# Patient Record
Sex: Female | Born: 1950 | Race: Black or African American | Hispanic: No | Marital: Single | Smoking: Never smoker
Health system: Southern US, Community
[De-identification: ages and names within clinical notes are randomized; demographics above are authoritative.]

## PROBLEM LIST (undated history)

## (undated) DIAGNOSIS — J302 Other seasonal allergic rhinitis: Secondary | ICD-10-CM

## (undated) DIAGNOSIS — I10 Essential (primary) hypertension: Secondary | ICD-10-CM

## (undated) DIAGNOSIS — E669 Obesity, unspecified: Secondary | ICD-10-CM

## (undated) DIAGNOSIS — I4891 Unspecified atrial fibrillation: Secondary | ICD-10-CM

## (undated) HISTORY — PX: ABLATION: SHX5711

---

## 2011-07-05 DIAGNOSIS — R002 Palpitations: Secondary | ICD-10-CM | POA: Insufficient documentation

## 2011-07-05 DIAGNOSIS — I471 Supraventricular tachycardia: Secondary | ICD-10-CM | POA: Insufficient documentation

## 2011-07-05 DIAGNOSIS — I4510 Unspecified right bundle-branch block: Secondary | ICD-10-CM | POA: Insufficient documentation

## 2011-09-21 ENCOUNTER — Emergency Department (HOSPITAL_COMMUNITY)
Admission: EM | Admit: 2011-09-21 | Discharge: 2011-09-21 | Disposition: A | Discharge status: Home / Self Care | Payer: Self-pay | Attending: Emergency Medicine | Admitting: Emergency Medicine

## 2011-09-21 ENCOUNTER — Encounter (HOSPITAL_COMMUNITY): Payer: Self-pay | Admitting: *Deleted

## 2011-09-21 ENCOUNTER — Emergency Department (HOSPITAL_COMMUNITY): Payer: Self-pay

## 2011-09-21 DIAGNOSIS — I4891 Unspecified atrial fibrillation: Secondary | ICD-10-CM | POA: Insufficient documentation

## 2011-09-21 DIAGNOSIS — Z7982 Long term (current) use of aspirin: Secondary | ICD-10-CM | POA: Insufficient documentation

## 2011-09-21 DIAGNOSIS — R6889 Other general symptoms and signs: Secondary | ICD-10-CM | POA: Insufficient documentation

## 2011-09-21 DIAGNOSIS — R0989 Other specified symptoms and signs involving the circulatory and respiratory systems: Secondary | ICD-10-CM

## 2011-09-21 HISTORY — DX: Unspecified atrial fibrillation: I48.91

## 2011-09-21 NOTE — ED Provider Notes (Signed)
History    Scribed for Cyndra Numbers, MD, the patient was seen in room STRE3/STRE3. This chart was scribed by Kayla Preston.   CSN: [id number]  Arrival date & time 09/21/11  1651   First MD Initiated Contact with Patient 09/21/11 1713      Chief Complaint  Patient presents with  . Choking    (Consider location/radiation/quality/duration/timing/severity/associated sxs/prior treatment) HPI Cyndra Numbers, MD entered patient's room at 5:49 PM   Kayla Preston is a 61 y.o. female who presents to the Emergency Department complaining of persistent choking sensation.  Patient ate fish and grits today and believe she has a fishbone in her throat.  Patient reports feeling "pins" while swallowing.   Patient ate bread and has been drinking fluids.  Symptoms are not associated with abdominal pain.   Episode has not changed.  Patient has no other complaints.  Patient reports pain is a 3/10.    Past Medical History  Diagnosis Date  . Atrial fibrillation     History reviewed. No pertinent past surgical history.  History reviewed. No pertinent family history.  History  Substance Use Topics  . Smoking status: Never Smoker   . Smokeless tobacco: Not on file  . Alcohol Use: No    OB History    Grav Para Term Preterm Abortions TAB SAB Ect Mult Living                  Review of Systems  Constitutional: Negative.   HENT:       Foreign body sensation and pain in throat.  Eyes: Negative.   Respiratory: Negative.   Cardiovascular: Negative.   Gastrointestinal: Negative.   Genitourinary: Negative.   Musculoskeletal: Negative.   Skin: Negative.   Neurological: Negative.   Hematological: Negative.   Psychiatric/Behavioral: Negative.   All other systems reviewed and are negative.     Allergies  Review of patient's allergies indicates no known allergies.  Home Medications   Current Outpatient Rx  Name Route Sig Dispense Refill  . ASPIRIN EC 81 MG PO TBEC Oral Take 81 mg by  mouth daily.    . ADULT MULTIVITAMIN W/MINERALS CH Oral Take 1 tablet by mouth daily.      BP 153/90  Pulse 71  Temp(Src) 97 F (36.1 C) (Oral)  Resp 18  SpO2 97%  Physical Exam  Nursing note and vitals reviewed. Constitutional: She is oriented to person, place, and time. She appears well-developed and well-nourished. No distress.  HENT:  Head: Normocephalic and atraumatic.  Eyes: EOM are normal.  Neck: Neck supple. No tracheal deviation present.  Cardiovascular: Normal rate and regular rhythm.   Pulmonary/Chest: Effort normal. No respiratory distress.  Abdominal: Bowel sounds are normal. There is no tenderness. There is no rebound and no guarding.  Musculoskeletal: Normal range of motion.  Neurological: She is alert and oriented to person, place, and time.  Skin: Skin is warm and dry.  Psychiatric: She has a normal mood and affect. Her behavior is normal.    ED Course  Procedures (including critical care time)    DIAGNOSTIC STUDIES: Oxygen Saturation is 98% on room air, normal by my interpretation.     COORDINATION OF CARE: 5:35 PM  Will xray neck soft tissue.     LABS / RADIOLOGY:   Labs Reviewed - No data to display Dg Neck Soft Tissue  09/21/2011  *RADIOLOGY REPORT*  Clinical Data: Feels like a fish bone is stuck in throat  NECK SOFT TISSUES - 1+ VIEW  Comparison: None.  Findings: A lateral soft tissue view of the neck shows the cervical vertebrae to be straightened with degenerative disc disease at C5-6 and C6-7 levels.  No prevertebral soft tissue swelling is seen. There is a linear calcific density just anterior to the C4-5 level. This is most consistent with normal variation of calcification of the arytenoid cartilage.  No definite opaque foreign body is seen.  IMPRESSION: No opaque foreign body.  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  Original Report Authenticated By: Juline Patch, M.D.         MDM  Patient with negative x-ray and  feeling better.  Maintaining airway and tolerating po.  Notified sensation is likely due to small scratch from bone rather than bone.  PAtient discharged in good condition and comfortable with plan.      MEDICATIONS GIVEN IN THE E.D. Scheduled Meds:   Continuous Infusions:       IMPRESSION: 1. Foreign body sensation in throat      NEW MEDICATIONS: New Prescriptions   No medications on file      I personally performed the services described in this documentation, which was scribed in my presence. The recorded information has been reviewed and considered.        Cyndra Numbers, MD 09/21/11 2200

## 2011-09-21 NOTE — ED Notes (Signed)
Did eat a half of loaf of bread and banana but not helpful.

## 2011-09-21 NOTE — ED Notes (Signed)
Had fish with grits. When she ate her dinner, all of chicken came out except a piece fish bone.

## 2012-05-17 ENCOUNTER — Encounter (HOSPITAL_COMMUNITY): Payer: Self-pay | Admitting: *Deleted

## 2012-05-17 ENCOUNTER — Emergency Department (HOSPITAL_COMMUNITY)
Admission: EM | Admit: 2012-05-17 | Discharge: 2012-05-17 | Disposition: A | Discharge status: Home / Self Care | Payer: Self-pay | Attending: Emergency Medicine | Admitting: Emergency Medicine

## 2012-05-17 DIAGNOSIS — R05 Cough: Secondary | ICD-10-CM | POA: Insufficient documentation

## 2012-05-17 DIAGNOSIS — R059 Cough, unspecified: Secondary | ICD-10-CM | POA: Insufficient documentation

## 2012-05-17 DIAGNOSIS — I4891 Unspecified atrial fibrillation: Secondary | ICD-10-CM | POA: Insufficient documentation

## 2012-05-17 DIAGNOSIS — Z7982 Long term (current) use of aspirin: Secondary | ICD-10-CM | POA: Insufficient documentation

## 2012-05-17 DIAGNOSIS — J00 Acute nasopharyngitis [common cold]: Secondary | ICD-10-CM | POA: Insufficient documentation

## 2012-05-17 DIAGNOSIS — R0789 Other chest pain: Secondary | ICD-10-CM | POA: Insufficient documentation

## 2012-05-17 MED ORDER — DEXTROMETHORPHAN POLISTIREX 30 MG/5ML PO LQCR: 60.0000 mg | ORAL | Status: DC | PRN

## 2012-05-17 MED ORDER — ALBUTEROL SULFATE HFA 108 (90 BASE) MCG/ACT IN AERS
2.0000 | INHALATION_SPRAY | RESPIRATORY_TRACT | Status: DC | PRN
  Administered 2012-05-17: 2 via RESPIRATORY_TRACT
  Filled 2012-05-17: qty 6.7

## 2012-05-17 NOTE — ED Notes (Signed)
Pt reports non productive cough x 3 weeks, no relief with otc meds. Airway intact at triage.

## 2012-05-17 NOTE — ED Provider Notes (Signed)
History   This chart was scribed for non-physician practitioner working with Juliet Rude. Rubin Payor, MD by Gerlean Ren, ED Scribe. This patient was seen in room TR10C/TR10C and the patient's care was started at 3:17 PM.      CSN: [id number]  Arrival date & time 05/17/12  1407   First MD Initiated Contact with Patient 05/17/12 1501      Chief Complaint  Patient presents with  . Cough    The history is provided by the patient. No language interpreter was used.   Kayla Preston is a 62 y.o. female with h/o atrial fibrillation who presents to the Emergency Department complaining of 3 days of constant dry cough with coughing spells at night with associated fatigue and chest tightness.  Pt reports fever, sinus drainage, and nasal drainage that were all present approximately one week ago but have resolved without medical attention and are not currently present.  Pt denies tobacco and alcohol use.   Past Medical History  Diagnosis Date  . Atrial fibrillation     History reviewed. No pertinent past surgical history.  History reviewed. No pertinent family history.  History  Substance Use Topics  . Smoking status: Never Smoker   . Smokeless tobacco: Not on file  . Alcohol Use: No    No OB history provided.   Review of Systems  Constitutional: Negative for fever and diaphoresis.  HENT: Negative for postnasal drip and sinus pressure.   Respiratory: Positive for cough and chest tightness. Negative for shortness of breath.   Cardiovascular: Negative for chest pain.    Allergies  Review of patient's allergies indicates no known allergies.  Home Medications   Current Outpatient Rx  Name  Route  Sig  Dispense  Refill  . ASPIRIN EC 81 MG PO TBEC   Oral   Take 81 mg by mouth daily.         . ADULT MULTIVITAMIN W/MINERALS CH   Oral   Take 1 tablet by mouth daily.         Ronney Asters COLD/FLU SEVERE PO   Oral   Take 1 tablet by mouth once.           BP 144/101  Pulse 101   Temp 98.2 F (36.8 C) (Oral)  Resp 20  SpO2 98%  Physical Exam  Nursing note and vitals reviewed. Constitutional: She is oriented to person, place, and time. She appears well-developed and well-nourished. No distress.  HENT:  Head: Normocephalic and atraumatic.       Bilateral TM clear, no signs of infection, sinuses non-tender to palpation  Eyes: Conjunctivae normal and EOM are normal.  Neck: Neck supple. No tracheal deviation present.  Cardiovascular: Normal rate, regular rhythm and normal heart sounds.   No murmur heard. Pulmonary/Chest: Effort normal and breath sounds normal. No respiratory distress. She has no wheezes.  Abdominal: She exhibits no distension.  Musculoskeletal: Normal range of motion.  Neurological: She is alert and oriented to person, place, and time.  Skin: Skin is warm and dry.  Psychiatric: She has a normal mood and affect. Her behavior is normal.    ED Course  Procedures (including critical care time) DIAGNOSTIC STUDIES: Oxygen Saturation is 98% on room air, normal by my interpretation.    COORDINATION OF CARE: 3:23 PM-  Informed pt of probable bronchitis.  Prescribed Delsym for cough and discussed providing albuterol inhaler to be used during coughing spells.  Discussed returning to ED if fever exceeds 101 or if cough begins  producing increasing amounts of mucous.  Pt understands and agrees with plan.   1. Cough   2. Common cold       MDM  Flu vs common cold, with resolving symptoms.  I personally performed the services described in this documentation, which was scribed in my presence. The recorded information has been reviewed and is accurate.         Roxy Horseman, PA-C 05/17/12 2113

## 2012-05-17 NOTE — ED Provider Notes (Signed)
Medical screening examination/treatment/procedure(s) were performed by non-physician practitioner and as supervising physician I was immediately available for consultation/collaboration.  Juliet Rude. Rubin Payor, MD 05/17/12 2332

## 2013-08-18 ENCOUNTER — Encounter (HOSPITAL_COMMUNITY): Payer: Self-pay | Admitting: Emergency Medicine

## 2013-08-18 DIAGNOSIS — J029 Acute pharyngitis, unspecified: Secondary | ICD-10-CM | POA: Insufficient documentation

## 2013-08-18 DIAGNOSIS — R05 Cough: Secondary | ICD-10-CM | POA: Insufficient documentation

## 2013-08-18 DIAGNOSIS — J3489 Other specified disorders of nose and nasal sinuses: Secondary | ICD-10-CM | POA: Insufficient documentation

## 2013-08-18 DIAGNOSIS — R51 Headache: Secondary | ICD-10-CM | POA: Insufficient documentation

## 2013-08-18 DIAGNOSIS — R0789 Other chest pain: Secondary | ICD-10-CM | POA: Insufficient documentation

## 2013-08-18 DIAGNOSIS — R059 Cough, unspecified: Secondary | ICD-10-CM | POA: Insufficient documentation

## 2013-08-18 DIAGNOSIS — H9209 Otalgia, unspecified ear: Secondary | ICD-10-CM | POA: Insufficient documentation

## 2013-08-18 NOTE — ED Notes (Signed)
Pt c/o sinus pain, drainage, ear aches, sore throat, coughing

## 2013-08-19 ENCOUNTER — Emergency Department (HOSPITAL_COMMUNITY)
Admission: EM | Admit: 2013-08-19 | Discharge: 2013-08-19 | Disposition: A | Discharge status: Home / Self Care | Payer: BC Managed Care – PPO | Attending: Emergency Medicine | Admitting: Emergency Medicine

## 2013-08-19 ENCOUNTER — Emergency Department (HOSPITAL_COMMUNITY)
Admission: EM | Admit: 2013-08-19 | Discharge: 2013-08-19 | Discharge status: Against Medical Advice | Payer: BC Managed Care – PPO | Attending: Emergency Medicine | Admitting: Emergency Medicine

## 2013-08-19 ENCOUNTER — Encounter (HOSPITAL_COMMUNITY): Payer: Self-pay | Admitting: Emergency Medicine

## 2013-08-19 DIAGNOSIS — Z8679 Personal history of other diseases of the circulatory system: Secondary | ICD-10-CM | POA: Insufficient documentation

## 2013-08-19 DIAGNOSIS — J029 Acute pharyngitis, unspecified: Secondary | ICD-10-CM | POA: Insufficient documentation

## 2013-08-19 DIAGNOSIS — J309 Allergic rhinitis, unspecified: Secondary | ICD-10-CM | POA: Insufficient documentation

## 2013-08-19 DIAGNOSIS — Z7982 Long term (current) use of aspirin: Secondary | ICD-10-CM | POA: Insufficient documentation

## 2013-08-19 DIAGNOSIS — E669 Obesity, unspecified: Secondary | ICD-10-CM | POA: Insufficient documentation

## 2013-08-19 HISTORY — DX: Obesity, unspecified: E66.9

## 2013-08-19 HISTORY — DX: Other seasonal allergic rhinitis: J30.2

## 2013-08-19 MED ORDER — HYDROCOD POLST-CHLORPHEN POLST 10-8 MG/5ML PO LQCR: 5.0000 mL | Freq: Two times a day (BID) | ORAL | Status: DC

## 2013-08-19 NOTE — ED Notes (Signed)
Pt states that she has seasonal allergies and she has sinus congestion with post nasal drip and cough and cold.  No fever or chills, no sob

## 2013-08-19 NOTE — ED Notes (Signed)
Onset 08-15-13 nasal congestion, forehead pressure, post nasal drip, hoarseness and cough.  Pt has been using Tylenol sinus severe, Zyrtec and another generic sinus medication.  Took Zyrtec 10 mg @ 5am.  Zyrtec usually lasts 4-5 hours then wears off.  No other s/s noted.

## 2013-08-19 NOTE — Discharge Instructions (Signed)
Allergic Rhinitis Allergic rhinitis is when the mucous membranes in the nose respond to allergens. Allergens are particles in the air that cause your body to have an allergic reaction. This causes you to release allergic antibodies. Through a chain of events, these eventually cause you to release histamine into the blood stream. Although meant to protect the body, it is this release of histamine that causes your discomfort, such as frequent sneezing, congestion, and an itchy, runny nose.  CAUSES  Seasonal allergic rhinitis (hay fever) is caused by pollen allergens that may come from grasses, trees, and weeds. Year-round allergic rhinitis (perennial allergic rhinitis) is caused by allergens such as house dust mites, pet dander, and mold spores.  SYMPTOMS   Nasal stuffiness (congestion).  Itchy, runny nose with sneezing and tearing of the eyes. DIAGNOSIS  Your health care provider can help you determine the allergen or allergens that trigger your symptoms. If you and your health care provider are unable to determine the allergen, skin or blood testing may be used. TREATMENT  Allergic Rhinitis does not have a cure, but it can be controlled by:  Medicines and allergy shots (immunotherapy).  Avoiding the allergen. Hay fever may often be treated with antihistamines in pill or nasal spray forms. Antihistamines block the effects of histamine. There are over-the-counter medicines that may help with nasal congestion and swelling around the eyes. Check with your health care provider before taking or giving this medicine.  If avoiding the allergen or the medicine prescribed do not work, there are many new medicines your health care provider can prescribe. Stronger medicine may be used if initial measures are ineffective. Desensitizing injections can be used if medicine and avoidance does not work. Desensitization is when a patient is given ongoing shots until the body becomes less sensitive to the allergen.  Make sure you follow up with your health care provider if problems continue. HOME CARE INSTRUCTIONS It is not possible to completely avoid allergens, but you can reduce your symptoms by taking steps to limit your exposure to them. It helps to know exactly what you are allergic to so that you can avoid your specific triggers. SEEK MEDICAL CARE IF:   You have a fever.  You develop a cough that does not stop easily (persistent).  You have shortness of breath.  You start wheezing.  Symptoms interfere with normal daily activities. Document Released: 01/19/2001 Document Revised: 02/14/2013 Document Reviewed: 01/01/2013 [employer] Patient Information 2014 [employer]  

## 2013-08-19 NOTE — ED Provider Notes (Signed)
CSN: [id number]     Arrival date & time 08/19/13  1708 History  This chart was scribed for Felicie Mornavid Wesly Whisenant, NP working with Glynn OctaveStephen Rancour, MD by Quintella ReichertMatthew Underwood, ED Scribe. This patient was seen in room TR04C/TR04C and the patient's care was started at 7:20 PM.   Chief Complaint  Patient presents with  . Nasal Congestion    Patient is a 63 y.o. female presenting with cough. The history is provided by the patient. No language interpreter was used.  Cough Severity:  Moderate Duration: several days. Timing: persistent. Progression:  Worsening Chronicity:  Recurrent Context: exposure to allergens   Relieved by: neti pot, zyrtec. Associated symptoms: headaches, rhinorrhea, sinus congestion and sore throat (scratchy)   Associated symptoms: no fever   Risk factors: no recent travel     HPI Comments: Kayla CuffDoris Acree is a 63 y.o. female who presents to the Emergency Department complaining of several days of persistent worsening sinus pressure with associated nasal congestion, headache, rhinorrhea with clear mucus, eye itching, cough, and scratchy throat.  Pt reports temporary relief from neti pot and Zyrtec but states she only took 3 zyrtec tablets.  Cough worse at night.  She notes some blood-tinged sputum after coughing a lot.  She denies measured fever.  She notes she has been walking outside a lot recently.  She has h/o seasonal allergies and attributes her symptoms to pollen.   Past Medical History  Diagnosis Date  . Atrial fibrillation   . Seasonal allergies   . Atrial fibrillation     she had ablation which resolved intermittent afib  . Obesity     Past Surgical History  Procedure Laterality Date  . Ablation      No family history on file.   History  Substance Use Topics  . Smoking status: Never Smoker   . Smokeless tobacco: Not on file  . Alcohol Use: No    OB History   Grav Para Term Preterm Abortions TAB SAB Ect Mult Living                   Review of Systems   Constitutional: Negative for fever.  HENT: Positive for congestion, rhinorrhea, sinus pressure and sore throat (scratchy).   Eyes: Positive for itching.  Respiratory: Positive for cough.   Neurological: Positive for headaches.  All other systems reviewed and are negative.     Allergies  Review of patient's allergies indicates no known allergies.  Home Medications   Current Outpatient Rx  Name  Route  Sig  Dispense  Refill  . aspirin EC 81 MG tablet   Oral   Take 81 mg by mouth daily.         . Multiple Vitamin (MULITIVITAMIN WITH MINERALS) TABS   Oral   Take 1 tablet by mouth daily.         . Multiple Vitamins-Minerals (ECHINACEA ACZ PO)   Oral   Take 1 capsule by mouth daily.          BP 169/104  Pulse 104  Temp(Src) 98.7 F (37.1 C) (Oral)  Resp 20  Ht 5\' 6"  (1.676 m)  Wt 209 lb (94.802 kg)  BMI 33.75 kg/m2  SpO2 100%  Physical Exam  Nursing note and vitals reviewed. Constitutional: She is oriented to person, place, and time. She appears well-developed and well-nourished. No distress.  HENT:  Head: Normocephalic and atraumatic.  Right Ear: Tympanic membrane normal.  Left Ear: Tympanic membrane normal.  Nose: Mucosal edema (bilaterally) present.  Mouth/Throat: Oropharynx is clear and moist. No oropharyngeal exudate, posterior oropharyngeal edema or posterior oropharyngeal erythema.  Eyes: EOM are normal.  Neck: Neck supple. No tracheal deviation present.  Cardiovascular: Normal rate, regular rhythm and normal heart sounds.   No murmur heard. Pulmonary/Chest: Effort normal and breath sounds normal. No respiratory distress. She has no wheezes. She has no rales.  Musculoskeletal: Normal range of motion.  Lymphadenopathy:    She has no cervical adenopathy.  Neurological: She is alert and oriented to person, place, and time.  Skin: Skin is warm and dry.  Psychiatric: She has a normal mood and affect. Her behavior is normal.    ED Course  Procedures  (including critical care time)  DIAGNOSTIC STUDIES: Oxygen Saturation is 100% on room air, normal by my interpretation.    COORDINATION OF CARE: 7:22 PM-Discussed treatment plan which includes Zyrtec and Tussionex  with pt at bedside and pt agreed to plan.     Labs Review Labs Reviewed - No data to display  Imaging Review No results found.   EKG Interpretation None      MDM   Final diagnoses:  Allergic rhinitis    Cough.  Allergic rhinitis.    I personally performed the services described in this documentation, which was scribed in my presence. The recorded information has been reviewed and is accurate.    Jimmye Norman, NP 08/20/13 [phone]

## 2013-08-19 NOTE — ED Notes (Signed)
Pt states she has chest discomfort with coughing, sever sinus headache. States she has been outside a lot walking. Been taking tylenol sinus without relief.

## 2013-08-20 NOTE — ED Provider Notes (Signed)
Medical screening examination/treatment/procedure(s) were performed by non-physician practitioner and as supervising physician I was immediately available for consultation/collaboration.   EKG Interpretation None       Quentyn Kolbeck, MD 08/20/13 1053 

## 2014-12-05 ENCOUNTER — Encounter (HOSPITAL_COMMUNITY): Payer: Self-pay | Admitting: *Deleted

## 2014-12-05 ENCOUNTER — Emergency Department (INDEPENDENT_AMBULATORY_CARE_PROVIDER_SITE_OTHER)
Admission: EM | Admit: 2014-12-05 | Discharge: 2014-12-05 | Disposition: A | Discharge status: Home / Self Care | Payer: BLUE CROSS/BLUE SHIELD | Source: Home / Self Care | Attending: Family Medicine | Admitting: Family Medicine

## 2014-12-05 DIAGNOSIS — L2489 Irritant contact dermatitis due to other agents: Secondary | ICD-10-CM

## 2014-12-05 NOTE — Discharge Instructions (Signed)
Wash regularly, return or see your doctor if further problems.

## 2014-12-05 NOTE — ED Notes (Signed)
PT   STATES  6 DAYS  AGO WHILE  AT  WORK  SHE  DEVELOPED  A  RASH  AND HAD  SOME  ITCHING  AFTER WHAT SHE  DESCRIBES  AS  AN  UNKNOWN  EXPOSURE    TO         REFUSE     BEING  CARRIED  OUT BY  AN  EMPLOYEE  SHE  IS   BETTER     NOW  AND  HAS  NO  SYMPTOMS  SHE  JUST  WANTS  TO BE  CHECKED   SHE  HAD     A  TET  AND  SHINGLES  SHOT  AS   WELL

## 2014-12-05 NOTE — ED Provider Notes (Signed)
CSN: [id number]     Arrival date & time 12/05/14  1412 History   First MD Initiated Contact with Patient 12/05/14 1621     Chief Complaint  Patient presents with  . Follow-up   (Consider location/radiation/quality/duration/timing/severity/associated sxs/prior Treatment) Patient is a 64 y.o. female presenting with rash. The history is provided by the patient.  Rash Location:  Torso (expsoure to trash at work that may have caused rash but all is resolved now.) Torso rash location:  R chest and L chest Quality: itchiness   Severity:  Mild Onset quality:  Gradual Duration:  6 days Progression:  Resolved Chronicity:  New Relieved by:  None tried Worsened by:  Nothing tried Ineffective treatments:  None tried Associated symptoms: no abdominal pain, no fever, no throat swelling and no tongue swelling     Past Medical History  Diagnosis Date  . Atrial fibrillation   . Seasonal allergies   . Atrial fibrillation     she had ablation which resolved intermittent afib  . Obesity    Past Surgical History  Procedure Laterality Date  . Ablation     History reviewed. No pertinent family history. History  Substance Use Topics  . Smoking status: Never Smoker   . Smokeless tobacco: Not on file  . Alcohol Use: No   OB History    No data available     Review of Systems  Constitutional: Negative.  Negative for fever.  Respiratory: Negative.   Cardiovascular: Negative.   Gastrointestinal: Negative for abdominal pain.  Skin: Positive for rash.    Allergies  Review of patient's allergies indicates no known allergies.  Home Medications   Prior to Admission medications   Medication Sig Start Date End Date Taking? Authorizing Provider  aspirin EC 81 MG tablet Take 81 mg by mouth daily.    Historical Provider, MD  chlorpheniramine-HYDROcodone (TUSSIONEX PENNKINETIC ER) 10-8 MG/5ML LQCR Take 5 mLs by mouth 2 (two) times daily. 08/19/13   Felicie Morn, NP  Multiple Vitamin  (MULITIVITAMIN WITH MINERALS) TABS Take 1 tablet by mouth daily.    Historical Provider, MD  Multiple Vitamins-Minerals (ECHINACEA ACZ PO) Take 1 capsule by mouth daily.    Historical Provider, MD   BP 138/98 mmHg  Pulse 92  Temp(Src) 97.6 F (36.4 C) (Oral)  Resp 16  SpO2 97% Physical Exam  Constitutional: She is oriented to person, place, and time. She appears well-developed and well-nourished. No distress.  Cardiovascular: Normal rate, normal heart sounds and intact distal pulses.   Pulmonary/Chest: Effort normal and breath sounds normal.  Neurological: She is alert and oriented to person, place, and time.  Skin: Skin is warm and dry. No rash noted.  Nursing note and vitals reviewed.   ED Course  Procedures (including critical care time) Labs Review Labs Reviewed - No data to display  Imaging Review No results found.   MDM   1. Irritant contact dermatitis due to multiple agents    Pt reassured of no serious effect from exposure.    Linna Hoff, MD 12/05/14 [phone]

## 2015-02-07 ENCOUNTER — Emergency Department (HOSPITAL_COMMUNITY)
Admission: EM | Admit: 2015-02-07 | Discharge: 2015-02-07 | Disposition: A | Discharge status: Home / Self Care | Payer: BLUE CROSS/BLUE SHIELD | Attending: Emergency Medicine | Admitting: Emergency Medicine

## 2015-02-07 ENCOUNTER — Encounter (HOSPITAL_COMMUNITY): Payer: Self-pay | Admitting: *Deleted

## 2015-02-07 ENCOUNTER — Emergency Department (HOSPITAL_COMMUNITY): Payer: BLUE CROSS/BLUE SHIELD

## 2015-02-07 DIAGNOSIS — Z79899 Other long term (current) drug therapy: Secondary | ICD-10-CM | POA: Diagnosis not present

## 2015-02-07 DIAGNOSIS — Z7982 Long term (current) use of aspirin: Secondary | ICD-10-CM | POA: Diagnosis not present

## 2015-02-07 DIAGNOSIS — R42 Dizziness and giddiness: Secondary | ICD-10-CM | POA: Diagnosis present

## 2015-02-07 DIAGNOSIS — E669 Obesity, unspecified: Secondary | ICD-10-CM | POA: Insufficient documentation

## 2015-02-07 LAB — CBC WITH DIFFERENTIAL/PLATELET
BASOS ABS: 0 10*3/uL (ref 0.0–0.1)
BASOS PCT: 0 %
EOS PCT: 1 %
Eosinophils Absolute: 0.1 10*3/uL (ref 0.0–0.7)
HCT: 41.4 % (ref 36.0–46.0)
Hemoglobin: 13.1 g/dL (ref 12.0–15.0)
Lymphocytes Relative: 28 %
Lymphs Abs: 1.4 10*3/uL (ref 0.7–4.0)
MCH: 23.1 pg — ABNORMAL LOW (ref 26.0–34.0)
MCHC: 31.6 g/dL (ref 30.0–36.0)
MCV: 73.1 fL — AB (ref 78.0–100.0)
MONO ABS: 0.3 10*3/uL (ref 0.1–1.0)
Monocytes Relative: 6 %
Neutro Abs: 3.2 10*3/uL (ref 1.7–7.7)
Neutrophils Relative %: 65 %
PLATELETS: 234 10*3/uL (ref 150–400)
RBC: 5.66 MIL/uL — ABNORMAL HIGH (ref 3.87–5.11)
RDW: 16.7 % — AB (ref 11.5–15.5)
WBC: 4.9 10*3/uL (ref 4.0–10.5)

## 2015-02-07 LAB — COMPREHENSIVE METABOLIC PANEL
ALBUMIN: 3.9 g/dL (ref 3.5–5.0)
ALT: 27 U/L (ref 14–54)
AST: 25 U/L (ref 15–41)
Alkaline Phosphatase: 79 U/L (ref 38–126)
Anion gap: 8 (ref 5–15)
BUN: 9 mg/dL (ref 6–20)
CHLORIDE: 111 mmol/L (ref 101–111)
CO2: 23 mmol/L (ref 22–32)
Calcium: 9.7 mg/dL (ref 8.9–10.3)
Creatinine, Ser: 0.61 mg/dL (ref 0.44–1.00)
GFR calc Af Amer: >60 mL/min (ref >60)
Glucose, Bld: 107 mg/dL — ABNORMAL HIGH (ref 65–99)
POTASSIUM: 4 mmol/L (ref 3.5–5.1)
Sodium: 142 mmol/L (ref 135–145)
Total Bilirubin: 0.8 mg/dL (ref 0.3–1.2)
Total Protein: 6.9 g/dL (ref 6.5–8.1)

## 2015-02-07 MED ORDER — MECLIZINE HCL 50 MG PO TABS: 50.0000 mg | ORAL_TABLET | Freq: Three times a day (TID) | ORAL | Status: DC | PRN

## 2015-02-07 NOTE — ED Provider Notes (Signed)
CSN: [id number]     Arrival date & time 02/07/15  1023 History   First MD Initiated Contact with Patient 02/07/15 1105     Chief Complaint  Patient presents with  . Dizziness     (Consider location/radiation/quality/duration/timing/severity/associated sxs/prior Treatment) HPI   Kayla Preston is a 64 y.o. female with PMH significant for atrial fibrillation s/p ablation, seasonal allergies, and obesity who presents with sudden episodes of dizziness that began this morning.  Since waking up this morning she has had 2 episodes of dizziness that she describes as the room spinning when she got up from the bed. Associated symptoms include N/V, HA, difficulty walking (states she feels wobbly and off balance), lightheadedness, tinnitus in the right ear and diaphoresis during the episodes.  Denies abdominal pain, unilateral weakness, slurred speech, facial droop, palpitations, CP, syncope, or LOC.  She is not dizzy at this time.   She states she has had a cough x 3 weeks, but it is much improved and relates it to her allergies.    Past Medical History  Diagnosis Date  . Atrial fibrillation   . Seasonal allergies   . Atrial fibrillation     she had ablation which resolved intermittent afib  . Obesity    Past Surgical History  Procedure Laterality Date  . Ablation     History reviewed. No pertinent family history. Social History  Substance Use Topics  . Smoking status: Never Smoker   . Smokeless tobacco: None  . Alcohol Use: No   OB History    No data available     Review of Systems  All other systems negative unless otherwise stated in HPI   Allergies  Review of patient's allergies indicates no known allergies.  Home Medications   Prior to Admission medications   Medication Sig Start Date End Date Taking? Authorizing Provider  aspirin EC 81 MG tablet Take 81 mg by mouth daily.    Historical Provider, MD  chlorpheniramine-HYDROcodone (TUSSIONEX PENNKINETIC ER) 10-8 MG/5ML  LQCR Take 5 mLs by mouth 2 (two) times daily. 08/19/13   Felicie Morn, NP  Multiple Vitamin (MULITIVITAMIN WITH MINERALS) TABS Take 1 tablet by mouth daily.    Historical Provider, MD  Multiple Vitamins-Minerals (ECHINACEA ACZ PO) Take 1 capsule by mouth daily.    Historical Provider, MD   BP 143/81 mmHg  Pulse 68  Temp(Src) 98.9 F (37.2 C) (Oral)  Resp 19  Ht  (1.702 m)  Wt 210 lb (95.255 kg)  BMI 32.88 kg/m2  SpO2 94% Physical Exam  Constitutional: She is oriented to person, place, and time. She appears well-developed and well-nourished.  HENT:  Head: Normocephalic and atraumatic.  Right Ear: Tympanic membrane and external ear normal.  Left Ear: Tympanic membrane and external ear normal.  Mouth/Throat: Oropharynx is clear and moist.  Eyes: Pupils are equal, round, and reactive to light.  Neck: Normal range of motion. Neck supple.  Cardiovascular: Normal rate, regular rhythm and intact distal pulses.   No murmur heard. Pulmonary/Chest: Effort normal and breath sounds normal. No respiratory distress. She has no wheezes. She has no rales.  Abdominal: Soft. Bowel sounds are normal. She exhibits no distension. There is no tenderness.  Musculoskeletal: Normal range of motion.  Lymphadenopathy:    She has no cervical adenopathy.  Neurological: She is alert and oriented to person, place, and time.  Mental Status:   AOx3 Cranial Nerves:  I-not tested  II-PERRLA  III, IV, VI-EOMs intact  V-temporal and masseter strength  intact  VII-symmetrical facial movements intact, no facial droop  VIII-hearing grossly intact bilaterally  IX, X-gag intact  XI-strength of sternomastoid and trapezius muscles 5/5  XII-tongue midline Motor:   Good muscle bulk and tone  Strength 5/5 bilaterally in upper and lower extremities   Cerebellar--RAMs, finger to nose intact  No pronator drift Sensory:  Intact in upper and lower extremities      Skin: Skin is warm and dry.  Psychiatric: She has  a normal mood and affect. Her behavior is normal.    ED Course  Procedures (including critical care time) Labs Review Labs Reviewed  CBC WITH DIFFERENTIAL/PLATELET  COMPREHENSIVE METABOLIC PANEL    Imaging Review No results found. I have personally reviewed and evaluated these images and lab results as part of my medical decision-making.   EKG Interpretation None      MDM   Final diagnoses:  None    Patient presents with 2 episodes of dizziness this morning described as the room spinning and feeling lightheaded, nauseous, and diaphoretic.  VSS, NAD, non-toxic.  No focal neurological deficits. Heart RRR.  Lungs CTAB.  Right TM normal. She is not orthostatic.  Low suspicion for stroke given non focal neurological findings and intermittent short episodes.  Suspect peripheral etiology for dizziness.  Will treat symptomatically.  Follow up with [facility].     Cheri Fowler, PA-C 02/07/15 1359  Mancel Bale, MD 02/10/15 [phone]

## 2015-02-07 NOTE — ED Provider Notes (Signed)
  Face-to-face evaluation   History: She awoke with dizziness, spinning today.Right ear ringing for 3 weeks. Nausea and vomiting with dizziness. No fever  Physical exam:Alert, nontoxic. No nystagmus. No facial assymetry.  Medical screening examination/treatment/procedure(s) were conducted as a shared visit with non-physician practitioner(s) and myself.  I personally evaluated the patient during the encounter  Mancel Bale, MD 02/10/15 [phone]

## 2015-02-07 NOTE — ED Notes (Signed)
Pt is in stable condition upon d/c and is escorted from ED via wheelchair. 

## 2015-02-07 NOTE — Discharge Instructions (Signed)
Dizziness   Dizziness means you feel unsteady or lightheaded. You might feel like you are going to pass out (faint).  HOME CARE   · Drink enough fluids to keep your pee (urine) clear or pale yellow.  · Take your medicines exactly as told by your doctor. If you take blood pressure medicine, always stand up slowly from the lying or sitting position. Hold on to something to steady yourself.  · If you need to stand in one place for a long time, move your legs often. Tighten and relax your leg muscles.  · Have someone stay with you until you feel okay.  · Do not drive or use heavy machinery if you feel dizzy.  · Do not drink alcohol.  GET HELP RIGHT AWAY IF:   · You feel dizzy or lightheaded and it gets worse.  · You feel sick to your stomach (nauseous), or you throw up (vomit).  · You have trouble talking or walking.  · You feel weak or have trouble using your arms, hands, or legs.  · You cannot think clearly or have trouble forming sentences.  · You have chest pain, belly (abdominal) pain, sweating, or you are short of breath.  · Your vision changes.  · You are bleeding.  · You have problems from your medicine that seem to be getting worse.  MAKE SURE YOU:   · Understand these instructions.  · Will watch your condition.  · Will get help right away if you are not doing well or get worse.  Document Released: 04/15/2011 Document Revised: 07/19/2011 Document Reviewed: 04/15/2011  [employer]® Patient Information ©2015 [employer]. This information is not intended to replace advice given to you by your health care provider. Make sure you discuss any questions you have with your health care provider.

## 2015-02-07 NOTE — ED Notes (Signed)
Pt arrives from home. Pt states that she has been having congestion and a productive cough for about 1 week. Pt also endorses dizziness and sob for the past 2 days. Pt states sob worsens upon exertion. Pt states she began coughing so hard today she had 1 episode of vomiting.

## 2015-02-07 NOTE — ED Notes (Signed)
Patient transported to X-ray 

## 2015-02-12 ENCOUNTER — Ambulatory Visit: Payer: BLUE CROSS/BLUE SHIELD | Attending: Family Medicine

## 2015-02-21 ENCOUNTER — Inpatient Hospital Stay: Payer: BLUE CROSS/BLUE SHIELD | Admitting: Family Medicine

## 2015-05-11 ENCOUNTER — Emergency Department (HOSPITAL_COMMUNITY): Payer: BLUE CROSS/BLUE SHIELD

## 2015-05-11 ENCOUNTER — Emergency Department (HOSPITAL_COMMUNITY)
Admission: EM | Admit: 2015-05-11 | Discharge: 2015-05-11 | Disposition: A | Discharge status: Home / Self Care | Payer: BLUE CROSS/BLUE SHIELD | Attending: Emergency Medicine | Admitting: Emergency Medicine

## 2015-05-11 ENCOUNTER — Encounter (HOSPITAL_COMMUNITY): Payer: Self-pay | Admitting: *Deleted

## 2015-05-11 DIAGNOSIS — R52 Pain, unspecified: Secondary | ICD-10-CM | POA: Diagnosis present

## 2015-05-11 DIAGNOSIS — Z79899 Other long term (current) drug therapy: Secondary | ICD-10-CM | POA: Insufficient documentation

## 2015-05-11 DIAGNOSIS — Z7982 Long term (current) use of aspirin: Secondary | ICD-10-CM | POA: Diagnosis not present

## 2015-05-11 DIAGNOSIS — J069 Acute upper respiratory infection, unspecified: Secondary | ICD-10-CM | POA: Insufficient documentation

## 2015-05-11 DIAGNOSIS — E669 Obesity, unspecified: Secondary | ICD-10-CM | POA: Insufficient documentation

## 2015-05-11 MED ORDER — IBUPROFEN 800 MG PO TABS: 800.0000 mg | ORAL_TABLET | Freq: Three times a day (TID) | ORAL | Status: DC

## 2015-05-11 MED ORDER — IBUPROFEN 800 MG PO TABS
800.0000 mg | ORAL_TABLET | Freq: Once | ORAL | Status: AC
  Administered 2015-05-11: 800 mg via ORAL
  Filled 2015-05-11: qty 1

## 2015-05-11 MED ORDER — GUAIFENESIN ER 600 MG PO TB12: 600.0000 mg | ORAL_TABLET | Freq: Two times a day (BID) | ORAL | Status: DC

## 2015-05-11 MED ORDER — BENZONATATE 100 MG PO CAPS: 100.0000 mg | ORAL_CAPSULE | Freq: Three times a day (TID) | ORAL | Status: DC

## 2015-05-11 NOTE — ED Notes (Signed)
The pt has had total body aches with stiffness in her jolints.  Cough non-productive  Chills unknown temp she has been out-of town

## 2015-05-11 NOTE — ED Provider Notes (Signed)
CSN: [id number]     Arrival date & time 05/11/15  4098 History   First MD Initiated Contact with Patient 05/11/15 [phone]     Chief Complaint  Patient presents with  . Generalized Body Aches    HPI   Kayla Preston is an 65 y.o. female who presents to the ED for evaluation of cough, nasal congestion, chills, and body aches. She states her symptoms began five days ago. She endorses a cough that is productive of clear-white sputum but states that it sometimes feels like she can't cough anything up. Pt states she has also had intermittent chills but she is unsure if she has had a fever. She also endorses nasal congestion but this has been relieved with Tylenol Sinus. Pt states she has had diffuse body aches and states sometimes it feels like the pain goes from her muscles into her bones/joints. She states she has tried emergen-C and vitamin B supplements in addition to the Tylenol but none of these have alleviated her body aches. Denies chest pain, SOB. Denies abdominal pain, N/V/D, weakness, lightheadedness.   Past Medical History  Diagnosis Date  . Atrial fibrillation (HCC)   . Seasonal allergies   . Atrial fibrillation (HCC)     she had ablation which resolved intermittent afib  . Obesity    Past Surgical History  Procedure Laterality Date  . Ablation     No family history on file. Social History  Substance Use Topics  . Smoking status: Never Smoker   . Smokeless tobacco: None  . Alcohol Use: No   OB History    No data available     Review of Systems  All other systems reviewed and are negative.     Allergies  Review of patient's allergies indicates no known allergies.  Home Medications   Prior to Admission medications   Medication Sig Start Date End Date Taking? Authorizing Provider  aspirin EC 81 MG tablet Take 81 mg by mouth daily.   Yes Historical Provider, MD  Cyanocobalamin (VITAMIN B 12 PO) Take 1 tablet by mouth daily.   Yes Historical Provider, MD   DM-Phenylephrine-Acetaminophen (THERAFLU WARMING RELIEF DAY PO) Take 1 packet by mouth every 4 (four) hours as needed (for flu symptoms).   Yes Historical Provider, MD  ECHINACEA PO Take 1 tablet by mouth daily.   Yes Historical Provider, MD  Multiple Vitamin (MULITIVITAMIN WITH MINERALS) TABS Take 1 tablet by mouth every other day.    Yes Historical Provider, MD  Multiple Vitamins-Minerals (EMERGEN-C VITAMIN C PO) Take 1 tablet by mouth daily as needed (cold symptoms).   Yes Historical Provider, MD  Phenylephrine-APAP-Guaifenesin (TYLENOL SINUS SEVERE PO) Take 1 tablet by mouth every 4 (four) hours as needed (for pain/congestion).   Yes Historical Provider, MD  benzonatate (TESSALON) 100 MG capsule Take 1 capsule (100 mg total) by mouth every 8 (eight) hours. 05/11/15   Kayla Gins Tressie Ragin, PA-C  chlorpheniramine-HYDROcodone (TUSSIONEX PENNKINETIC ER) 10-8 MG/5ML LQCR Take 5 mLs by mouth 2 (two) times daily. Patient not taking: Reported on 02/07/2015 08/19/13   Felicie Morn, NP  guaiFENesin (MUCINEX) 600 MG 12 hr tablet Take 1 tablet (600 mg total) by mouth 2 (two) times daily. 05/11/15   Kayla Gins Jannett Schmall, PA-C  ibuprofen (ADVIL,MOTRIN) 800 MG tablet Take 1 tablet (800 mg total) by mouth 3 (three) times daily. 05/11/15   Carlene Coria, PA-C  meclizine (ANTIVERT) 50 MG tablet Take 1 tablet (50 mg total) by mouth 3 (three) times daily  as needed for dizziness or nausea. 02/07/15   Kayla Rose, PA-C   BP 103/77 mmHg  Pulse 88  Temp(Src) 98.5 F (36.9 C)  Resp 15  Ht 5\' 6"  (1.676 m)  Wt 93.895 kg  BMI 33.43 kg/m2  SpO2 100% Physical Exam  Constitutional: She is oriented to person, place, and time. No distress.  HENT:  Right Ear: Tympanic membrane and external ear normal.  Left Ear: Tympanic membrane and external ear normal.  Nose: Mucosal edema and rhinorrhea present. Right sinus exhibits no maxillary sinus tenderness and no frontal sinus tenderness. Left sinus exhibits no maxillary sinus tenderness and no frontal  sinus tenderness.  Mouth/Throat: Oropharynx is clear and moist and mucous membranes are normal. No oropharyngeal exudate or posterior oropharyngeal erythema.  Eyes: Conjunctivae and EOM are normal. Pupils are equal, round, and reactive to light.  Neck: Normal range of motion. Neck supple.  Cardiovascular: Normal rate, regular rhythm, normal heart sounds and intact distal pulses.   Pulmonary/Chest: Effort normal and breath sounds normal. No respiratory distress. She has no wheezes. She exhibits no tenderness.  Abdominal: Soft. Bowel sounds are normal. She exhibits no distension. There is no tenderness.  Musculoskeletal: She exhibits no edema.  Lymphadenopathy:    She has no cervical adenopathy.  Neurological: She is alert and oriented to person, place, and time. No cranial nerve deficit.  Skin: Skin is warm and dry. No rash noted. No pallor.  Psychiatric: She has a normal mood and affect.  Nursing note and vitals reviewed.   Filed Vitals:   05/11/15 0545 05/11/15 0722 05/11/15 0723 05/11/15 0805  BP: 116/74 126/75  103/77  Pulse: 81  88 88  Temp: 98.5 F (36.9 C)     Resp: 24   15  Height: 5\' 6"  (1.676 m)     Weight: 93.895 kg     SpO2: 98%  97% 100%    ED Course  Procedures (including critical care time) Labs Review Labs Reviewed - No data to display  Imaging Review Dg Chest 2 View  05/11/2015  CLINICAL DATA:  COUGH, SWEATING, CHILLS, AND BODY ACHES ALL X 6 DAYS. HTN. NONSMOKER EXAM: CHEST  2 VIEW COMPARISON:  02/07/2015 FINDINGS: The heart size and mediastinal contours are within normal limits. Both lungs are clear. The visualized skeletal structures are unremarkable. IMPRESSION: No active cardiopulmonary disease. Electronically Signed   By: Norva PavlovElizabeth  Brown M.D.   On: 05/11/2015 07:42   I have personally reviewed and evaluated these images and lab results as part of my medical decision-making.   EKG Interpretation None      MDM   Final diagnoses:  Upper respiratory  infection    CXR negative. Pt is afebrile and not tachypneic or tachycardic. Her exam and clinical findings are consistent with a likely viral URI. Will give rx for mucinex, tessalon, and ibuprofen. Discussed with pt that she may try the ibuprofen instead of tylenol for her myalgias. Instructed to f/u with PCP within one week. ER return precautions given.     Carlene CoriaSerena Y Lum Stillinger, PA-C 05/11/15 [phone]  Blane OharaJoshua Zavitz, MD 05/13/15 [phone]

## 2015-05-11 NOTE — Discharge Instructions (Signed)
You were seen in the ER for cough, congestion, body aches, and chills. Your chest x-ray was normal. Your symptoms are most likely a bad cold caused by a virus. I will give you several prescriptions to help with your symptoms including ibuprofen (for pain and chills), tessalon (for cough), and mucinex (an expectorant). You may continue the Tylenol you have at home if you find it helpful. Please follow up with your primary care provider within one week. If you do not have one please call one of the clinics below.  Take medications as prescribed. Return to the emergency room for worsening condition or new concerning symptoms. Follow up with your regular doctor. If you don't have a regular doctor use one of the numbers below to establish a primary care doctor.   Emergency Department Resource Guide 1) Find a Doctor and Pay Out of Pocket Although you won't have to find out who is covered by your insurance plan, it is a good idea to ask around and get recommendations. You will then need to call the office and see if the doctor you have chosen will accept you as a new patient and what types of options they offer for patients who are self-pay. Some doctors offer discounts or will set up payment plans for their patients who do not have insurance, but you will need to ask so you aren't surprised when you get to your appointment.  2) Contact Your Local Health Department Not all health departments have doctors that can see patients for sick visits, but many do, so it is worth a call to see if yours does. If you don't know where your local health department is, you can check in your phone book. The CDC also has a tool to help you locate your state's health department, and many state websites also have listings of all of their local health departments.  3) Find a Walk-in Clinic If your illness is not likely to be very severe or complicated, you may want to try a walk in clinic. These are popping up all over the  country in pharmacies, drugstores, and shopping centers. They're usually staffed by nurse practitioners or physician assistants that have been trained to treat common illnesses and complaints. They're usually fairly quick and inexpensive. However, if you have serious medical issues or chronic medical problems, these are probably not your best option.  No Primary Care Doctor: - Call Health Connect at  [phone] - they can help you locate a primary care doctor that  accepts your insurance, provides certain services, etc. - Physician Referral [phone]  Emergency Department Resource Guide 1) Find a Doctor and Pay Out of Pocket Although you won't have to find out who is covered by your insurance plan, it is a good idea to ask around and get recommendations. You will then need to call the office and see if the doctor you have chosen will accept you as a new patient and what types of options they offer for patients who are self-pay. Some doctors offer discounts or will set up payment plans for their patients who do not have insurance, but you will need to ask so you aren't surprised when you get to your appointment.  2) Contact Your Local Health Department Not all health departments have doctors that can see patients for sick visits, but many do, so it is worth a call to see if yours does. If you don't know where your local health department is, you can check in your  phone book. The CDC also has a tool to help you locate your state's health department, and many state websites also have listings of all of their local health departments.  3) Find a Walk-in Clinic If your illness is not likely to be very severe or complicated, you may want to try a walk in clinic. These are popping up all over the country in pharmacies, drugstores, and shopping centers. They're usually staffed by nurse practitioners or physician assistants that have been trained to treat common illnesses and complaints. They're  usually fairly quick and inexpensive. However, if you have serious medical issues or chronic medical problems, these are probably not your best option.  No Primary Care Doctor: - Call Health Connect at  [phone] - they can help you locate a primary care doctor that  accepts your insurance, provides certain services, etc. - Physician Referral Service- [phone]  Chronic Pain Problems: Organization         Address  Phone   Notes  [facility]  [phone] Patients need to be referred by their primary care doctor.   Medication Assistance: Organization         Address  Phone   Notes  [facility] [address] [place], [place] [address] [phone] --Must be a resident of [facility] [place] -- Must have NO insurance coverage whatsoever (no Medicaid/ Medicare, etc.) -- The pt. MUST have a primary care doctor that directs their care regularly and follows them in the community   [facility]ist  [phone]) [phone]   [employer]  [phone]) [phone]    Agencies that provide inexpensive medical care: Organization         Address  Phone   Notes  [facility]  [phone]   [facility]    [phone]   [facility] [address] [place], [place] [address] [phone]   [facility] [address], [place] [phone]   [facility]    [phone]   [facility]    [phone]   [facility]  [address], [place] Phone:  [phone], Fax:  [phone] Hours of Operation:  9 am - 6 pm, M-F.  Also accepts Medicaid/Medicare and self-pay.  [facility]  [address], [place] Phone: [phone]) [phone], Fax: [phone]) [phone]. Hours of Operation:  8:30 am - 5:30 pm, M-F.  Also accepts Medicaid and self-pay.  [facility] [place] [address], [place] Phone:  [phone]) [phone]   [facility] [address], [place], [place] [phone], Ext. [phone] Mondays & Thursdays: 7-9 AM.  First 15 patients are seen on a first come, first serve basis.    Medicaid-accepting [facility] Providers:  Organization         Address  Phone   Notes  [facility] [address], [place] [phone] Also accepts self-pay patients.  [facility] [address], [place]  [phone]) [phone]   [facility] [address], [place] [phone]   [facility] [address] [place] [address], [place] [phone]   [facility] RakersVe[facility] Bland [address], [place]   [phone] Only accepts [place] patients after they have their name applied to their card.  Self-Pay (no insurance) in [facility]:  Organization         Address  Phone   Notes  Sickle Cell Patients, [facility] [address], [place] [phone]   [facility] [address], [place] [phone]   [facility] [place]  [address], [place] [phone]) [phone]   [facility]/Dr. Osei-Bonsu  [address] [place] [address], [place] or [id number], [place] [phone] Phone number for both [place] and [place] locations is the same.  [facility] [address], [place] [phone]   [facility] [place] [address] [place] [address], [place] or [address] [phone]   [facility] [address], [place] [phone], phone; [phone]) [phone], fax Sees patients 1st and 3rd Saturday of every month.  Must not qualify for public or private insurance (i.e. Medicaid, Medicare, [place] Health Choice, Veterans' Benefits)  Household income should be no more than 200% of the poverty level The clinic cannot treat  you if you are pregnant or think you are pregnant  Sexually transmitted diseases are not treated at the clinic.     Upper Respiratory Infection, Adult Most upper respiratory infections (URIs) are a viral infection of the air passages leading to the lungs. A URI affects the nose, throat, and upper air passages. The most common type of URI is nasopharyngitis and is typically referred to as "the common cold." URIs run their course and usually go away on their own. Most of the time, a URI does not require medical attention, but sometimes a bacterial infection in the upper airways can follow a viral infection. This is called a secondary infection. Sinus and middle ear infections are common types of secondary upper respiratory infections. Bacterial pneumonia can also complicate a URI. A URI can worsen asthma and chronic obstructive pulmonary disease (COPD). Sometimes, these complications can require emergency medical care and may be life threatening.  CAUSES Almost all URIs are caused by viruses. A virus is a type of germ and can spread from one person to another.  RISKS FACTORS You may be at risk for a URI if:   You smoke.   You have chronic heart or lung disease.  You have a weakened defense (immune) system.   You are very young or very old.   You have nasal allergies or asthma.  You work in crowded or poorly ventilated areas.  You work in health care facilities or schools. SIGNS AND SYMPTOMS  Symptoms typically develop 2-3 days after you come in contact with a cold virus. Most viral URIs last 7-10 days. However, viral URIs from the influenza virus (flu virus) can last 14-18 days and are typically more severe. Symptoms may include:   Runny or stuffy (congested) nose.   Sneezing.   Cough.   Sore throat.   Headache.   Fatigue.   Fever.   Loss of appetite.   Pain in your forehead, behind your eyes, and over your cheekbones (sinus pain).  Muscle aches.  DIAGNOSIS    Your health care provider may diagnose a URI by:  Physical exam.  Tests to check that your symptoms are not due to another condition such as:  Strep throat.  Sinusitis.  Pneumonia.  Asthma. TREATMENT  A URI goes away on its own with time. It cannot be cured with medicines, but medicines may be prescribed  or recommended to relieve symptoms. Medicines may help:  Reduce your fever.  Reduce your cough.  Relieve nasal congestion. HOME CARE INSTRUCTIONS   Take medicines only as directed by your health care provider.   Gargle warm saltwater or take cough drops to comfort your throat as directed by your health care provider.  Use a warm mist humidifier or inhale steam from a shower to increase air moisture. This may make it easier to breathe.  Drink enough fluid to keep your urine clear or pale yellow.   Eat soups and other clear broths and maintain good nutrition.   Rest as needed.   Return to work when your temperature has returned to normal or as your health care provider advises. You may need to stay home longer to avoid infecting others. You can also use a face mask and careful hand washing to prevent spread of the virus.  Increase the usage of your inhaler if you have asthma.   Do not use any tobacco products, including cigarettes, chewing tobacco, or electronic cigarettes. If you need help quitting, ask your health care provider. PREVENTION  The best way to protect yourself from getting a cold is to practice good hygiene.   Avoid oral or hand contact with people with cold symptoms.   Wash your hands often if contact occurs.  There is no clear evidence that vitamin C, vitamin E, echinacea, or exercise reduces the chance of developing a cold. However, it is always recommended to get plenty of rest, exercise, and practice good nutrition.  SEEK MEDICAL CARE IF:   You are getting worse rather than better.   Your symptoms are not controlled by medicine.   You  have chills.  You have worsening shortness of breath.  You have brown or red mucus.  You have yellow or brown nasal discharge.  You have pain in your face, especially when you bend forward.  You have a fever.  You have swollen neck glands.  You have pain while swallowing.  You have white areas in the back of your throat. SEEK IMMEDIATE MEDICAL CARE IF:   You have severe or persistent:  Headache.  Ear pain.  Sinus pain.  Chest pain.  You have chronic lung disease and any of the following:  Wheezing.  Prolonged cough.  Coughing up blood.  A change in your usual mucus.  You have a stiff neck.  You have changes in your:  Vision.  Hearing.  Thinking.  Mood. MAKE SURE YOU:   Understand these instructions.  Will watch your condition.  Will get help right away if you are not doing well or get worse.   This information is not intended to replace advice given to you by your health care provider. Make sure you discuss any questions you have with your health care provider.   Document Released: 10/20/2000 Document Revised: 09/10/2014 Document Reviewed: 08/01/2013 Elsevier Interactive Patient Education [employer]

## 2015-07-22 ENCOUNTER — Encounter: Payer: Self-pay | Admitting: Internal Medicine

## 2015-07-22 ENCOUNTER — Ambulatory Visit: Payer: BLUE CROSS/BLUE SHIELD | Attending: Internal Medicine | Admitting: Internal Medicine

## 2015-07-22 VITALS — BP 129/86 | HR 96 | Temp 97.5°F | Resp 16 | Ht 66.0 in | Wt 210.2 lb

## 2015-07-22 DIAGNOSIS — Z79899 Other long term (current) drug therapy: Secondary | ICD-10-CM | POA: Diagnosis not present

## 2015-07-22 DIAGNOSIS — R635 Abnormal weight gain: Secondary | ICD-10-CM | POA: Diagnosis not present

## 2015-07-22 DIAGNOSIS — Z0000 Encounter for general adult medical examination without abnormal findings: Secondary | ICD-10-CM

## 2015-07-22 DIAGNOSIS — D509 Iron deficiency anemia, unspecified: Secondary | ICD-10-CM | POA: Diagnosis not present

## 2015-07-22 DIAGNOSIS — Z131 Encounter for screening for diabetes mellitus: Secondary | ICD-10-CM

## 2015-07-22 DIAGNOSIS — H538 Other visual disturbances: Secondary | ICD-10-CM

## 2015-07-22 DIAGNOSIS — K029 Dental caries, unspecified: Secondary | ICD-10-CM | POA: Insufficient documentation

## 2015-07-22 DIAGNOSIS — Z7982 Long term (current) use of aspirin: Secondary | ICD-10-CM | POA: Diagnosis not present

## 2015-07-22 DIAGNOSIS — I10 Essential (primary) hypertension: Secondary | ICD-10-CM | POA: Diagnosis not present

## 2015-07-22 DIAGNOSIS — R42 Dizziness and giddiness: Secondary | ICD-10-CM

## 2015-07-22 DIAGNOSIS — J069 Acute upper respiratory infection, unspecified: Secondary | ICD-10-CM | POA: Diagnosis not present

## 2015-07-22 LAB — CBC WITH DIFFERENTIAL/PLATELET
BASOS PCT: 0 % (ref 0–1)
Basophils Absolute: 0 10*3/uL (ref 0.0–0.1)
EOS ABS: 0.2 10*3/uL (ref 0.0–0.7)
Eosinophils Relative: 4 % (ref 0–5)
HCT: 41.9 % (ref 36.0–46.0)
HEMOGLOBIN: 13.3 g/dL (ref 12.0–15.0)
Lymphocytes Relative: 44 % (ref 12–46)
Lymphs Abs: 1.7 10*3/uL (ref 0.7–4.0)
MCH: 23.4 pg — ABNORMAL LOW (ref 26.0–34.0)
MCHC: 31.7 g/dL (ref 30.0–36.0)
MCV: 73.8 fL — ABNORMAL LOW (ref 78.0–100.0)
MONO ABS: 0.2 10*3/uL (ref 0.1–1.0)
Monocytes Relative: 6 % (ref 3–12)
NEUTROS ABS: 1.8 10*3/uL (ref 1.7–7.7)
Neutrophils Relative %: 46 % (ref 43–77)
PLATELETS: 249 10*3/uL (ref 150–400)
RBC: 5.68 MIL/uL — AB (ref 3.87–5.11)
RDW: 17.3 % — AB (ref 11.5–15.5)
WBC: 3.9 10*3/uL — AB (ref 4.0–10.5)

## 2015-07-22 LAB — POCT GLYCOSYLATED HEMOGLOBIN (HGB A1C)

## 2015-07-22 LAB — TSH: TSH: 0.89 mIU/L

## 2015-07-22 MED ORDER — AMLODIPINE BESYLATE 2.5 MG PO TABS: 2.5000 mg | ORAL_TABLET | Freq: Every day | ORAL | Status: DC

## 2015-07-22 NOTE — Progress Notes (Signed)
Patient is present for ED f/up URI. Patient reports feeling good, denies any pain.  Patient is concern about blurred vision. States its been going on for almost a year now.  Patient requesting dental referral due to her crack tooth in the front.  Patient agreed to A1c screening, decline flu shot.

## 2015-07-22 NOTE — Progress Notes (Signed)
Kayla Preston Lieber, is a 65 y.o. female  ZOX:[id number]:[id number]  WJX:[id number]  DOB - 1950-11-19  CC:  Chief Complaint  Patient presents with  . Hospitalization Follow-up  . URI       HPI: Kayla Preston Spring is a 65 y.o. female here today to establish medical care.  Has not been to our clinic in long time due to family concerns.  Her son passed away in October, and she is finally starting to recover and wants to take care of herself.  Has co of intermittant dizzy spells w/ some blurry vision x 1 year.   She checks her bp periodically at pharmacy and noted to have sbp >140s at times.  Has gained about 50 lbs since her son's passing.  Denies exercising now, but trying to eat better and be more motivated.    Patient has No headache, No chest pain, No abdominal pain - No Nausea, No new weakness tingling or numbness, No Cough - SOB.  No Known Allergies Past Medical History  Diagnosis Date  . Atrial fibrillation (HCC)   . Seasonal allergies   . Atrial fibrillation (HCC)     she had ablation which resolved intermittent afib  . Obesity   afib ablation in 2010 and no issues since.  Current Outpatient Prescriptions on File Prior to Visit  Medication Sig Dispense Refill  . aspirin EC 81 MG tablet Take 81 mg by mouth daily.    Marland Kitchen. ibuprofen (ADVIL,MOTRIN) 800 MG tablet Take 1 tablet (800 mg total) by mouth 3 (three) times daily. 21 tablet 0  . meclizine (ANTIVERT) 50 MG tablet Take 1 tablet (50 mg total) by mouth 3 (three) times daily as needed for dizziness or nausea. 15 tablet 0  . Multiple Vitamin (MULITIVITAMIN WITH MINERALS) TABS Take 1 tablet by mouth every other day.     . benzonatate (TESSALON) 100 MG capsule Take 1 capsule (100 mg total) by mouth every 8 (eight) hours. (Patient not taking: Reported on 07/22/2015) 21 capsule 0  . chlorpheniramine-HYDROcodone (TUSSIONEX PENNKINETIC ER) 10-8 MG/5ML LQCR Take 5 mLs by mouth 2 (two) times daily. (Patient not taking: Reported on 02/07/2015) 115 mL 0  .  Cyanocobalamin (VITAMIN B 12 PO) Take 1 tablet by mouth daily. Reported on 07/22/2015    . DM-Phenylephrine-Acetaminophen (THERAFLU WARMING RELIEF DAY PO) Take 1 packet by mouth every 4 (four) hours as needed (for flu symptoms). Reported on 07/22/2015    . ECHINACEA PO Take 1 tablet by mouth daily. Reported on 07/22/2015    . guaiFENesin (MUCINEX) 600 MG 12 hr tablet Take 1 tablet (600 mg total) by mouth 2 (two) times daily. (Patient not taking: Reported on 07/22/2015) 14 tablet 0  . Multiple Vitamins-Minerals (EMERGEN-C VITAMIN C PO) Take 1 tablet by mouth daily as needed (cold symptoms). Reported on 07/22/2015    . Phenylephrine-APAP-Guaifenesin (TYLENOL SINUS SEVERE PO) Take 1 tablet by mouth every 4 (four) hours as needed (for pain/congestion). Reported on 07/22/2015     No current facility-administered medications on file prior to visit.   History reviewed. No pertinent family history. Social History   Social History  . Marital Status: Single    Spouse Name: N/A  . Number of Children: N/A  . Years of Education: N/A   Occupational History  . Not on file.   Social History Main Topics  . Smoking status: Never Smoker   . Smokeless tobacco: Not on file  . Alcohol Use: No  . Drug Use: No  . Sexual Activity:  No   Other Topics Concern  . Not on file   Social History Narrative  son passed in Oct 2016. Last mammogram few years ago, last cscope years ago as well in another city.  Both negative at time.  Review of Systems: Constitutional: Negative for fever, chills, diaphoresis, activity change, appetite change and fatigue.  +50 lbs since Oct 2016.  HENT: Negative for ear pain, nosebleeds, congestion, facial swelling, rhinorrhea, neck pain, neck stiffness and ear discharge.  Eyes: Negative for pain, discharge, redness, itching and visual disturbance. Respiratory: Negative for cough, choking, chest tightness, shortness of breath, wheezing and stridor.  Cardiovascular: Negative for chest  pain, palpitations and leg swelling. Gastrointestinal: Negative for abdominal distention. Genitourinary: Negative for dysuria, urgency, frequency, hematuria, flank pain, decreased urine volume, difficulty urinating and dyspareunia.  Musculoskeletal: Negative for back pain, joint swelling, arthralgia and gait problem. Neurological: Negative for remors, seizures, syncope, facial asymmetry, speech difficulty, weakness, light-headedness, numbness and headaches. +  Dizziness/blurry vision occasionally x 1 year, denies room spinning currently.  Hematological: Negative for adenopathy. Does not bruise/bleed easily. Psychiatric/Behavioral: Negative for hallucinations, behavioral problems, confusion, dysphoric mood, decreased concentration and agitation.      Objective:   Filed Vitals:   07/22/15 0946 07/22/15 0947  BP: 121/84 129/86  Pulse: 97 96  Temp:    Resp:     Negative orthostatics.  Physical Exam: Constitutional: Patient appears well-developed and well-nourished. No distress.  Pleasant, aaox3 HENT: Normocephalic, atraumatic, External right and left ear normal. Oropharynx is clear and moist.   bilat tm clear.  Poor dentition in left upper molar, some teeth decay in front tooth Eyes: Conjunctivae and EOM are normal. PERRL, no scleral icterus. Neck: Normal ROM. Neck supple. No JVD. No tracheal deviation. No thyromegaly. CVS: RRR, S1/S2 +, no murmurs, no gallops, no carotid bruit.  Pulmonary: Effort and breath sounds normal, no stridor, rhonchi, wheezes, rales.  Abdominal: obese. Soft. BS +, no distension, tenderness, rebound or guarding.  Musculoskeletal: Normal range of motion. No edema and no tenderness.  Lymphadenopathy: No lymphadenopathy noted, cervical, inguinal or axillary Neuro: Alert. Normal reflexes, muscle tone coordination. No cranial nerve deficit. Skin: Skin is warm and dry. No rash noted. Not diaphoretic. No erythema. No pallor. Psychiatric: Normal mood and affect. Behavior,  judgment, thought content normal.  Lab Results  Component Value Date   WBC 4.9 02/07/2015   HGB 13.1 02/07/2015   HCT 41.4 02/07/2015   MCV 73.1* 02/07/2015   PLT 234 02/07/2015   Lab Results  Component Value Date   CREATININE 0.61 02/07/2015   BUN 9 02/07/2015   NA 142 02/07/2015   K 4.0 02/07/2015   CL 111 02/07/2015   CO2 23 02/07/2015    No results found for: HGBA1C Lipid Panel  No results found for: CHOL, TRIG, HDL, CHOLHDL, VLDL, LDLCALC    Vision screening/ B 20/25 R 20/20 L20/25   Assessment and plan:   1. HTN (hypertension), benign, mild - norvasc 2.5 mg po qday. - Lipid Panel - Basic Metabolic Panel - fu [facility] clinical pharm 2 wks for bp chk  2. Anemia, iron deficiency, hx of, requiring iv iron infusions.  In setting of dizziness/blurry vision, will chk check - CBC with Differential - Iron, TIBC and Ferritin Panel - VITAMIN D 25 Hydroxy (Vit-D Deficiency, Fractures)  3. Weight gain, suspect due to grief/loss of son and inactivity chk - TSH - encouraged healthy diet and increase exercise/activity  4. Blurry vision - ?deyhdration, vision screening essentially normal -  recd have glasses rechecked/vision screening at optho.  5. Dizzy, appears transient - neg orthostatics, ?ro anemia - has meclizine prn, but hasnt been taken since not bad  6. Routine health maintenance Set up - Ambulatory referral to Gastroenterology for screening colonoscopy - MM Digital Diagnostic Bilat; Future , at least 3 years ago from last 1 per pt., but she is not definitive on dates. - aic 5.8 (dm screening) - needs papsmear at next visit.  7. Teeth decayed - Ambulatory referral to Dentistry   Return in about 3 months (around 10/22/2015).  The patient was given clear instructions to go to ER or return to medical center if symptoms don't improve, worsen or new problems develop. The patient verbalized understanding. The patient was told to call to get lab results if they  haven't heard anything in the next week.    Pete Glatter, MD, MBA/MHA [facility] [place], [place] [phone]   07/22/2015, 10:07 AM

## 2015-07-22 NOTE — Patient Instructions (Signed)
- pick up prescriptions - will make referral for mammogram and colonoscopy.   Referral speciallist will call you. - follow w/ [facility] pharm 2 wks for bp check  Follow up w/ me in 3months.  It was a pleasure meeting you.   Heart-Healthy Eating Plan Many factors influence your heart health, including eating and exercise habits. Heart (coronary) risk increases with abnormal blood fat (lipid) levels. Heart-healthy meal planning includes limiting unhealthy fats, increasing healthy fats, and making other small dietary changes. This includes maintaining a healthy body weight to help keep lipid levels within a normal range. WHAT IS MY PLAN?  Your health care provider recommends that you:  Get no more than 20% of the total calories in your daily diet from fat.  Limit your intake of saturated fat to less than 5% of your total calories each day.  Limit the amount of salt in your diet to less than 2,000 grams per day. WHAT TYPES OF FAT SHOULD I CHOOSE?  Choose healthy fats more often. Choose monounsaturated and polyunsaturated fats, such as olive oil and canola oil, flaxseeds, walnuts, almonds, and seeds.  Eat more omega-3 fats. Good choices include salmon, mackerel, sardines, tuna, flaxseed oil, and ground flaxseeds. Aim to eat fish at least two times each week.  Limit saturated fats. Saturated fats are primarily found in animal products, such as meats, butter, and cream. Plant sources of saturated fats include palm oil, palm kernel oil, and coconut oil.  Avoid foods with partially hydrogenated oils in them. These contain trans fats. Examples of foods that contain trans fats are stick margarine, some tub margarines, cookies, crackers, and other baked goods. WHAT GENERAL GUIDELINES DO I NEED TO FOLLOW?  Check food labels carefully to identify foods with trans fats or high amounts of saturated fat.  Fill one half of your plate with vegetables and green salads. Eat 4-5 servings of vegetables  per day. A serving of vegetables equals 1 cup of raw leafy vegetables,  cup of raw or cooked cut-up vegetables, or  cup of vegetable juice.  Fill one fourth of your plate with whole grains. Look for the word "whole" as the first word in the ingredient list.  Fill one fourth of your plate with lean protein foods.  Eat 4-5 servings of fruit per day. A serving of fruit equals one medium whole fruit,  cup of dried fruit,  cup of fresh, frozen, or canned fruit, or  cup of 100% fruit juice.  Eat more foods that contain soluble fiber. Examples of foods that contain this type of fiber are apples, broccoli, carrots, beans, peas, and barley. Aim to get 20-30 g of fiber per day.  Eat more home-cooked food and less restaurant, buffet, and fast food.  Limit or avoid alcohol.  Limit foods that are high in starch and sugar.  Avoid fried foods.  Cook foods by using methods other than frying. Baking, boiling, grilling, and broiling are all great options. Other fat-reducing suggestions include:  Removing the skin from poultry.  Removing all visible fats from meats.  Skimming the fat off of stews, soups, and gravies before serving them.  Steaming vegetables in water or broth.  Lose weight if you are overweight. Losing just 5-10% of your initial body weight can help your overall health and prevent diseases such as diabetes and heart disease.  Increase your consumption of nuts, legumes, and seeds to 4-5 servings per week. One serving of dried beans or legumes equals  cup after being cooked,  one serving of nuts equals 1 ounces, and one serving of seeds equals  ounce or 1 tablespoon.  You may need to monitor your salt (sodium) intake, especially if you have high blood pressure. Talk with your health care provider or dietitian to get more information about reducing sodium. WHAT FOODS CAN I EAT? Grains Breads, including [place], white, pita, wheat, raisin, rye, oatmeal, and [place]. Tortillas that  are neither fried nor made with lard or trans fat. Low-fat rolls, including hotdog and hamburger buns and English muffins. Biscuits. Muffins. Waffles. Pancakes. Light popcorn. Whole-grain cereals. Flatbread. Melba toast. Pretzels. Breadsticks. Rusks. Low-fat snacks and crackers, including oyster, saltine, matzo, graham, animal, and rye. Rice and pasta, including brown rice and those that are made with whole wheat. Vegetables All vegetables. Fruits All fruits, but limit coconut. Meats and Other Protein Sources Lean, well-trimmed beef, veal, pork, and lamb. Chicken and [place] without skin. All fish and shellfish. Wild duck, rabbit, pheasant, and venison. Egg whites or low-cholesterol egg substitutes. Dried beans, peas, lentils, and tofu.Seeds and most nuts. Dairy Low-fat or nonfat cheeses, including ricotta, string, and mozzarella. Skim or 1% milk that is liquid, powdered, or evaporated. Buttermilk that is made with low-fat milk. Nonfat or low-fat yogurt. Beverages Mineral water. Diet carbonated beverages. Sweets and Desserts Sherbets and fruit ices. Honey, jam, marmalade, jelly, and syrups. Meringues and gelatins. Pure sugar candy, such as hard candy, jelly beans, gumdrops, mints, marshmallows, and small amounts of dark chocolate. [employer]. Eat all sweets and desserts in moderation. Fats and Oils Nonhydrogenated (trans-free) margarines. Vegetable oils, including soybean, sesame, sunflower, olive, peanut, safflower, corn, canola, and cottonseed. Salad dressings or mayonnaise that are made with a vegetable oil. Limit added fats and oils that you use for cooking, baking, salads, and as spreads. Other Cocoa powder. Coffee and tea. All seasonings and condiments. The items listed above may not be a complete list of recommended foods or beverages. Contact your dietitian for more options. WHAT FOODS ARE NOT RECOMMENDED? Grains Breads that are made with saturated or trans fats, oils, or whole  milk. Croissants. Butter rolls. Cheese breads. Sweet rolls. Donuts. Buttered popcorn. Chow mein noodles. High-fat crackers, such as cheese or butter crackers. Meats and Other Protein Sources Fatty meats, such as hotdogs, short ribs, sausage, spareribs, bacon, ribeye roast or steak, and mutton. High-fat deli meats, such as salami and bologna. Caviar. Domestic duck and goose. Organ meats, such as kidney, liver, sweetbreads, brains, gizzard, chitterlings, and heart. Dairy Cream, sour cream, cream cheese, and creamed cottage cheese. Whole milk cheeses, including blue (bleu), [address], [place], [place], [address], [place], [address], [place], [place], and [place]. Whole or 2% milk that is liquid, evaporated, or condensed. Whole buttermilk. Cream sauce or high-fat cheese sauce. Yogurt that is made from whole milk. Beverages Regular sodas and drinks with added sugar. Sweets and Desserts Frosting. Pudding. Cookies. Cakes other than angel food cake. Candy that has milk chocolate or white chocolate, hydrogenated fat, butter, coconut, or unknown ingredients. Buttered syrups. Full-fat ice cream or ice cream drinks. Fats and Oils Gravy that has suet, meat fat, or shortening. Cocoa butter, hydrogenated oils, palm oil, coconut oil, palm kernel oil. These can often be found in baked products, candy, fried foods, nondairy creamers, and whipped toppings. Solid fats and shortenings, including bacon fat, salt pork, lard, and butter. Nondairy cream substitutes, such as coffee creamers and sour cream substitutes. Salad dressings that are made of unknown oils, cheese, or sour cream. The items listed above may not  be a complete list of foods and beverages to avoid. Contact your dietitian for more information.   This information is not intended to replace advice given to you by your health care provider. Make sure you discuss any questions you have with your health care provider.   Document Released: 02/03/2008 Document  Revised: 05/17/2014 Document Reviewed: 10/18/2013 Elsevier Interactive Patient Education 2016 [employer]   Hypertension Hypertension, commonly called high blood pressure, is when the force of blood pumping through your arteries is too strong. Your arteries are the blood vessels that carry blood from your heart throughout your body. A blood pressure reading consists of a higher number over a lower number, such as 110/72. The higher number (systolic) is the pressure inside your arteries when your heart pumps. The lower number (diastolic) is the pressure inside your arteries when your heart relaxes. Ideally you want your blood pressure below 120/80. Hypertension forces your heart to work harder to pump blood. Your arteries may become narrow or stiff. Having untreated or uncontrolled hypertension can cause heart attack, stroke, kidney disease, and other problems. RISK FACTORS Some risk factors for high blood pressure are controllable. Others are not.  Risk factors you cannot control include:   Race. You may be at higher risk if you are African American.  Age. Risk increases with age.  Gender. Men are at higher risk than women before age 63 years. After age 63, women are at higher risk than men. Risk factors you can control include:  Not getting enough exercise or physical activity.  Being overweight.  Getting too much fat, sugar, calories, or salt in your diet.  Drinking too much alcohol. SIGNS AND SYMPTOMS Hypertension does not usually cause signs or symptoms. Extremely high blood pressure (hypertensive crisis) may cause headache, anxiety, shortness of breath, and nosebleed. DIAGNOSIS To check if you have hypertension, your health care provider will measure your blood pressure while you are seated, with your arm held at the level of your heart. It should be measured at least twice using the same arm. Certain conditions can cause a difference in blood pressure between your right and left  arms. A blood pressure reading that is higher than normal on one occasion does not mean that you need treatment. If it is not clear whether you have high blood pressure, you may be asked to return on a different day to have your blood pressure checked again. Or, you may be asked to monitor your blood pressure at home for 1 or more weeks. TREATMENT Treating high blood pressure includes making lifestyle changes and possibly taking medicine. Living a healthy lifestyle can help lower high blood pressure. You may need to change some of your habits. Lifestyle changes may include:  Following the DASH diet. This diet is high in fruits, vegetables, and whole grains. It is low in salt, red meat, and added sugars.  Keep your sodium intake below 2,300 mg per day.  Getting at least 30-45 minutes of aerobic exercise at least 4 times per week.  Losing weight if necessary.  Not smoking.  Limiting alcoholic beverages.  Learning ways to reduce stress. Your health care provider may prescribe medicine if lifestyle changes are not enough to get your blood pressure under control, and if one of the following is true:  You are 6918-159 years of age and your systolic blood pressure is above 140.  You are 10560 years of age or older, and your systolic blood pressure is above 150.  Your diastolic blood  pressure is above 90.  You have diabetes, and your systolic blood pressure is over 140 or your diastolic blood pressure is over 90.  You have kidney disease and your blood pressure is above 140/90.  You have heart disease and your blood pressure is above 140/90. Your personal target blood pressure may vary depending on your medical conditions, your age, and other factors. HOME CARE INSTRUCTIONS  Have your blood pressure rechecked as directed by your health care provider.   Take medicines only as directed by your health care provider. Follow the directions carefully. Blood pressure medicines must be taken as  prescribed. The medicine does not work as well when you skip doses. Skipping doses also puts you at risk for problems.  Do not smoke.   Monitor your blood pressure at home as directed by your health care provider. SEEK MEDICAL CARE IF:   You think you are having a reaction to medicines taken.  You have recurrent headaches or feel dizzy.  You have swelling in your ankles.  You have trouble with your vision. SEEK IMMEDIATE MEDICAL CARE IF:  You develop a severe headache or confusion.  You have unusual weakness, numbness, or feel faint.  You have severe chest or abdominal pain.  You vomit repeatedly.  You have trouble breathing. MAKE SURE YOU:   Understand these instructions.  Will watch your condition.  Will get help right away if you are not doing well or get worse.   This information is not intended to replace advice given to you by your health care provider. Make sure you discuss any questions you have with your health care provider.   Document Released: 04/26/2005 Document Revised: 09/10/2014 Document Reviewed: 02/16/2013 Elsevier Interactive Patient Education [employer]

## 2015-07-23 ENCOUNTER — Other Ambulatory Visit: Payer: Self-pay | Admitting: Internal Medicine

## 2015-07-23 DIAGNOSIS — Z1231 Encounter for screening mammogram for malignant neoplasm of breast: Secondary | ICD-10-CM

## 2015-07-23 LAB — VITAMIN D 25 HYDROXY (VIT D DEFICIENCY, FRACTURES): Vit D, 25-Hydroxy: 26 ng/mL — ABNORMAL LOW (ref 30–100)

## 2015-07-24 ENCOUNTER — Other Ambulatory Visit: Payer: Self-pay | Admitting: Internal Medicine

## 2015-07-24 DIAGNOSIS — E559 Vitamin D deficiency, unspecified: Secondary | ICD-10-CM

## 2015-07-24 LAB — BASIC METABOLIC PANEL
BUN: 12 mg/dL (ref 7–25)
CALCIUM: 9.5 mg/dL (ref 8.6–10.4)
CO2: 23 mmol/L (ref 20–31)
CREATININE: 0.69 mg/dL (ref 0.50–0.99)
Chloride: 108 mmol/L (ref 98–110)
GLUCOSE: 102 mg/dL — AB (ref 65–99)
Potassium: 3.9 mmol/L (ref 3.5–5.3)
SODIUM: 139 mmol/L (ref 135–146)

## 2015-07-24 LAB — IRON,TIBC AND FERRITIN PANEL
%SAT: 35 % (ref 11–50)
FERRITIN: 404 ng/mL — AB (ref 20–288)
IRON: 99 ug/dL (ref 45–160)
TIBC: 280 ug/dL (ref 250–450)

## 2015-07-24 LAB — LIPID PANEL
CHOL/HDL RATIO: 3.4 ratio (ref <=5.0)
Cholesterol: 200 mg/dL (ref 125–200)
HDL: 58 mg/dL (ref >=46)
LDL CALC: 129 mg/dL (ref <130)
Triglycerides: 66 mg/dL (ref <150)
VLDL: 13 mg/dL (ref <30)

## 2015-07-24 MED ORDER — VITAMIN D (ERGOCALCIFEROL) 1.25 MG (50000 UNIT) PO CAPS: 50000.0000 [IU] | ORAL_CAPSULE | ORAL | Status: DC

## 2015-07-25 ENCOUNTER — Telehealth: Payer: Self-pay

## 2015-07-25 NOTE — Telephone Encounter (Signed)
Patient is returning nurse call./...Marland Kitchen.Marland Kitchen.please follow up with patient

## 2015-07-25 NOTE — Telephone Encounter (Signed)
-----   Message from Pete Glatterawn T Langeland, MD sent at 07/24/2015 11:23 AM EDT ----- Please call patient and tell her that most of her labs were normal.  Her cholesterol was borderline, I recommend increasing her exeriise (walk!) and watching her diet, low cholesterol recommended. She is vitamin D deficient so I put in prescription for Vit D.  She is not anemic, and her kidneys look good.  Thyroid normal so does not explain her weight gain, likely from little activity and her diet, so encourage her to keep working on that!  Thanks

## 2015-07-25 NOTE — Telephone Encounter (Signed)
Patient returning nurse phone call.

## 2015-07-25 NOTE — Telephone Encounter (Signed)
CMA called patient, patient did not answer. A message for the patient to return my call was left. 

## 2015-07-25 NOTE — Telephone Encounter (Signed)
CMA called patient, patient verified name and DOB. Patient was given lab results verbalized she understood with no further questions. 

## 2015-08-05 ENCOUNTER — Ambulatory Visit: Payer: BLUE CROSS/BLUE SHIELD | Attending: Internal Medicine | Admitting: Pharmacist

## 2015-08-05 ENCOUNTER — Encounter: Payer: Self-pay | Admitting: Pharmacist

## 2015-08-05 VITALS — BP 131/86 | HR 86

## 2015-08-05 DIAGNOSIS — I10 Essential (primary) hypertension: Secondary | ICD-10-CM | POA: Insufficient documentation

## 2015-08-05 DIAGNOSIS — Z79899 Other long term (current) drug therapy: Secondary | ICD-10-CM | POA: Insufficient documentation

## 2015-08-05 NOTE — Patient Instructions (Addendum)
Thanks for coming to see me!  Your blood pressure is great - keep up the great work!  Take all of your medications as prescribed.  Follow up with Dr. Julien NordmannLangeland for the muscle pain

## 2015-08-05 NOTE — Progress Notes (Signed)
S:    Patient arrives in good spirits.    Presents to the clinic for hypertension evaluation. Patient was referred on 07/22/15 by Dr. Julien NordmannLangeland.  Patient was last seen by Primary Care Provider on 07/22/15.   Patient reports adherence with medications.  Current BP Medications include:  Amlodipine 2.5 mg daily  Antihypertensives tried in the past include: atenolol (see Care Everywhere)  Dietary habits include: trying very hard to eat right and focus on eating veggies, fruits, meats, and "good carbs"  She is also walking a lot at her job. She has noticed muscle pain in her right calf. It has mad it difficult for her to walk at work and outside (she walks for exercise). She would like this to get better because she is trying to lose weight. She is going to look at getting new shoes because she thinks she pulled a muscle in her calf due to poor fitting shoes.   O:   Last 3 Office BP readings: BP Readings from Last 3 Encounters:  07/22/15 129/86  05/11/15 103/77  02/07/15 122/83    BMET    Component Value Date/Time   NA 139 07/22/2015 1019   K 3.9 07/22/2015 1019   CL 108 07/22/2015 1019   CO2 23 07/22/2015 1019   GLUCOSE 102* 07/22/2015 1019   BUN 12 07/22/2015 1019   CREATININE 0.69 07/22/2015 1019   CREATININE 0.61 02/07/2015 1105   CALCIUM 9.5 07/22/2015 1019   GFRNONAA >60 02/07/2015 1105   GFRAA >60 02/07/2015 1105    A/P: Hypertension longstanding currently controlled on current medications.  Continued amlodipine 2.5 mg daily. Last potassium was normal. Patient vitamin D deficient and is currently on vitamin D 50,000 units weekly - deficiency can increase risk of muscle pain and cramping. Patient has ibuprofen at home as needed. She is going to look into getting better fitting shoes for work. Unlikely due to amlodipine as she has no bilateral edema. Needs to follow up with Dr. Julien NordmannLangeland for further evaluation.  Results reviewed and written information provided.   Total time  in face-to-face counseling 20 minutes.   F/U Clinic Visit with Dr. Julien NordmannLangeland as directed.

## 2015-08-06 ENCOUNTER — Ambulatory Visit
Admission: RE | Admit: 2015-08-06 | Discharge: 2015-08-06 | Disposition: A | Discharge status: Home / Self Care | Payer: BLUE CROSS/BLUE SHIELD | Source: Ambulatory Visit | Attending: Internal Medicine | Admitting: Internal Medicine

## 2015-08-06 DIAGNOSIS — Z1231 Encounter for screening mammogram for malignant neoplasm of breast: Secondary | ICD-10-CM

## 2015-08-08 ENCOUNTER — Telehealth: Payer: Self-pay

## 2015-08-08 NOTE — Telephone Encounter (Signed)
-----   Message from Pete Glatterawn T Langeland, MD sent at 08/07/2015  9:10 AM EDT ----- Please call patient that her screening mammogram was negative.  No evidence of malignancy. We will need to repeat next year.  Please also update her Health maintenance records to reflect she is uptodate w/ her mammogram if it isnt' done automatically. Thanks!

## 2015-08-11 NOTE — Telephone Encounter (Signed)
CMA called patient, patient didn't answer. A message was left for the patient to return my call.

## 2015-08-12 NOTE — Telephone Encounter (Signed)
CMA called patient, patient verified name and DOB. Patient was given her results for the mammogram, patient verbalized she understood with no further questions.

## 2015-09-12 ENCOUNTER — Ambulatory Visit: Payer: BLUE CROSS/BLUE SHIELD

## 2015-09-19 ENCOUNTER — Ambulatory Visit: Payer: BLUE CROSS/BLUE SHIELD

## 2015-11-04 ENCOUNTER — Ambulatory Visit: Payer: BLUE CROSS/BLUE SHIELD | Admitting: Family Medicine

## 2015-11-12 ENCOUNTER — Emergency Department (HOSPITAL_COMMUNITY)
Admission: EM | Admit: 2015-11-12 | Discharge: 2015-11-12 | Disposition: A | Discharge status: Home / Self Care | Payer: BLUE CROSS/BLUE SHIELD | Attending: Emergency Medicine | Admitting: Emergency Medicine

## 2015-11-12 ENCOUNTER — Emergency Department (HOSPITAL_COMMUNITY): Payer: BLUE CROSS/BLUE SHIELD

## 2015-11-12 ENCOUNTER — Encounter (HOSPITAL_COMMUNITY): Payer: Self-pay | Admitting: Emergency Medicine

## 2015-11-12 DIAGNOSIS — E860 Dehydration: Secondary | ICD-10-CM | POA: Insufficient documentation

## 2015-11-12 DIAGNOSIS — R42 Dizziness and giddiness: Secondary | ICD-10-CM | POA: Diagnosis present

## 2015-11-12 DIAGNOSIS — Z7982 Long term (current) use of aspirin: Secondary | ICD-10-CM | POA: Insufficient documentation

## 2015-11-12 LAB — BASIC METABOLIC PANEL
Anion gap: 5 (ref 5–15)
BUN: 10 mg/dL (ref 6–20)
CALCIUM: 9.4 mg/dL (ref 8.9–10.3)
CO2: 24 mmol/L (ref 22–32)
CREATININE: 0.75 mg/dL (ref 0.44–1.00)
Chloride: 110 mmol/L (ref 101–111)
GFR calc non Af Amer: >60 mL/min (ref >60)
Glucose, Bld: 95 mg/dL (ref 65–99)
Potassium: 4 mmol/L (ref 3.5–5.1)
SODIUM: 139 mmol/L (ref 135–145)

## 2015-11-12 LAB — CBC
HCT: 40.5 % (ref 36.0–46.0)
Hemoglobin: 13 g/dL (ref 12.0–15.0)
MCH: 23.9 pg — ABNORMAL LOW (ref 26.0–34.0)
MCHC: 32.1 g/dL (ref 30.0–36.0)
MCV: 74.4 fL — ABNORMAL LOW (ref 78.0–100.0)
Platelets: 227 10*3/uL (ref 150–400)
RBC: 5.44 MIL/uL — ABNORMAL HIGH (ref 3.87–5.11)
RDW: 16.1 % — AB (ref 11.5–15.5)
WBC: 4.1 10*3/uL (ref 4.0–10.5)

## 2015-11-12 NOTE — ED Notes (Signed)
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 PT reports she has been "off balance" for 2 weeks. PT thought this was due to sun exposure so she has reduced her exposure and is drinking fluids. Lightheadedness has not improved. PT states, "I feel like I can just faint." PT reports she has no energy and is very weak. PT reports she was exposed to a poisonous plant on a tree 3 weeks ago and got a rash. Rash has improved, but still itches. PT is concerned that lightheadedness is related to the rash.

## 2015-11-12 NOTE — ED Notes (Signed)
Pt verbalized understanding of d/c instructions and follow-up care. No further questions/concerns, VSS, ambulatory w/ steady gait (refused wheelchair) 

## 2015-11-12 NOTE — Discharge Instructions (Signed)
Please read and follow all provided instructions.  Your diagnoses today include:  1. Dehydration    Tests performed today include:  Vital signs. See below for your results today.   Medications prescribed:   Take as prescribed   Home care instructions:  Follow any educational materials contained in this packet. Please take more frequent breaks at home when outside gardening. Drink adequate fluids. Try Gatorade or Powerade for added electrolytes.   Follow-up instructions: Please follow-up with your primary care provider for further evaluation of symptoms and treatment   Return instructions:   Please return to the Emergency Department if you do not get better, if you get worse, or new symptoms OR  - Fever (temperature greater than 101.28F)  - Bleeding that does not stop with holding pressure to the area    -Severe pain (please note that you may be more sore the day after your accident)  - Chest Pain  - Difficulty breathing  - Severe nausea or vomiting  - Inability to tolerate food and liquids  - Passing out  - Skin becoming red around your wounds  - Change in mental status (confusion or lethargy)  - New numbness or weakness     Please return if you have any other emergent concerns.  Additional Information:  Your vital signs today were: BP 144/85 mmHg   Pulse 74   Temp(Src) 97.7 F (36.5 C) (Oral)   Resp 20   SpO2 100% If your blood pressure (BP) was elevated above 135/85 this visit, please have this repeated by your doctor within one month. ---------------

## 2015-11-12 NOTE — ED Provider Notes (Signed)
CSN: [id number]     Arrival date & time 11/12/15  1149 History   First MD Initiated Contact with Patient 11/12/15 1216     Chief Complaint  Patient presents with  . Dizziness   (Consider location/radiation/quality/duration/timing/severity/associated sxs/prior Treatment) HPI  65 y.o. female, presents to the Emergency Department today complaining of feeling off balance x 2 weeks. Pt states that 2 weeks ago she was gardening in the morning when she worked on some bushes in the [place] towards the woods. Noted pulling weeds. Later that day she developed a rash on her bilateral hands suspicious for poison ivy. Did OTC home remedies with relief of symptoms. Although rash resolved, pt began feeling dizzy and off balance after she worked in the garden States symptoms usually only occur after gardening from 0630 to 1400. But resolve after rest. States that she thinks she drinks enough water during that time. No N/V/D. No CP/SOB/ABD pain. No headaches. No vision changes. No numbness/tingling. Pt does not have history of neuro deficits or CVA. Only cardiac hx is when she had Afib that was controlled several years ago after ablation. Pt with no pain currently. Pt worried that rash played a role in making her feel off balance. No other symptoms noted.     Past Medical History  Diagnosis Date  . Atrial fibrillation (HCC)   . Seasonal allergies   . Atrial fibrillation (HCC)     she had ablation which resolved intermittent afib  . Obesity    Past Surgical History  Procedure Laterality Date  . Ablation     No family history on file. Social History  Substance Use Topics  . Smoking status: Never Smoker   . Smokeless tobacco: None  . Alcohol Use: No   OB History    No data available     Review of Systems ROS reviewed and all are negative for acute change except as noted in the HPI.  Allergies  Review of patient's allergies indicates no known allergies.  Home Medications   Prior to Admission  medications   Medication Sig Start Date End Date Taking? Authorizing Provider  amLODipine (NORVASC) 2.5 MG tablet Take 1 tablet (2.5 mg total) by mouth daily. 07/22/15  Yes Pete Glatterawn T Langeland, MD  aspirin EC 81 MG tablet Take 81 mg by mouth daily.   Yes Historical Provider, MD  Cyanocobalamin (VITAMIN B 12 PO) Take 1 tablet by mouth daily. Reported on 07/22/2015   Yes Historical Provider, MD  ECHINACEA PO Take 1 tablet by mouth daily. Reported on 07/22/2015   Yes Historical Provider, MD  Multiple Vitamin (MULITIVITAMIN WITH MINERALS) TABS Take 1 tablet by mouth every other day.    Yes Historical Provider, MD  Phenylephrine-APAP-Guaifenesin (TYLENOL SINUS SEVERE PO) Take 1 tablet by mouth every 4 (four) hours as needed (for pain/congestion). Reported on 07/22/2015   Yes Historical Provider, MD  DM-Phenylephrine-Acetaminophen (THERAFLU WARMING RELIEF DAY PO) Take 1 packet by mouth every 4 (four) hours as needed (for flu symptoms). Reported on 07/22/2015    Historical Provider, MD  ibuprofen (ADVIL,MOTRIN) 800 MG tablet Take 1 tablet (800 mg total) by mouth 3 (three) times daily. Patient not taking: Reported on 11/12/2015 05/11/15   Ace GinsSerena Y Sam, PA-C  meclizine (ANTIVERT) 50 MG tablet Take 1 tablet (50 mg total) by mouth 3 (three) times daily as needed for dizziness or nausea. 02/07/15   Cheri FowlerKayla Rose, PA-C  Multiple Vitamins-Minerals (EMERGEN-C VITAMIN C PO) Take 1 tablet by mouth daily as needed (cold symptoms).  Reported on 07/22/2015    Historical Provider, MD  Vitamin D, Ergocalciferol, (DRISDOL) 50000 units CAPS capsule Take 1 capsule (50,000 Units total) by mouth every 7 (seven) days. 07/24/15   Pete Glatterawn T Langeland, MD   BP 144/85 mmHg  Pulse 74  Temp(Src) 97.7 F (36.5 C) (Oral)  Resp 20  SpO2 100%   Physical Exam  Constitutional: She is oriented to person, place, and time. She appears well-developed and well-nourished.  HENT:  Head: Normocephalic and atraumatic.  Eyes: EOM are normal. Pupils are equal,  round, and reactive to light.  Neck: Normal range of motion. Neck supple. No tracheal deviation present.  Cardiovascular: Normal rate, regular rhythm, normal heart sounds and intact distal pulses.   No murmur heard. Pulmonary/Chest: Effort normal and breath sounds normal. No respiratory distress. She has no wheezes. She has no rales. She exhibits no tenderness.  Abdominal: Soft. Normal appearance and bowel sounds are normal. There is no tenderness. There is no rigidity, no rebound, no guarding, no tenderness at McBurney's point and negative Murphy's sign.  Musculoskeletal: Normal range of motion.  Neurological: She is alert and oriented to person, place, and time. She has normal strength and normal reflexes. No cranial nerve deficit or sensory deficit. She displays a negative Romberg sign.  Able to ambulate without difficulty. Neg pronator drift. Cranial Nerves:  II: Pupils equal, round, reactive to light III,IV, VI: ptosis not present, extra-ocular motions intact bilaterally  V,VII: smile symmetric, facial light touch sensation equal VIII: hearing grossly normal bilaterally  IX,X: midline uvula rise  XI: bilateral shoulder shrug equal and strong XII: midline tongue extension  Skin: Skin is warm and dry.  Psychiatric: She has a normal mood and affect. Her behavior is normal. Thought content normal.  Nursing note and vitals reviewed.  ED Course  Procedures (including critical care time) Labs Review Labs Reviewed  CBC - Abnormal; Notable for the following:    RBC 5.44 (*)    MCV 74.4 (*)    MCH 23.9 (*)    RDW 16.1 (*)    All other components within normal limits  BASIC METABOLIC PANEL   Imaging Review Ct Head Wo Contrast  11/12/2015  CLINICAL DATA:  Headache and dizziness EXAM: CT HEAD WITHOUT CONTRAST TECHNIQUE: Contiguous axial images were obtained from the base of the skull through the vertex without intravenous contrast. COMPARISON:  None. FINDINGS: Brain: The ventricles are  normal in size and configuration. There is no intracranial mass, hemorrhage, extra-axial fluid collection, or midline shift. Gray-white compartments are normal. No acute infarct evident. Basal ganglia calcification bilaterally is physiologic in etiology. Vascular: There is a small amount of calcification in the cavernous carotid artery on the left. Skull: The bony calvarium appears intact. Sinuses/Orbits: Visualized paranasal sinuses are clear. Visualized orbits appear symmetric bilaterally. Other: Mastoid air cells are clear. IMPRESSION: Small amount of calcification in the left cavernous carotid artery. Basal ganglia calcification bilaterally is physiologic. There is no intracranial mass, hemorrhage, or focal gray - white compartment lesion. Electronically Signed   By: Bretta BangWilliam  Woodruff III M.D.   On: 11/12/2015 13:48   I have personally reviewed and evaluated these images and lab results as part of my medical decision-making.   EKG Interpretation None      MDM  I have reviewed and evaluated the relevant laboratory valuesI have reviewed and evaluated the relevant imaging studies.I have reviewed the relevant previous healthcare records.I obtained HPI from historian. Patient discussed with supervising physician  ED Course:  Assessment:  Pt is a 64yF who presents with with dizziness x 2 weeks. Based on HPI above, it almost sounds due to dehydration from working in garden for long periods of time. Likely not enough PO intake. Symptoms resolve with rest. No hx CVA/ACS. Pt does not have CP/SOB during dizzy spells. No rash apparent on exam. Low indication for intracranial pathology as symptoms have been going on for two weeks with no focal neuro deficits on exam. On exam, pt in NAD. Nontoxic/nonseptic appearing. VSS. Afebrile. Lungs CTA. Heart RRR. Abdomen nontender soft. CN evaluated and unremarkable. Labs unremarkable. CT Head unremarkable. Plan is to DC Home with follow up to PCP. Counseled on adequate  hydration and more frequent breaks in garden. At time of discharge, Patient is in no acute distress. Vital Signs are stable. Patient is able to ambulate. Patient able to tolerate PO.    Disposition/Plan:  DC Home Additional Verbal discharge instructions given and discussed with patient.  Pt Instructed to f/u with PCP in the next week for evaluation and treatment of symptoms. Return precautions given Pt acknowledges and agrees with plan  Supervising Physician Melene Plan, DO   Final diagnoses:  Dehydration    Audry Pili, PA-C 11/12/15 1415  Melene Plan, DO 11/12/15 1417

## 2015-11-13 ENCOUNTER — Ambulatory Visit: Payer: BLUE CROSS/BLUE SHIELD

## 2016-04-16 ENCOUNTER — Emergency Department (HOSPITAL_COMMUNITY)
Admission: EM | Admit: 2016-04-16 | Discharge: 2016-04-16 | Disposition: A | Discharge status: Home / Self Care | Payer: BLUE CROSS/BLUE SHIELD | Attending: Emergency Medicine | Admitting: Emergency Medicine

## 2016-04-16 ENCOUNTER — Encounter (HOSPITAL_COMMUNITY): Payer: Self-pay | Admitting: Emergency Medicine

## 2016-04-16 DIAGNOSIS — Z79899 Other long term (current) drug therapy: Secondary | ICD-10-CM | POA: Diagnosis not present

## 2016-04-16 DIAGNOSIS — H5712 Ocular pain, left eye: Secondary | ICD-10-CM | POA: Diagnosis present

## 2016-04-16 DIAGNOSIS — Z7982 Long term (current) use of aspirin: Secondary | ICD-10-CM | POA: Insufficient documentation

## 2016-04-16 DIAGNOSIS — H109 Unspecified conjunctivitis: Secondary | ICD-10-CM

## 2016-04-16 MED ORDER — FLUORESCEIN SODIUM 0.6 MG OP STRP
1.0000 | ORAL_STRIP | Freq: Once | OPHTHALMIC | Status: AC
  Administered 2016-04-16: 1 via OPHTHALMIC
  Filled 2016-04-16: qty 1

## 2016-04-16 MED ORDER — TETRACAINE HCL 0.5 % OP SOLN
2.0000 [drp] | Freq: Once | OPHTHALMIC | Status: AC
  Administered 2016-04-16: 2 [drp] via OPHTHALMIC
  Filled 2016-04-16: qty 2

## 2016-04-16 MED ORDER — POLYMYXIN B-TRIMETHOPRIM 10000-0.1 UNIT/ML-% OP SOLN: 1.0000 [drp] | OPHTHALMIC | 0 refills | Status: DC

## 2016-04-16 NOTE — ED Provider Notes (Signed)
[facility]-EMERGENCY DEPT Provider Note   CSN: [id number] Arrival date & time: 04/16/16  0640     History   Chief Complaint CC: Eye drainage  HPI   Blood pressure 139/83, pulse 94, temperature 98 F (36.7 C), temperature source Oral, resp. rate 16, height 5\' 6"  (1.676 m), weight 86.2 kg, SpO2 99 %.  Kayla Preston is a 65 y.o. female complaining of left eye redness and irritation onset 3 days ago with associated crusting when waking and clear discharge. States that the symptoms improved after applying saline rinse to the eye but then moved to the contralateral eye. Denies fever, chills, contact lens use, blurred vision, eye pain, lid swelling.  Past Medical History:  Diagnosis Date  . Atrial fibrillation (HCC)   . Atrial fibrillation (HCC)    she had ablation which resolved intermittent afib  . Obesity   . Seasonal allergies     Patient Active Problem List   Diagnosis Date Noted  . Anemia, iron deficiency 07/22/2015  . Awareness of heartbeats 07/05/2011  . Bundle branch block, right 07/05/2011  . Supraventricular tachycardia (HCC) 07/05/2011    Past Surgical History:  Procedure Laterality Date  . ABLATION      OB History    No data available       Home Medications    Prior to Admission medications   Medication Sig Start Date End Date Taking? Authorizing Provider  amLODipine (NORVASC) 2.5 MG tablet Take 1 tablet (2.5 mg total) by mouth daily. 07/22/15   Pete Glatterawn T Langeland, MD  aspirin EC 81 MG tablet Take 81 mg by mouth daily.    Historical Provider, MD  Cyanocobalamin (VITAMIN B 12 PO) Take 1 tablet by mouth daily. Reported on 07/22/2015    Historical Provider, MD  DM-Phenylephrine-Acetaminophen (THERAFLU WARMING RELIEF DAY PO) Take 1 packet by mouth every 4 (four) hours as needed (for flu symptoms). Reported on 07/22/2015    Historical Provider, MD  ECHINACEA PO Take 1 tablet by mouth daily. Reported on 07/22/2015    Historical Provider, MD  ibuprofen (ADVIL,MOTRIN) 800  MG tablet Take 1 tablet (800 mg total) by mouth 3 (three) times daily. Patient not taking: Reported on 11/12/2015 05/11/15   Ace GinsSerena Y Sam, PA-C  meclizine (ANTIVERT) 50 MG tablet Take 1 tablet (50 mg total) by mouth 3 (three) times daily as needed for dizziness or nausea. 02/07/15   Cheri FowlerKayla Rose, PA-C  Multiple Vitamin (MULITIVITAMIN WITH MINERALS) TABS Take 1 tablet by mouth every other day.     Historical Provider, MD  Multiple Vitamins-Minerals (EMERGEN-C VITAMIN C PO) Take 1 tablet by mouth daily as needed (cold symptoms). Reported on 07/22/2015    Historical Provider, MD  Phenylephrine-APAP-Guaifenesin (TYLENOL SINUS SEVERE PO) Take 1 tablet by mouth every 4 (four) hours as needed (for pain/congestion). Reported on 07/22/2015    Historical Provider, MD  trimethoprim-polymyxin b (POLYTRIM) ophthalmic solution Place 1 drop into the right eye every 4 (four) hours. 04/16/16   Lamanda Rudder, PA-C  Vitamin D, Ergocalciferol, (DRISDOL) 50000 units CAPS capsule Take 1 capsule (50,000 Units total) by mouth every 7 (seven) days. 07/24/15   Pete Glatterawn T Langeland, MD    Family History History reviewed. No pertinent family history.  Social History Social History  Substance Use Topics  . Smoking status: Never Smoker  . Smokeless tobacco: Never Used  . Alcohol use No     Allergies   Patient has no known allergies.   Review of Systems Review of Systems  10 systems  reviewed and found to be negative, except as noted in the HPI.   Physical Exam Updated Vital Signs BP 139/83 (BP Location: Right Arm)   Pulse 94   Temp 98 F (36.7 C) (Oral)   Resp 16   Ht 5\' 6"  (1.676 m)   Wt 86.2 kg   SpO2 99%   BMI 30.67 kg/m   Physical Exam  Constitutional: She is oriented to person, place, and time. She appears well-developed and well-nourished. No distress.  HENT:  Head: Normocephalic and atraumatic.  Mouth/Throat: Oropharynx is clear and moist.  Eyes: Conjunctivae and EOM are normal. Pupils are equal,  round, and reactive to light.  No conjunctival injection, extraocular movement is  intact without pain or diplopia. No abnormal uptake on fluorescein stain.  Neck: Normal range of motion.  Cardiovascular: Normal rate, regular rhythm and intact distal pulses.   Pulmonary/Chest: Breath sounds normal. No respiratory distress.  Abdominal: Soft. There is no tenderness.  Musculoskeletal: Normal range of motion.  Neurological: She is alert and oriented to person, place, and time.  Skin: She is not diaphoretic.  Psychiatric: She has a normal mood and affect.  Nursing note and vitals reviewed.    ED Treatments / Results  Labs (all labs ordered are listed, but only abnormal results are displayed) Labs Reviewed - No data to display  EKG  EKG Interpretation None       Radiology No results found.  Procedures Procedures (including critical care time)  Medications Ordered in ED Medications  fluorescein ophthalmic strip 1 strip (1 strip Both Eyes Given 04/16/16 0803)  tetracaine (PONTOCAINE) 0.5 % ophthalmic solution 2 drop (2 drops Right Eye Given 04/16/16 1610)     Initial Impression / Assessment and Plan / ED Course  I have reviewed the triage vital signs and the nursing notes.  Pertinent labs & imaging results that were available during my care of the patient were reviewed by me and considered in my medical decision making (see chart for details).  Clinical Course    Vitals:   04/16/16 0645 04/16/16 0652 04/16/16 0828  BP: 137/93  139/83  Pulse: 90  94  Resp: 18  16  Temp: 97.4 F (36.3 C)  98 F (36.7 C)  TempSrc: Oral  Oral  SpO2: 97%  99%  Weight:  86.2 kg   Height:  5\' 6"  (1.676 m)     Medications  fluorescein ophthalmic strip 1 strip (1 strip Both Eyes Given 04/16/16 0803)  tetracaine (PONTOCAINE) 0.5 % ophthalmic solution 2 drop (2 drops Right Eye Given 04/16/16 0803)    Kayla Preston is 65 y.o. female presenting with Eye redness and swelling, no conjunctival  abrasion. Likely conjunctivitis, patient started on Polytrim and given ophthalmologic referral.  Evaluation does not show pathology that would require ongoing emergent intervention or inpatient treatment. Pt is hemodynamically stable and mentating appropriately. Discussed findings and plan with patient/guardian, who agrees with care plan. All questions answered. Return precautions discussed and outpatient follow up given.      Final Clinical Impressions(s) / ED Diagnoses   Final diagnoses:  Conjunctivitis of both eyes, unspecified conjunctivitis type    New Prescriptions Discharge Medication List as of 04/16/2016  8:30 AM    START taking these medications   Details  trimethoprim-polymyxin b (POLYTRIM) ophthalmic solution Place 1 drop into the right eye every 4 (four) hours., Starting Fri 04/16/2016, Print         [employer], PA-C 04/16/16 9604    Doug Sou,  MD 04/16/16 1651

## 2016-04-16 NOTE — ED Triage Notes (Signed)
Pt reports eye drainage since Sunday night, pt states went to pharmacy and used eye wash with no relief

## 2016-04-16 NOTE — Discharge Instructions (Signed)
°  Follow with the eye doctor in the next 24 to 28 hours  Do not reuse your contact lenses and do not use any contact lenses until you are cleared by the eye doctor.   Wash your hands frequently and try to keep your hands away from the affected eye(s).   You should be feeling some improvement by 48 hours. If symptoms worsen, you develop pain, change in your vision or no improvement in 48 hours please follow with the ophthalmologist or, if that is not possible, return to the emergency room for a recheck.

## 2016-05-17 ENCOUNTER — Encounter: Payer: Self-pay | Admitting: Internal Medicine

## 2016-05-17 ENCOUNTER — Ambulatory Visit: Payer: BLUE CROSS/BLUE SHIELD | Attending: Internal Medicine | Admitting: Internal Medicine

## 2016-05-17 VITALS — BP 130/80 | HR 76 | Temp 98.0°F | Resp 16 | Wt 206.6 lb

## 2016-05-17 DIAGNOSIS — I10 Essential (primary) hypertension: Secondary | ICD-10-CM | POA: Insufficient documentation

## 2016-05-17 DIAGNOSIS — Z114 Encounter for screening for human immunodeficiency virus [HIV]: Secondary | ICD-10-CM | POA: Diagnosis not present

## 2016-05-17 DIAGNOSIS — Z0000 Encounter for general adult medical examination without abnormal findings: Secondary | ICD-10-CM

## 2016-05-17 DIAGNOSIS — H538 Other visual disturbances: Secondary | ICD-10-CM | POA: Diagnosis not present

## 2016-05-17 DIAGNOSIS — Z1159 Encounter for screening for other viral diseases: Secondary | ICD-10-CM

## 2016-05-17 DIAGNOSIS — Z7982 Long term (current) use of aspirin: Secondary | ICD-10-CM | POA: Insufficient documentation

## 2016-05-17 DIAGNOSIS — E669 Obesity, unspecified: Secondary | ICD-10-CM | POA: Diagnosis not present

## 2016-05-17 DIAGNOSIS — E559 Vitamin D deficiency, unspecified: Secondary | ICD-10-CM | POA: Insufficient documentation

## 2016-05-17 DIAGNOSIS — I4891 Unspecified atrial fibrillation: Secondary | ICD-10-CM | POA: Diagnosis not present

## 2016-05-17 DIAGNOSIS — Z1211 Encounter for screening for malignant neoplasm of colon: Secondary | ICD-10-CM

## 2016-05-17 LAB — CMP AND LIVER
ALT: 21 U/L (ref 6–29)
AST: 18 U/L (ref 10–35)
Albumin: 4.2 g/dL (ref 3.6–5.1)
Alkaline Phosphatase: 73 U/L (ref 33–130)
BILIRUBIN DIRECT: 0.1 mg/dL (ref <=0.2)
BILIRUBIN TOTAL: 0.4 mg/dL (ref 0.2–1.2)
BUN: 18 mg/dL (ref 7–25)
CO2: 22 mmol/L (ref 20–31)
CREATININE: 0.89 mg/dL (ref 0.50–0.99)
Calcium: 9.6 mg/dL (ref 8.6–10.4)
Chloride: 107 mmol/L (ref 98–110)
GLUCOSE: 90 mg/dL (ref 65–99)
Indirect Bilirubin: 0.3 mg/dL (ref 0.2–1.2)
Potassium: 4.3 mmol/L (ref 3.5–5.3)
SODIUM: 140 mmol/L (ref 135–146)
TOTAL PROTEIN: 7 g/dL (ref 6.1–8.1)

## 2016-05-17 LAB — CBC WITH DIFFERENTIAL/PLATELET
BASOS ABS: 0 {cells}/uL (ref 0–200)
Basophils Relative: 0 %
EOS ABS: 120 {cells}/uL (ref 15–500)
Eosinophils Relative: 3 %
HEMATOCRIT: 43.9 % (ref 35.0–45.0)
Hemoglobin: 13.5 g/dL (ref 11.7–15.5)
LYMPHS PCT: 43 %
Lymphs Abs: 1720 cells/uL (ref 850–3900)
MCH: 23.6 pg — AB (ref 27.0–33.0)
MCHC: 30.8 g/dL — ABNORMAL LOW (ref 32.0–36.0)
MCV: 76.6 fL — AB (ref 80.0–100.0)
MONO ABS: 320 {cells}/uL (ref 200–950)
MPV: 10.2 fL (ref 7.5–12.5)
Monocytes Relative: 8 %
NEUTROS PCT: 46 %
Neutro Abs: 1840 cells/uL (ref 1500–7800)
PLATELETS: 259 10*3/uL (ref 140–400)
RBC: 5.73 MIL/uL — ABNORMAL HIGH (ref 3.80–5.10)
RDW: 16.4 % — AB (ref 11.0–15.0)
WBC: 4 10*3/uL (ref 3.8–10.8)

## 2016-05-17 LAB — LIPID PANEL
Cholesterol: 197 mg/dL (ref <200)
HDL: 58 mg/dL (ref >50)
LDL Cholesterol: 121 mg/dL — ABNORMAL HIGH (ref <100)
Total CHOL/HDL Ratio: 3.4 Ratio (ref <5.0)
Triglycerides: 91 mg/dL (ref <150)
VLDL: 18 mg/dL (ref <30)

## 2016-05-17 NOTE — Patient Instructions (Addendum)
Calcium '1200mg'$  daily + Vit D 800 IU /day    -  Health Maintenance, Female Introduction Adopting a healthy lifestyle and getting preventive care can go a long way to promote health and wellness. Talk with your health care provider about what schedule of regular examinations is right for you. This is a good chance for you to check in with your provider about disease prevention and staying healthy. In between checkups, there are plenty of things you can do on your own. Experts have done a lot of research about which lifestyle changes and preventive measures are most likely to keep you healthy. Ask your health care provider for more information. Weight and diet Eat a healthy diet  Be sure to include plenty of vegetables, fruits, low-fat dairy products, and lean protein.  Do not eat a lot of foods high in solid fats, added sugars, or salt.  Get regular exercise. This is one of the most important things you can do for your health.  Most adults should exercise for at least 150 minutes each week. The exercise should increase your heart rate and make you sweat (moderate-intensity exercise).  Most adults should also do strengthening exercises at least twice a week. This is in addition to the moderate-intensity exercise. Maintain a healthy weight  Body mass index (BMI) is a measurement that can be used to identify possible weight problems. It estimates body fat based on height and weight. Your health care provider can help determine your BMI and help you achieve or maintain a healthy weight.  For females 64 years of age and older:  A BMI below 18.5 is considered underweight.  A BMI of 18.5 to 24.9 is normal.  A BMI of 25 to 29.9 is considered overweight.  A BMI of 30 and above is considered obese. Watch levels of cholesterol and blood lipids  You should start having your blood tested for lipids and cholesterol at 64 years of age, then have this test every 5 years.  You may need to have  your cholesterol levels checked more often if:  Your lipid or cholesterol levels are high.  You are older than 64 years of age.  You are at high risk for heart disease. Cancer screening Lung Cancer  Lung cancer screening is recommended for adults 49-43 years old who are at high risk for lung cancer because of a history of smoking.  A yearly low-dose CT scan of the lungs is recommended for people who:  Currently smoke.  Have quit within the past 15 years.  Have at least a 30-pack-year history of smoking. A pack year is smoking an average of one pack of cigarettes a day for 1 year.  Yearly screening should continue until it has been 15 years since you quit.  Yearly screening should stop if you develop a health problem that would prevent you from having lung cancer treatment. Breast Cancer  Practice breast self-awareness. This means understanding how your breasts normally appear and feel.  It also means doing regular breast self-exams. Let your health care provider know about any changes, no matter how small.  If you are in your 20s or 30s, you should have a clinical breast exam (CBE) by a health care provider every 1-3 years as part of a regular health exam.  If you are 55 or older, have a CBE every year. Also consider having a breast X-ray (mammogram) every year.  If you have a family history of breast cancer, talk to your health care  provider about genetic screening.  If you are at high risk for breast cancer, talk to your health care provider about having an MRI and a mammogram every year.  Breast cancer gene (BRCA) assessment is recommended for women who have family members with BRCA-related cancers. BRCA-related cancers include:  Breast.  Ovarian.  Tubal.  Peritoneal cancers.  Results of the assessment will determine the need for genetic counseling and BRCA1 and BRCA2 testing. Cervical Cancer  Your health care provider may recommend that you be screened regularly  for cancer of the pelvic organs (ovaries, uterus, and vagina). This screening involves a pelvic examination, including checking for microscopic changes to the surface of your cervix (Pap test). You may be encouraged to have this screening done every 3 years, beginning at age 21.  For women ages 30-65, health care providers may recommend pelvic exams and Pap testing every 3 years, or they may recommend the Pap and pelvic exam, combined with testing for human papilloma virus (HPV), every 5 years. Some types of HPV increase your risk of cervical cancer. Testing for HPV may also be done on women of any age with unclear Pap test results.  Other health care providers may not recommend any screening for nonpregnant women who are considered low risk for pelvic cancer and who do not have symptoms. Ask your health care provider if a screening pelvic exam is right for you.  If you have had past treatment for cervical cancer or a condition that could lead to cancer, you need Pap tests and screening for cancer for at least 20 years after your treatment. If Pap tests have been discontinued, your risk factors (such as having a new sexual partner) need to be reassessed to determine if screening should resume. Some women have medical problems that increase the chance of getting cervical cancer. In these cases, your health care provider may recommend more frequent screening and Pap tests. Colorectal Cancer  This type of cancer can be detected and often prevented.  Routine colorectal cancer screening usually begins at 64 years of age and continues through 64 years of age.  Your health care provider may recommend screening at an earlier age if you have risk factors for colon cancer.  Your health care provider may also recommend using home test kits to check for hidden blood in the stool.  A small camera at the end of a tube can be used to examine your colon directly (sigmoidoscopy or colonoscopy). This is done to check  for the earliest forms of colorectal cancer.  Routine screening usually begins at age 50.  Direct examination of the colon should be repeated every 5-10 years through 64 years of age. However, you may need to be screened more often if early forms of precancerous polyps or small growths are found. Skin Cancer  Check your skin from head to toe regularly.  Tell your health care provider about any new moles or changes in moles, especially if there is a change in a mole's shape or color.  Also tell your health care provider if you have a mole that is larger than the size of a pencil eraser.  Always use sunscreen. Apply sunscreen liberally and repeatedly throughout the day.  Protect yourself by wearing long sleeves, pants, a wide-brimmed hat, and sunglasses whenever you are outside. Heart disease, diabetes, and high blood pressure  High blood pressure causes heart disease and increases the risk of stroke. High blood pressure is more likely to develop in:  People who   have blood pressure in the high end of the normal range (130-139/85-89 mm Hg).  People who are overweight or obese.  People who are African American.  If you are 66-44 years of age, have your blood pressure checked every 3-5 years. If you are 81 years of age or older, have your blood pressure checked every year. You should have your blood pressure measured twice-once when you are at a hospital or clinic, and once when you are not at a hospital or clinic. Record the average of the two measurements. To check your blood pressure when you are not at a hospital or clinic, you can use:  An automated blood pressure machine at a pharmacy.  A home blood pressure monitor.  If you are between 53 years and 57 years old, ask your health care provider if you should take aspirin to prevent strokes.  Have regular diabetes screenings. This involves taking a blood sample to check your fasting blood sugar level.  If you are at a normal weight  and have a low risk for diabetes, have this test once every three years after 64 years of age.  If you are overweight and have a high risk for diabetes, consider being tested at a younger age or more often. Preventing infection Hepatitis B  If you have a higher risk for hepatitis B, you should be screened for this virus. You are considered at high risk for hepatitis B if:  You were born in a country where hepatitis B is common. Ask your health care provider which countries are considered high risk.  Your parents were born in a high-risk country, and you have not been immunized against hepatitis B (hepatitis B vaccine).  You have HIV or AIDS.  You use needles to inject street drugs.  You live with someone who has hepatitis B.  You have had sex with someone who has hepatitis B.  You get hemodialysis treatment.  You take certain medicines for conditions, including cancer, organ transplantation, and autoimmune conditions. Hepatitis C  Blood testing is recommended for:  Everyone born from [birth year] through 1963.  Anyone with known risk factors for hepatitis C. Sexually transmitted infections (STIs)  You should be screened for sexually transmitted infections (STIs) including gonorrhea and chlamydia if:  You are sexually active and are younger than 64 years of age.  You are older than 64 years of age and your health care provider tells you that you are at risk for this type of infection.  Your sexual activity has changed since you were last screened and you are at an increased risk for chlamydia or gonorrhea. Ask your health care provider if you are at risk.  If you do not have HIV, but are at risk, it may be recommended that you take a prescription medicine daily to prevent HIV infection. This is called pre-exposure prophylaxis (PrEP). You are considered at risk if:  You are sexually active and do not regularly use condoms or know the HIV status of your partner(s).  You take drugs by  injection.  You are sexually active with a partner who has HIV. Talk with your health care provider about whether you are at high risk of being infected with HIV. If you choose to begin PrEP, you should first be tested for HIV. You should then be tested every 3 months for as long as you are taking PrEP. Pregnancy  If you are premenopausal and you may become pregnant, ask your health care provider about preconception counseling.  If you may become pregnant, take 400 to 800 micrograms (mcg) of folic acid every day.  If you want to prevent pregnancy, talk to your health care provider about birth control (contraception). Osteoporosis and menopause  Osteoporosis is a disease in which the bones lose minerals and strength with aging. This can result in serious bone fractures. Your risk for osteoporosis can be identified using a bone density scan.  If you are 84 years of age or older, or if you are at risk for osteoporosis and fractures, ask your health care provider if you should be screened.  Ask your health care provider whether you should take a calcium or vitamin D supplement to lower your risk for osteoporosis.  Menopause may have certain physical symptoms and risks.  Hormone replacement therapy may reduce some of these symptoms and risks. Talk to your health care provider about whether hormone replacement therapy is right for you. Follow these instructions at home:  Schedule regular health, dental, and eye exams.  Stay current with your immunizations.  Do not use any tobacco products including cigarettes, chewing tobacco, or electronic cigarettes.  If you are pregnant, do not drink alcohol.  If you are breastfeeding, limit how much and how often you drink alcohol.  Limit alcohol intake to no more than 1 drink per day for nonpregnant women. One drink equals 12 ounces of beer, 5 ounces of wine, or 1 ounces of hard liquor.  Do not use street drugs.  Do not share needles.  Ask  your health care provider for help if you need support or information about quitting drugs.  Tell your health care provider if you often feel depressed.  Tell your health care provider if you have ever been abused or do not feel safe at home. This information is not intended to replace advice given to you by your health care provider. Make sure you discuss any questions you have with your health care provider. Document Released: 11/09/2010 Document Revised: 10/02/2015 Document Reviewed: 01/28/2015  2017 Elsevier

## 2016-05-17 NOTE — Progress Notes (Signed)
Kayla Preston, is a 66 y.o. female  [id number]  [id number]  DOB - 09-10-1950  Chief Complaint  Patient presents with  . Blurred Vision        Subjective:   Kayla Preston is a 66 y.o. female here today for a follow up visit.  Last seen in clinic 3/17 for health-maintenance and borderline bp.  Since than, she is doing much better overall, both mentally and physically.  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 She showed me numerous pictures of her garden today, very proud of it and I don't blame her. She has also gotten more active, and loss some weight as well.  Co of some mild blurry vision on her left eye, last eyeglass rx was Preston years ago, and thinks may be due to that.  Of note, she was seen in ED 04/16/16 for conjunctivitis, prx eye gtt, but did not note any improvement.  Decline flu vac, but has had her tdap and zostavax in 2016 w/ [facility].  Patient has No headache, No chest pain, No abdominal pain - No Nausea, No new weakness tingling or numbness, No Cough - SOB.  No problems updated.  ALLERGIES: No Known Allergies  PAST MEDICAL HISTORY: Past Medical History:  Diagnosis Date  . Atrial fibrillation (HCC)   . Atrial fibrillation (HCC)    she had ablation which resolved intermittent afib  . Obesity   . Seasonal allergies     MEDICATIONS AT HOME: Prior to Admission medications   Medication Sig Start Date End Date Taking? Authorizing Provider  aspirin EC 81 MG tablet Take 81 mg by mouth daily.   Yes Historical Provider, MD  Cyanocobalamin (VITAMIN B 12 PO) Take 1 tablet by mouth daily. Reported on 07/22/2015   Yes Historical Provider, MD  Multiple Vitamin (MULITIVITAMIN WITH MINERALS) TABS Take 1 tablet by mouth every other day.    Yes Historical Provider, MD  Multiple Vitamins-Minerals (EMERGEN-C VITAMIN C PO) Take 1 tablet by mouth daily as needed (cold symptoms). Reported on 07/22/2015   Yes Historical Provider, MD    DM-Phenylephrine-Acetaminophen (THERAFLU WARMING RELIEF DAY PO) Take 1 packet by mouth every 4 (four) hours as needed (for flu symptoms). Reported on 07/22/2015    Historical Provider, MD  ECHINACEA PO Take 1 tablet by mouth daily. Reported on 07/22/2015    Historical Provider, MD  ibuprofen (ADVIL,MOTRIN) 800 MG tablet Take 1 tablet (800 mg total) by mouth 3 (three) times daily. Patient not taking: Reported on 05/17/2016 05/11/15   Ace Gins Sam, PA-C  meclizine (ANTIVERT) 50 MG tablet Take 1 tablet (50 mg total) by mouth 3 (three) times daily as needed for dizziness or nausea. Patient not taking: Reported on 05/17/2016 02/07/15   Cheri Fowler, PA-C  Phenylephrine-APAP-Guaifenesin (TYLENOL SINUS SEVERE PO) Take 1 tablet by mouth every 4 (four) hours as needed (for pain/congestion). Reported on 07/22/2015    Historical Provider, MD  trimethoprim-polymyxin b (POLYTRIM) ophthalmic solution Place 1 drop into the right eye every 4 (four) hours. Patient not taking: Reported on 05/17/2016 04/16/16   Joni Reining Pisciotta, PA-C     Objective:   Vitals:   05/17/16 0930  BP: 130/80  Pulse: 76  Resp: 16  Temp: 98 F (36.7 C)  TempSrc: Oral  SpO2: 97%  Weight: 206 lb 9.6 oz (93.7 kg)    Exam General appearance : Awake, alert, not in any distress. Speech Clear. Not toxic looking, great spirits, pleasant. HEENT: Atraumatic and Normocephalic, pupils  equally reactive to light.  bilat TMs clear. Neck: supple, no JVD.  Chest:Good air entry bilaterally, no added sounds. CVS: S1 S2 regular, no murmurs/gallups or rubs. Abdomen: Bowel sounds active, Non tender and not distended with no gaurding, rigidity or rebound. Extremities: B/L Lower Ext shows no edema, both legs are warm to touch Neurology: Awake alert, and oriented X 3, CN II-XII grossly intact, Non focal Skin:No Rash  Data Review Lab Results  Component Value Date   HGBA1C 5 8 07/22/2015    Depression screen Kayla Preston/9 05/17/2016 07/22/2015  Decreased Interest  1 0  Down, Depressed, Hopeless (No Data) 0  PHQ - Preston Score 1 0      Assessment & Plan   1. Blurry vision, left eye - suspect needs stronger prx, but will refer to optho if covered w/ her current isurance. - Ambulatory referral to Ophthalmology  Preston. Vitamin D deficiency Finished rx, recd calcium 1200mg  /day and vit d 800iu daily if vit d levels nml given postmenopausal state/low estrogen. - VITAMIN D 25 Hydroxy (Vit-D Deficiency, Fractures)  3. Need for hepatitis C screening test - Hepatitis C antibody  4. Encounter for screening for HIV - HIV antibody (with reflex)  5. Colon cancer screening Per pt, last screening was about 10 years ago. - Ambulatory referral to Gastroenterology  6. Health maintenance examination - Lipid Panel - CMP and Liver - CBC with Differential  7. Hx of htn, but resolved now since more active - encouraged her w/ her gardening, she is doing great!    Patient have been counseled extensively about nutrition and exercise  Return in about 4 weeks (around Preston/09/2016) for papsmear.  The patient was given clear instructions to go to ER or return to medical center if symptoms don't improve, worsen or new problems develop. The patient verbalized understanding. The patient was told to call to get lab results if they haven't heard anything in the next week.   This note has been created with Education officer, environmentalDragon speech recognition software and smart phrase technology. Any transcriptional errors are unintentional.   Kayla Glatterawn T Marcelia Petersen, MD, MBA/MHA [facility] [place], [place] [phone]   05/17/2016, 11:02 AM

## 2016-05-18 LAB — HIV ANTIBODY (ROUTINE TESTING W REFLEX): HIV 1&2 Ab, 4th Generation: NONREACTIVE

## 2016-05-18 LAB — VITAMIN D 25 HYDROXY (VIT D DEFICIENCY, FRACTURES): VIT D 25 HYDROXY: 33 ng/mL (ref 30–100)

## 2016-05-18 LAB — HEPATITIS C ANTIBODY: HCV Ab: NEGATIVE

## 2016-05-19 ENCOUNTER — Telehealth: Payer: Self-pay | Admitting: Internal Medicine

## 2016-05-19 ENCOUNTER — Telehealth: Payer: Self-pay

## 2016-05-19 NOTE — Telephone Encounter (Signed)
Patient called for lab results. Please follow up. °

## 2016-05-19 NOTE — Telephone Encounter (Signed)
Contacted pt to go over lab results pt doesn't have a vm set up

## 2016-05-20 NOTE — Telephone Encounter (Signed)
Pt. Returned nurse call regarding her results. Please f/u  °

## 2016-05-20 NOTE — Telephone Encounter (Signed)
Returned pt call and made aware of results and doesn't have any questions or concerns

## 2016-06-03 ENCOUNTER — Ambulatory Visit: Payer: BLUE CROSS/BLUE SHIELD | Attending: Internal Medicine

## 2016-06-03 DIAGNOSIS — Z23 Encounter for immunization: Secondary | ICD-10-CM

## 2016-06-16 ENCOUNTER — Other Ambulatory Visit (HOSPITAL_COMMUNITY)
Admission: RE | Admit: 2016-06-16 | Discharge: 2016-06-16 | Disposition: A | Discharge status: Home / Self Care | Payer: BLUE CROSS/BLUE SHIELD | Source: Ambulatory Visit | Attending: Internal Medicine | Admitting: Internal Medicine

## 2016-06-16 ENCOUNTER — Other Ambulatory Visit: Payer: Self-pay | Admitting: Internal Medicine

## 2016-06-16 ENCOUNTER — Ambulatory Visit: Payer: BLUE CROSS/BLUE SHIELD | Attending: Internal Medicine | Admitting: Internal Medicine

## 2016-06-16 ENCOUNTER — Encounter: Payer: Self-pay | Admitting: Internal Medicine

## 2016-06-16 VITALS — BP 131/84 | HR 97 | Temp 98.4°F | Resp 16 | Wt 208.6 lb

## 2016-06-16 DIAGNOSIS — Z1151 Encounter for screening for human papillomavirus (HPV): Secondary | ICD-10-CM | POA: Diagnosis present

## 2016-06-16 DIAGNOSIS — N841 Polyp of cervix uteri: Secondary | ICD-10-CM | POA: Diagnosis present

## 2016-06-16 DIAGNOSIS — I4891 Unspecified atrial fibrillation: Secondary | ICD-10-CM | POA: Diagnosis not present

## 2016-06-16 DIAGNOSIS — Z124 Encounter for screening for malignant neoplasm of cervix: Secondary | ICD-10-CM

## 2016-06-16 DIAGNOSIS — Z01419 Encounter for gynecological examination (general) (routine) without abnormal findings: Secondary | ICD-10-CM | POA: Diagnosis present

## 2016-06-16 DIAGNOSIS — E669 Obesity, unspecified: Secondary | ICD-10-CM | POA: Diagnosis not present

## 2016-06-16 DIAGNOSIS — Z7689 Persons encountering health services in other specified circumstances: Secondary | ICD-10-CM | POA: Insufficient documentation

## 2016-06-16 DIAGNOSIS — Z113 Encounter for screening for infections with a predominantly sexual mode of transmission: Secondary | ICD-10-CM | POA: Insufficient documentation

## 2016-06-16 MED ORDER — KETOCONAZOLE 2 % EX CREA: 1.0000 "application " | TOPICAL_CREAM | Freq: Every day | CUTANEOUS | 0 refills | Status: DC

## 2016-06-16 NOTE — Patient Instructions (Signed)
Low-Sodium Eating Plan Sodium raises blood pressure and causes water to be held in the body. Getting less sodium from food will help lower your blood pressure, reduce any swelling, and protect your heart, liver, and kidneys. We get sodium by adding salt (sodium chloride) to food. Most of our sodium comes from canned, boxed, and frozen foods. Restaurant foods, fast foods, and pizza are also very high in sodium. Even if you take medicine to lower your blood pressure or to reduce fluid in your body, getting less sodium from your food is important. What is my plan? Most people should limit their sodium intake to 2,300 mg a day. Your health care provider recommends that you limit your sodium intake to '000mg'$  a day. What do I need to know about this eating plan? For the low-sodium eating plan, you will follow these general guidelines:  Choose foods with a % Daily Value for sodium of less than 5% (as listed on the food label).  Use salt-free seasonings or herbs instead of table salt or sea salt.  Check with your health care provider or pharmacist before using salt substitutes.  Eat fresh foods.  Eat more vegetables and fruits.  Limit canned vegetables. If you do use them, rinse them well to decrease the sodium.  Limit cheese to 1 oz (28 g) per day.  Eat lower-sodium products, often labeled as "lower sodium" or "no salt added."  Avoid foods that contain monosodium glutamate (MSG). MSG is sometimes added to [place] food and some canned foods.  Check food labels (Nutrition Facts labels) on foods to learn how much sodium is in one serving.  Eat more home-cooked food and less restaurant, buffet, and fast food.  When eating at a restaurant, ask that your food be prepared with less salt, or no salt if possible. How do I read food labels for sodium information? The Nutrition Facts label lists the amount of sodium in one serving of the food. If you eat more than one serving, you must multiply the  listed amount of sodium by the number of servings. Food labels may also identify foods as:  Sodium free-Less than 5 mg in a serving.  Very low sodium-35 mg or less in a serving.  Low sodium-140 mg or less in a serving.  Light in sodium-50% less sodium in a serving. For example, if a food that usually has 300 mg of sodium is changed to become light in sodium, it will have 150 mg of sodium.  Reduced sodium-25% less sodium in a serving. For example, if a food that usually has 400 mg of sodium is changed to reduced sodium, it will have 300 mg of sodium. What foods can I eat? Grains  Low-sodium cereals, including oats, puffed wheat and rice, and shredded wheat cereals. Low-sodium crackers. Unsalted rice and pasta. Lower-sodium bread. Vegetables  Frozen or fresh vegetables. Low-sodium or reduced-sodium canned vegetables. Low-sodium or reduced-sodium tomato sauce and paste. Low-sodium or reduced-sodium tomato and vegetable juices. Fruits  Fresh, frozen, and canned fruit. Fruit juice. Meat and Other Protein Products  Low-sodium canned tuna and salmon. Fresh or frozen meat, poultry, seafood, and fish. Lamb. Unsalted nuts. Dried beans, peas, and lentils without added salt. Unsalted canned beans. Homemade soups without salt. Eggs. Dairy  Milk. Soy milk. Ricotta cheese. Low-sodium or reduced-sodium cheeses. Yogurt. Condiments  Fresh and dried herbs and spices. Salt-free seasonings. Onion and garlic powders. Low-sodium varieties of mustard and ketchup. Fresh or refrigerated horseradish. Lemon juice. Fats and Oils  Reduced-sodium  salad dressings. Unsalted butter. Other  Unsalted popcorn and pretzels. The items listed above may not be a complete list of recommended foods or beverages. Contact your dietitian for more options.  What foods are not recommended? Grains  Instant hot cereals. Bread stuffing, pancake, and biscuit mixes. Croutons. Seasoned rice or pasta mixes. Noodle soup cups. Boxed or  frozen macaroni and cheese. Self-rising flour. Regular salted crackers. Vegetables  Regular canned vegetables. Regular canned tomato sauce and paste. Regular tomato and vegetable juices. Frozen vegetables in sauces. Salted [place] fries. Olives. Angie Fava. Relishes. Sauerkraut. Salsa. Meat and Other Protein Products  Salted, canned, smoked, spiced, or pickled meats, seafood, or fish. Bacon, ham, sausage, hot dogs, corned beef, chipped beef, and packaged luncheon meats. Salt pork. Jerky. Pickled herring. Anchovies, regular canned tuna, and sardines. Salted nuts. Dairy  Processed cheese and cheese spreads. Cheese curds. Blue cheese and cottage cheese. Buttermilk. Condiments  Onion and garlic salt, seasoned salt, table salt, and sea salt. Canned and packaged gravies. Worcestershire sauce. Tartar sauce. Barbecue sauce. Teriyaki sauce. Soy sauce, including reduced sodium. Steak sauce. Fish sauce. Oyster sauce. Cocktail sauce. Horseradish that you find on the shelf. Regular ketchup and mustard. Meat flavorings and tenderizers. Bouillon cubes. Hot sauce. Tabasco sauce. Marinades. Taco seasonings. Relishes. Fats and Oils  Regular salad dressings. Salted butter. Margarine. Ghee. Bacon fat. Other  Potato and tortilla chips. Corn chips and puffs. Salted popcorn and pretzels. Canned or dried soups. Pizza. Frozen entrees and pot pies. The items listed above may not be a complete list of foods and beverages to avoid. Contact your dietitian for more information.  This information is not intended to replace advice given to you by your health care provider. Make sure you discuss any questions you have with your health care provider. Document Released: 10/16/2001 Document Revised: 10/02/2015 Document Reviewed: 02/28/2013 Elsevier Interactive Patient Education  2017 [place] Maintenance for Postmenopausal Women Introduction Menopause is a normal process in which your reproductive ability comes to an  end. This process happens gradually over a span of months to years, usually between the ages of 34 and 56. Menopause is complete when you have missed 12 consecutive menstrual periods. It is important to talk with your health care provider about some of the most common conditions that affect postmenopausal women, such as heart disease, cancer, and bone loss (osteoporosis). Adopting a healthy lifestyle and getting preventive care can help to promote your health and wellness. Those actions can also lower your chances of developing some of these common conditions. What should I know about menopause? During menopause, you may experience a number of symptoms, such as:  Moderate-to-severe hot flashes.  Night sweats.  Decrease in sex drive.  Mood swings.  Headaches.  Tiredness.  Irritability.  Memory problems.  Insomnia. Choosing to treat or not to treat menopausal changes is an individual decision that you make with your health care provider. What should I know about hormone replacement therapy and supplements? Hormone therapy products are effective for treating symptoms that are associated with menopause, such as hot flashes and night sweats. Hormone replacement carries certain risks, especially as you become older. If you are thinking about using estrogen or estrogen with progestin treatments, discuss the benefits and risks with your health care provider. What should I know about heart disease and stroke? Heart disease, heart attack, and stroke become more likely as you age. This may be due, in part, to the hormonal changes that your body experiences during menopause.  These can affect how your body processes dietary fats, triglycerides, and cholesterol. Heart attack and stroke are both medical emergencies. There are many things that you can do to help prevent heart disease and stroke:  Have your blood pressure checked at least every 1-2 years. High blood pressure causes heart disease and  increases the risk of stroke.  If you are 20-67 years old, ask your health care provider if you should take aspirin to prevent a heart attack or a stroke.  Do not use any tobacco products, including cigarettes, chewing tobacco, or electronic cigarettes. If you need help quitting, ask your health care provider.  It is important to eat a healthy diet and maintain a healthy weight.  Be sure to include plenty of vegetables, fruits, low-fat dairy products, and lean protein.  Avoid eating foods that are high in solid fats, added sugars, or salt (sodium).  Get regular exercise. This is one of the most important things that you can do for your health.  Try to exercise for at least 150 minutes each week. The type of exercise that you do should increase your heart rate and make you sweat. This is known as moderate-intensity exercise.  Try to do strengthening exercises at least twice each week. Do these in addition to the moderate-intensity exercise.  Know your numbers.Ask your health care provider to check your cholesterol and your blood glucose. Continue to have your blood tested as directed by your health care provider. What should I know about cancer screening? There are several types of cancer. Take the following steps to reduce your risk and to catch any cancer development as early as possible. Breast Cancer  Practice breast self-awareness.  This means understanding how your breasts normally appear and feel.  It also means doing regular breast self-exams. Let your health care provider know about any changes, no matter how small.  If you are 18 or older, have a clinician do a breast exam (clinical breast exam or CBE) every year. Depending on your age, family history, and medical history, it may be recommended that you also have a yearly breast X-ray (mammogram).  If you have a family history of breast cancer, talk with your health care provider about genetic screening.  If you are at high  risk for breast cancer, talk with your health care provider about having an MRI and a mammogram every year.  Breast cancer (BRCA) gene test is recommended for women who have family members with BRCA-related cancers. Results of the assessment will determine the need for genetic counseling and BRCA1 and for BRCA2 testing. BRCA-related cancers include these types:  Breast. This occurs in males or females.  Ovarian.  Tubal. This may also be called fallopian tube cancer.  Cancer of the abdominal or pelvic lining (peritoneal cancer).  Prostate.  Pancreatic. Cervical, Uterine, and Ovarian Cancer  Your health care provider may recommend that you be screened regularly for cancer of the pelvic organs. These include your ovaries, uterus, and vagina. This screening involves a pelvic exam, which includes checking for microscopic changes to the surface of your cervix (Pap test).  For women ages 21-65, health care providers may recommend a pelvic exam and a Pap test every three years. For women ages 29-65, they may recommend the Pap test and pelvic exam, combined with testing for human papilloma virus (HPV), every five years. Some types of HPV increase your risk of cervical cancer. Testing for HPV may also be done on women of any age who have  unclear Pap test results.  Other health care providers may not recommend any screening for nonpregnant women who are considered low risk for pelvic cancer and have no symptoms. Ask your health care provider if a screening pelvic exam is right for you.  If you have had past treatment for cervical cancer or a condition that could lead to cancer, you need Pap tests and screening for cancer for at least 20 years after your treatment. If Pap tests have been discontinued for you, your risk factors (such as having a new sexual partner) need to be reassessed to determine if you should start having screenings again. Some women have medical problems that increase the chance of  getting cervical cancer. In these cases, your health care provider may recommend that you have screening and Pap tests more often.  If you have a family history of uterine cancer or ovarian cancer, talk with your health care provider about genetic screening.  If you have vaginal bleeding after reaching menopause, tell your health care provider.  There are currently no reliable tests available to screen for ovarian cancer. Lung Cancer  Lung cancer screening is recommended for adults 34-76 years old who are at high risk for lung cancer because of a history of smoking. A yearly low-dose CT scan of the lungs is recommended if you:  Currently smoke.  Have a history of at least 30 pack-years of smoking and you currently smoke or have quit within the past 15 years. A pack-year is smoking an average of one pack of cigarettes per day for one year. Yearly screening should:  Continue until it has been 15 years since you quit.  Stop if you develop a health problem that would prevent you from having lung cancer treatment. Colorectal Cancer  This type of cancer can be detected and can often be prevented.  Routine colorectal cancer screening usually begins at age 13 and continues through age 42.  If you have risk factors for colon cancer, your health care provider may recommend that you be screened at an earlier age.  If you have a family history of colorectal cancer, talk with your health care provider about genetic screening.  Your health care provider may also recommend using home test kits to check for hidden blood in your stool.  A small camera at the end of a tube can be used to examine your colon directly (sigmoidoscopy or colonoscopy). This is done to check for the earliest forms of colorectal cancer.  Direct examination of the colon should be repeated every 5-10 years until age 41. However, if early forms of precancerous polyps or small growths are found or if you have a family history or  genetic risk for colorectal cancer, you may need to be screened more often. Skin Cancer  Check your skin from head to toe regularly.  Monitor any moles. Be sure to tell your health care provider:  About any new moles or changes in moles, especially if there is a change in a mole's shape or color.  If you have a mole that is larger than the size of a pencil eraser.  If any of your family members has a history of skin cancer, especially at a young age, talk with your health care provider about genetic screening.  Always use sunscreen. Apply sunscreen liberally and repeatedly throughout the day.  Whenever you are outside, protect yourself by wearing long sleeves, pants, a wide-brimmed hat, and sunglasses. What should I know about osteoporosis? Osteoporosis is a  condition in which bone destruction happens more quickly than new bone creation. After menopause, you may be at an increased risk for osteoporosis. To help prevent osteoporosis or the bone fractures that can happen because of osteoporosis, the following is recommended:  If you are 66-42 years old, get at least 1,000 mg of calcium and at least 600 mg of vitamin D per day.  If you are older than age 91 but younger than age 32, get at least 1,200 mg of calcium and at least 600 mg of vitamin D per day.  If you are older than age 73, get at least 1,200 mg of calcium and at least 800 mg of vitamin D per day. Smoking and excessive alcohol intake increase the risk of osteoporosis. Eat foods that are rich in calcium and vitamin D, and do weight-bearing exercises several times each week as directed by your health care provider. What should I know about how menopause affects my mental health? Depression may occur at any age, but it is more common as you become older. Common symptoms of depression include:  Low or sad mood.  Changes in sleep patterns.  Changes in appetite or eating patterns.  Feeling an overall lack of motivation or  enjoyment of activities that you previously enjoyed.  Frequent crying spells. Talk with your health care provider if you think that you are experiencing depression. What should I know about immunizations? It is important that you get and maintain your immunizations. These include:  Tetanus, diphtheria, and pertussis (Tdap) booster vaccine.  Influenza every year before the flu season begins.  Pneumonia vaccine.  Shingles vaccine. Your health care provider may also recommend other immunizations. This information is not intended to replace advice given to you by your health care provider. Make sure you discuss any questions you have with your health care provider. Document Released: 06/18/2005 Document Revised: 11/14/2015 Document Reviewed: 01/28/2015  2017 Elsevier

## 2016-06-16 NOTE — Progress Notes (Signed)
Kayla Preston, is a 66 y.o. female  [id number]  [id number]  DOB - 12-23-1950  Chief Complaint  Patient presents with  . Gynecologic Exam        Subjective:   Kayla Preston is a 66 y.o. female here today for a follow up visit papsmear today. Not sexually active, feels well, denies any abnml breast lumps/nipple or vaginal discharge.  Pt states had prior hx of cerv polyps, and they have tried to pierce it in past.  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  Patient has No headache, No chest pain, No abdominal pain - No Nausea, No new weakness tingling or numbness, No Cough - SOB.  No problems updated.  ALLERGIES: No Known Allergies  PAST MEDICAL HISTORY: Past Medical History:  Diagnosis Date  . Atrial fibrillation (HCC)   . Atrial fibrillation (HCC)    she had ablation which resolved intermittent afib  . Obesity   . Seasonal allergies     MEDICATIONS AT HOME: Prior to Admission medications   Medication Sig Start Date End Date Taking? Authorizing Provider  aspirin EC 81 MG tablet Take 81 mg by mouth daily.    Historical Provider, MD  Cyanocobalamin (VITAMIN B 12 PO) Take 1 tablet by mouth daily. Reported on 07/22/2015    Historical Provider, MD  DM-Phenylephrine-Acetaminophen (THERAFLU WARMING RELIEF DAY PO) Take 1 packet by mouth every 4 (four) hours as needed (for flu symptoms). Reported on 07/22/2015    Historical Provider, MD  ECHINACEA PO Take 1 tablet by mouth daily. Reported on 07/22/2015    Historical Provider, MD  ibuprofen (ADVIL,MOTRIN) 800 MG tablet Take 1 tablet (800 mg total) by mouth 3 (three) times daily. Patient not taking: Reported on 05/17/2016 05/11/15   Ace Gins Sam, PA-C  ketoconazole (NIZORAL) 2 % cream Apply 1 application topically daily. 06/16/16   Pete Glatter, MD  meclizine (ANTIVERT) 50 MG tablet Take 1 tablet (50 mg total) by mouth 3 (three) times daily as needed for dizziness or  nausea. Patient not taking: Reported on 05/17/2016 02/07/15   Cheri Fowler, PA-C  Multiple Vitamin (MULITIVITAMIN WITH MINERALS) TABS Take 1 tablet by mouth every other day.     Historical Provider, MD  Multiple Vitamins-Minerals (EMERGEN-C VITAMIN C PO) Take 1 tablet by mouth daily as needed (cold symptoms). Reported on 07/22/2015    Historical Provider, MD  Phenylephrine-APAP-Guaifenesin (TYLENOL SINUS SEVERE PO) Take 1 tablet by mouth every 4 (four) hours as needed (for pain/congestion). Reported on 07/22/2015    Historical Provider, MD  trimethoprim-polymyxin b (POLYTRIM) ophthalmic solution Place 1 drop into the right eye every 4 (four) hours. Patient not taking: Reported on 05/17/2016 04/16/16   Joni Reining Pisciotta, PA-C     Objective:   Vitals:   06/16/16 0934  BP: 131/84  Pulse: 97  Resp: 16  Temp: 98.4 F (36.9 C)  TempSrc: Oral  SpO2: 96%  Weight: 208 lb 9.6 oz (94.6 kg)    Exam General appearance : Awake, alert, not in any distress. Speech Clear. Not toxic looking, obese, pleasant. HEENT: Atraumatic and Normocephalic, pupils equally reactive to light. Neck: supple, no JVD.  Chest:Good air entry bilaterally, no added sounds. Breast /axilla: bilat nml appearance, not dippling noted. No palpable masses/nodules/nipple discharge noted on exam  CVS: S1 S2 regular, no murmurs/gallups or rubs. Abdomen: Bowel sounds active, Non tender and not distended with no gaurding, rigidity or rebound. Pelvic Exam: Cervix normal  in appearance, with 2 polyps noted in cerv ox, external genitalia normal, no adnexal masses or tenderness, no cervical motion tenderness, rectovaginal septum normal, uterus normal size, shape, and consistency and vagina normal without discharge. Mild irritation/bleeding when obtaining samples.  Extremities: B/L Lower Ext shows no edema, both legs are warm to touch Neurology: Awake alert, and oriented X 3, CN II-XII grossly intact, Non focal Skin:No Rash  Data Review Lab  Results  Component Value Date   HGBA1C 5 8 07/22/2015    [facility] 06/16/2016 05/17/2016 07/22/2015  Decreased Interest 0 1 0  Down, Depressed, Hopeless 0 (No Data) 0  PHQ - 2 Score 0 1 0      Assessment & Plan   1. Pap smear for cervical cancer screening - Cytology - PAP - PAP, Thin Prep w/HPV rflx HPV Type 16/18 ([facility])  2. cerv polpy x 2 noted. Will fu w/ pap.   Patient have been counseled extensively about nutrition and exercise  Return in about 3 months (around 09/13/2016), or if symptoms worsen or fail to improve.  The patient was given clear instructions to go to ER or return to medical center if symptoms don't improve, worsen or new problems develop. The patient verbalized understanding. The patient was told to call to get lab results if they haven't heard anything in the next week.   This note has been created with Education officer, environmentalDragon speech recognition software and smart phrase technology. Any transcriptional errors are unintentional.   Pete Glatterawn T Everett Ricciardelli, MD, MBA/MHA [facility] [place], [place] [phone]   06/16/2016, 1:16 PM

## 2016-06-17 LAB — CERVICOVAGINAL ANCILLARY ONLY: WET PREP (BD AFFIRM): NEGATIVE

## 2016-06-18 LAB — CYTOLOGY - PAP
DIAGNOSIS: NEGATIVE
HPV (WINDOPATH): NOT DETECTED

## 2016-06-21 LAB — HERPES SIMPLEX VIRUS CULTURE: ORGANISM ID, BACTERIA: NOT DETECTED

## 2016-06-30 ENCOUNTER — Telehealth: Payer: Self-pay | Admitting: Internal Medicine

## 2016-06-30 ENCOUNTER — Telehealth: Payer: Self-pay

## 2016-06-30 NOTE — Telephone Encounter (Signed)
Contacted pt to go over pap results pt didn't answer lvm asking pt to give me a call at her earliest convenience   If pt calls back please give results: hpv came back negative. Neg pap smear. Repeat pap in 5 years.

## 2016-06-30 NOTE — Telephone Encounter (Signed)
Patient returned nurse call regarding her lab results. I informed patient of what nurse wrote:  hpv came back negative. Neg pap smear. Repeat pap in 5 years.  Pt understood.   Thank you.

## 2016-09-23 ENCOUNTER — Encounter: Payer: Self-pay | Admitting: Internal Medicine

## 2016-09-24 ENCOUNTER — Encounter: Payer: Self-pay | Admitting: Internal Medicine

## 2016-09-27 ENCOUNTER — Encounter: Payer: Self-pay | Admitting: Internal Medicine

## 2016-12-30 ENCOUNTER — Other Ambulatory Visit: Payer: Self-pay | Admitting: Gastroenterology

## 2016-12-30 ENCOUNTER — Ambulatory Visit
Admission: RE | Admit: 2016-12-30 | Discharge: 2016-12-30 | Disposition: A | Discharge status: Home / Self Care | Payer: BLUE CROSS/BLUE SHIELD | Source: Ambulatory Visit | Attending: Gastroenterology | Admitting: Gastroenterology

## 2016-12-30 DIAGNOSIS — R140 Abdominal distension (gaseous): Secondary | ICD-10-CM

## 2017-01-20 ENCOUNTER — Telehealth: Payer: Self-pay | Admitting: Internal Medicine

## 2017-01-20 NOTE — Telephone Encounter (Signed)
Clinic scheduler called patient and told patient that the office will be closed on Sept. 14 due to inclement weather. I told patient I could not get her back into the office until Oct. 1st at 2:15 she refused and said that that was to long to wait and that she would come and sit in the office on Monday until she could be seen. I told patient that I could not make sure that she would be seen she said she understood but she would take her chances waiting. I told patient that she could be seen at a urgent care but she said that cost to much.

## 2017-01-21 ENCOUNTER — Ambulatory Visit: Payer: BLUE CROSS/BLUE SHIELD | Admitting: Family Medicine

## 2017-03-08 ENCOUNTER — Ambulatory Visit: Payer: BLUE CROSS/BLUE SHIELD | Attending: Family Medicine | Admitting: Family Medicine

## 2017-03-08 ENCOUNTER — Encounter: Payer: Self-pay | Admitting: Family Medicine

## 2017-03-08 VITALS — BP 130/81 | HR 73 | Temp 97.4°F | Resp 18 | Ht 66.0 in | Wt 205.8 lb

## 2017-03-08 DIAGNOSIS — I10 Essential (primary) hypertension: Secondary | ICD-10-CM | POA: Insufficient documentation

## 2017-03-08 DIAGNOSIS — M25649 Stiffness of unspecified hand, not elsewhere classified: Secondary | ICD-10-CM | POA: Diagnosis not present

## 2017-03-08 DIAGNOSIS — Z7982 Long term (current) use of aspirin: Secondary | ICD-10-CM | POA: Insufficient documentation

## 2017-03-08 DIAGNOSIS — K029 Dental caries, unspecified: Secondary | ICD-10-CM | POA: Insufficient documentation

## 2017-03-08 DIAGNOSIS — L03011 Cellulitis of right finger: Secondary | ICD-10-CM | POA: Diagnosis not present

## 2017-03-08 DIAGNOSIS — M79641 Pain in right hand: Secondary | ICD-10-CM | POA: Diagnosis present

## 2017-03-08 DIAGNOSIS — K047 Periapical abscess without sinus: Secondary | ICD-10-CM | POA: Diagnosis not present

## 2017-03-08 MED ORDER — HYDROCHLOROTHIAZIDE 12.5 MG PO TABS: 12.5000 mg | ORAL_TABLET | Freq: Every day | ORAL | 2 refills | Status: DC

## 2017-03-08 MED ORDER — PENICILLIN V POTASSIUM 500 MG PO TABS: 500.0000 mg | ORAL_TABLET | Freq: Four times a day (QID) | ORAL | 0 refills | Status: DC

## 2017-03-08 MED ORDER — IBUPROFEN 600 MG PO TABS: 600.0000 mg | ORAL_TABLET | Freq: Three times a day (TID) | ORAL | 0 refills | Status: DC | PRN

## 2017-03-08 NOTE — Progress Notes (Signed)
Subjective:  Patient ID: Kayla Preston, female    DOB: 1951/02/01  Age: 66 y.o. MRN: [id number]  CC: Hand Pain   HPI Kayla Preston presents for complaints of finger swelling.  Onset 1 month ago.  She reports cutting her finger on a gardening tool.  She reports using peroxide and Neosporin.  She reports initial swelling, tenderness, redness, and crusted drainage.  She reports redness has resolved but mild swelling, tenderness, and decreased sensation persist.  She reports last tetanus vaccination was in 2016.  She complains of joint stiffness to bilateral hands.  She reports difficulty with flexion and extension of the third digit of right hand for several months.  Dental problem: Onset several months.  Location right upper molar.  Reports efforts to go to dental clinic in [place].  Reports several days ago loose tooth falling out during sleep.       Outpatient Medications Prior to Visit  Medication Sig Dispense Refill  . aspirin EC 81 MG tablet Take 81 mg by mouth daily.    . Cyanocobalamin (VITAMIN B 12 PO) Take 1 tablet by mouth daily. Reported on 07/22/2015    . Multiple Vitamin (MULITIVITAMIN WITH MINERALS) TABS Take 1 tablet by mouth every other day.     Marland Kitchen DM-Phenylephrine-Acetaminophen (THERAFLU WARMING RELIEF DAY PO) Take 1 packet by mouth every 4 (four) hours as needed (for flu symptoms). Reported on 07/22/2015    . ECHINACEA PO Take 1 tablet by mouth daily. Reported on 07/22/2015    . ketoconazole (NIZORAL) 2 % cream Apply 1 application topically daily. 15 g 0  . meclizine (ANTIVERT) 50 MG tablet Take 1 tablet (50 mg total) by mouth 3 (three) times daily as needed for dizziness or nausea. (Patient not taking: Reported on 05/17/2016) 15 tablet 0  . Multiple Vitamins-Minerals (EMERGEN-C VITAMIN C PO) Take 1 tablet by mouth daily as needed (cold symptoms). Reported on 07/22/2015    . Phenylephrine-APAP-Guaifenesin (TYLENOL SINUS SEVERE PO) Take 1 tablet by mouth every 4 (four) hours  as needed (for pain/congestion). Reported on 07/22/2015    . trimethoprim-polymyxin b (POLYTRIM) ophthalmic solution Place 1 drop into the right eye every 4 (four) hours. (Patient not taking: Reported on 05/17/2016) 10 mL 0  . ibuprofen (ADVIL,MOTRIN) 800 MG tablet Take 1 tablet (800 mg total) by mouth 3 (three) times daily. (Patient not taking: Reported on 05/17/2016) 21 tablet 0   No facility-administered medications prior to visit.     ROS Review of Systems  Constitutional: Negative.   HENT: Positive for dental problem.   Respiratory: Negative.   Cardiovascular: Negative.   Musculoskeletal: Positive for arthralgias.  Skin:       Thumb swelling    Objective:  BP 130/81   Pulse 73   Temp (!) 97.4 F (36.3 C) (Oral)   Resp 18   Ht 5\' 6"  (1.676 m)   Wt 205 lb 12.8 oz (93.4 kg)   SpO2 97%   BMI 33.22 kg/m   BP/Weight 03/08/2017 06/16/2016 05/17/2016  Systolic BP 130 131 130  Diastolic BP 81 84 80  Wt. (Lbs) 205.8 208.6 206.6  BMI 33.22 33.67 33.35     Physical Exam  Constitutional: She appears well-developed and well-nourished.  HENT:  Mouth/Throat: Abnormal dentition (premolar missing).  Cardiovascular: Normal rate, regular rhythm, normal heart sounds and intact distal pulses.   Pulmonary/Chest: Effort normal and breath sounds normal.  Abdominal: Soft. Bowel sounds are normal.  Musculoskeletal: Normal range of motion. She exhibits edema (mild  swelling to 1st digit of right hand) and tenderness ( to 1st digit of right hand).  Skin: Skin is warm and dry. No erythema.  Nursing note and vitals reviewed.    Assessment & Plan:   1. Cellulitis of finger of right hand  - penicillin v potassium (VEETID) 500 MG tablet; Take 1 tablet (500 mg total) by mouth 4 (four) times daily.  Dispense: 20 tablet; Refill: 0  2. Dental abscess  - Ambulatory referral to Dentistry - penicillin v potassium (VEETID) 500 MG tablet; Take 1 tablet (500 mg total) by mouth 4 (four) times daily.   Dispense: 20 tablet; Refill: 0  3. Dental decay  - Ambulatory referral to Dentistry  4. Stiffness of hand joint, unspecified laterality  - Rheumatoid factor - ibuprofen (ADVIL,MOTRIN) 600 MG tablet; Take 1 tablet (600 mg total) by mouth every 8 (eight) hours as needed.  Dispense: 30 tablet; Refill: 0  5. Essential hypertension Schedule BP recheck in 2 weeks with clinic RN If BP is greater than 90/60 (MAP 65 or greater) but not less than 130/80 may increase dose of hydrochlorothiazide to 25 mg daily and recheck in another 2 weeks.  Follow-up with PCP in 3 months. - hydrochlorothiazide (HYDRODIURIL) 12.5 MG tablet; Take 1 tablet (12.5 mg total) by mouth daily.  Dispense: 30 tablet; Refill: 2     Follow-up: Return in about 2 weeks (around 03/22/2017) for BP check with [facility].   Lizbeth BarkMandesia R Amillia Biffle FNP

## 2017-03-08 NOTE — Patient Instructions (Signed)
Cellulitis, Adult Cellulitis is a skin infection. The infected area is usually red and sore. This condition occurs most often in the arms and lower legs. It is very important to get treated for this condition. Follow these instructions at home:  Take over-the-counter and prescription medicines only as told by your doctor.  If you were prescribed an antibiotic medicine, take it as told by your doctor. Do not stop taking the antibiotic even if you start to feel better.  Drink enough fluid to keep your pee (urine) clear or pale yellow.  Do not touch or rub the infected area.  Raise (elevate) the infected area above the level of your heart while you are sitting or lying down.  Place warm or cold wet cloths (warm or cold compresses) on the infected area. Do this as told by your doctor.  Keep all follow-up visits as told by your doctor. This is important. These visits let your doctor make sure your infection is not getting worse. Contact a doctor if:  You have a fever.  Your symptoms do not get better after 1-2 days of treatment.  Your bone or joint under the infected area starts to hurt after the skin has healed.  Your infection comes back. This can happen in the same area or another area.  You have a swollen bump in the infected area.  You have new symptoms.  You feel ill and also have muscle aches and pains. Get help right away if:  Your symptoms get worse.  You feel very sleepy.  You throw up (vomit) or have watery poop (diarrhea) for a long time.  There are red streaks coming from the infected area.  Your red area gets larger.  Your red area turns darker. This information is not intended to replace advice given to you by your health care provider. Make sure you discuss any questions you have with your health care provider. Document Released: 10/13/2007 Document Revised: 10/02/2015 Document Reviewed: 03/05/2015 Elsevier Interactive Patient Education  2018 [employer].  DASH Eating Plan DASH stands for "Dietary Approaches to Stop Hypertension." The DASH eating plan is a healthy eating plan that has been shown to reduce high blood pressure (hypertension). It may also reduce your risk for type 2 diabetes, heart disease, and stroke. The DASH eating plan may also help with weight loss. What are tips for following this plan? General guidelines  Avoid eating more than 2,300 mg (milligrams) of salt (sodium) a day. If you have hypertension, you may need to reduce your sodium intake to 1,500 mg a day.  Limit alcohol intake to no more than 1 drink a day for nonpregnant women and 2 drinks a day for men. One drink equals 12 oz of beer, 5 oz of wine, or 1 oz of hard liquor.  Work with your health care provider to maintain a healthy body weight or to lose weight. Ask what an ideal weight is for you.  Get at least 30 minutes of exercise that causes your heart to beat faster (aerobic exercise) most days of the week. Activities may include walking, swimming, or biking.  Work with your health care provider or diet and nutrition specialist (dietitian) to adjust your eating plan to your individual calorie needs. Reading food labels  Check food labels for the amount of sodium per serving. Choose foods with less than 5 percent of the Daily Value of sodium. Generally, foods with less than 300 mg of sodium per serving fit into this eating  plan.  To find whole grains, look for the word "whole" as the first word in the ingredient list. Shopping  Buy products labeled as "low-sodium" or "no salt added."  Buy fresh foods. Avoid canned foods and premade or frozen meals. Cooking  Avoid adding salt when cooking. Use salt-free seasonings or herbs instead of table salt or sea salt. Check with your health care provider or pharmacist before using salt substitutes.  Do not fry foods. Cook foods using healthy methods such as baking, boiling, grilling, and broiling instead.  Cook  with heart-healthy oils, such as olive, canola, soybean, or sunflower oil. Meal planning   Eat a balanced diet that includes: ? 5 or more servings of fruits and vegetables each day. At each meal, try to fill half of your plate with fruits and vegetables. ? Up to 6-8 servings of whole grains each day. ? Less than 6 oz of lean meat, poultry, or fish each day. A 3-oz serving of meat is about the same size as a deck of cards. One egg equals 1 oz. ? 2 servings of low-fat dairy each day. ? A serving of nuts, seeds, or beans 5 times each week. ? Heart-healthy fats. Healthy fats called Omega-3 fatty acids are found in foods such as flaxseeds and coldwater fish, like sardines, salmon, and mackerel.  Limit how much you eat of the following: ? Canned or prepackaged foods. ? Food that is high in trans fat, such as fried foods. ? Food that is high in saturated fat, such as fatty meat. ? Sweets, desserts, sugary drinks, and other foods with added sugar. ? Full-fat dairy products.  Do not salt foods before eating.  Try to eat at least 2 vegetarian meals each week.  Eat more home-cooked food and less restaurant, buffet, and fast food.  When eating at a restaurant, ask that your food be prepared with less salt or no salt, if possible. What foods are recommended? The items listed may not be a complete list. Talk with your dietitian about what dietary choices are best for you. Grains Whole-grain or whole-wheat bread. Whole-grain or whole-wheat pasta. Brown rice. Orpah Cobb. Bulgur. Whole-grain and low-sodium cereals. Pita bread. Low-fat, low-sodium crackers. Whole-wheat flour tortillas. Vegetables Fresh or frozen vegetables (raw, steamed, roasted, or grilled). Low-sodium or reduced-sodium tomato and vegetable juice. Low-sodium or reduced-sodium tomato sauce and tomato paste. Low-sodium or reduced-sodium canned vegetables. Fruits All fresh, dried, or frozen fruit. Canned fruit in natural juice  (without added sugar). Meat and other protein foods Skinless chicken or [place]. Ground chicken or [place]. Pork with fat trimmed off. Fish and seafood. Egg whites. Dried beans, peas, or lentils. Unsalted nuts, nut butters, and seeds. Unsalted canned beans. Lean cuts of beef with fat trimmed off. Low-sodium, lean deli meat. Dairy Low-fat (1%) or fat-free (skim) milk. Fat-free, low-fat, or reduced-fat cheeses. Nonfat, low-sodium ricotta or cottage cheese. Low-fat or nonfat yogurt. Low-fat, low-sodium cheese. Fats and oils Soft margarine without trans fats. Vegetable oil. Low-fat, reduced-fat, or light mayonnaise and salad dressings (reduced-sodium). Canola, safflower, olive, soybean, and sunflower oils. Avocado. Seasoning and other foods Herbs. Spices. Seasoning mixes without salt. Unsalted popcorn and pretzels. Fat-free sweets. What foods are not recommended? The items listed may not be a complete list. Talk with your dietitian about what dietary choices are best for you. Grains Baked goods made with fat, such as croissants, muffins, or some breads. Dry pasta or rice meal packs. Vegetables Creamed or fried vegetables. Vegetables in a cheese sauce. Regular  canned vegetables (not low-sodium or reduced-sodium). Regular canned tomato sauce and paste (not low-sodium or reduced-sodium). Regular tomato and vegetable juice (not low-sodium or reduced-sodium). Rosita Fire. Olives. Fruits Canned fruit in a light or heavy syrup. Fried fruit. Fruit in cream or butter sauce. Meat and other protein foods Fatty cuts of meat. Ribs. Fried meat. Tomasa Blase. Sausage. Bologna and other processed lunch meats. Salami. Fatback. Hotdogs. Bratwurst. Salted nuts and seeds. Canned beans with added salt. Canned or smoked fish. Whole eggs or egg yolks. Chicken or [place] with skin. Dairy Whole or 2% milk, cream, and half-and-half. Whole or full-fat cream cheese. Whole-fat or sweetened yogurt. Full-fat cheese. Nondairy creamers. Whipped  toppings. Processed cheese and cheese spreads. Fats and oils Butter. Stick margarine. Lard. Shortening. Ghee. Bacon fat. Tropical oils, such as coconut, palm kernel, or palm oil. Seasoning and other foods Salted popcorn and pretzels. Onion salt, garlic salt, seasoned salt, table salt, and sea salt. Worcestershire sauce. Tartar sauce. Barbecue sauce. Teriyaki sauce. Soy sauce, including reduced-sodium. Steak sauce. Canned and packaged gravies. Fish sauce. Oyster sauce. Cocktail sauce. Horseradish that you find on the shelf. Ketchup. Mustard. Meat flavorings and tenderizers. Bouillon cubes. Hot sauce and Tabasco sauce. Premade or packaged marinades. Premade or packaged taco seasonings. Relishes. Regular salad dressings. Where to find more information:  National Heart, Lung, and Blood Institute: PopSteam.is  American Heart Association: www.heart.org Summary  The DASH eating plan is a healthy eating plan that has been shown to reduce high blood pressure (hypertension). It may also reduce your risk for type 2 diabetes, heart disease, and stroke.  With the DASH eating plan, you should limit salt (sodium) intake to 2,300 mg a day. If you have hypertension, you may need to reduce your sodium intake to 1,500 mg a day.  When on the DASH eating plan, aim to eat more fresh fruits and vegetables, whole grains, lean proteins, low-fat dairy, and heart-healthy fats.  Work with your health care provider or diet and nutrition specialist (dietitian) to adjust your eating plan to your individual calorie needs. This information is not intended to replace advice given to you by your health care provider. Make sure you discuss any questions you have with your health care provider. Document Released: 04/15/2011 Document Revised: 04/19/2016 Document Reviewed: 04/19/2016 Elsevier Interactive Patient Education  2017 Elsevier Inc.   Hydrochlorothiazide, HCTZ capsules or tablets What is this  medicine? HYDROCHLOROTHIAZIDE (hye droe klor oh THYE a zide) is a diuretic. It increases the amount of urine passed, which causes the body to lose salt and water. This medicine is used to treat high blood pressure. It is also reduces the swelling and water retention caused by various medical conditions, such as heart, liver, or kidney disease. This medicine may be used for other purposes; ask your health care provider or pharmacist if you have questions. COMMON BRAND NAME(S): Esidrix, Ezide, HydroDIURIL, Microzide, Oretic, Zide What should I tell my health care provider before I take this medicine? They need to know if you have any of these conditions: -diabetes -gout -immune system problems, like lupus -kidney disease or kidney stones -liver disease -pancreatitis -small amount of urine or difficulty passing urine -an unusual or allergic reaction to hydrochlorothiazide, sulfa drugs, other medicines, foods, dyes, or preservatives -pregnant or trying to get pregnant -breast-feeding How should I use this medicine? Take this medicine by mouth with a glass of water. Follow the directions on the prescription label. Take your medicine at regular intervals. Remember that you will need to pass urine  frequently after taking this medicine. Do not take your doses at a time of day that will cause you problems. Do not stop taking your medicine unless your doctor tells you to. Talk to your pediatrician regarding the use of this medicine in children. Special care may be needed. Overdosage: If you think you have taken too much of this medicine contact a poison control center or emergency room at once. NOTE: This medicine is only for you. Do not share this medicine with others. What if I miss a dose? If you miss a dose, take it as soon as you can. If it is almost time for your next dose, take only that dose. Do not take double or extra doses. What may interact with this  medicine? -cholestyramine -colestipol -digoxin -dofetilide -lithium -medicines for blood pressure -medicines for diabetes -medicines that relax muscles for surgery -other diuretics -steroid medicines like prednisone or cortisone This list may not describe all possible interactions. Give your health care provider a list of all the medicines, herbs, non-prescription drugs, or dietary supplements you use. Also tell them if you smoke, drink alcohol, or use illegal drugs. Some items may interact with your medicine. What should I watch for while using this medicine? Visit your doctor or health care professional for regular checks on your progress. Check your blood pressure as directed. Ask your doctor or health care professional what your blood pressure should be and when you should contact him or her. You may need to be on a special diet while taking this medicine. Ask your doctor. Check with your doctor or health care professional if you get an attack of severe diarrhea, nausea and vomiting, or if you sweat a lot. The loss of too much body fluid can make it dangerous for you to take this medicine. You may get drowsy or dizzy. Do not drive, use machinery, or do anything that needs mental alertness until you know how this medicine affects you. Do not stand or sit up quickly, especially if you are an older patient. This reduces the risk of dizzy or fainting spells. Alcohol may interfere with the effect of this medicine. Avoid alcoholic drinks. This medicine may affect your blood sugar level. If you have diabetes, check with your doctor or health care professional before changing the dose of your diabetic medicine. This medicine can make you more sensitive to the sun. Keep out of the sun. If you cannot avoid being in the sun, wear protective clothing and use sunscreen. Do not use sun lamps or tanning beds/booths. What side effects may I notice from receiving this medicine? Side effects that you should  report to your doctor or health care professional as soon as possible: -allergic reactions such as skin rash or itching, hives, swelling of the lips, mouth, tongue, or throat -changes in vision -chest pain -eye pain -fast or irregular heartbeat -feeling faint or lightheaded, falls -gout attack -muscle pain or cramps -pain or difficulty when passing urine -pain, tingling, numbness in the hands or feet -redness, blistering, peeling or loosening of the skin, including inside the mouth -unusually weak or tired Side effects that usually do not require medical attention (report to your doctor or health care professional if they continue or are bothersome): -change in sex drive or performance -dry mouth -headache -stomach upset This list may not describe all possible side effects. Call your doctor for medical advice about side effects. You may report side effects to FDA at 1-800-FDA-1088. Where should I keep my medicine? Keep  out of the reach of children. Store at room temperature between 15 and 30 degrees C (59 and 86 degrees F). Do not freeze. Protect from light and moisture. Keep container closed tightly. Throw away any unused medicine after the expiration date. NOTE: This sheet is a summary. It may not cover all possible information. If you have questions about this medicine, talk to your doctor, pharmacist, or health care provider.  2018 Elsevier/Gold Standard (2009-12-19 12:57:37)

## 2017-03-08 NOTE — Progress Notes (Signed)
Patient is here for high BP   Patient complains hand palm /finger stiff and swollen   Patient has tooth issues feet numbness   Patient stated that she is taking linzess 165 mg once a day

## 2017-03-09 LAB — RHEUMATOID FACTOR

## 2017-03-10 ENCOUNTER — Telehealth: Payer: Self-pay

## 2017-03-10 NOTE — Telephone Encounter (Signed)
[facility] call regarding lab results   Patient Verify DOB   Patient was aware and understood  

## 2017-03-10 NOTE — Telephone Encounter (Signed)
-----   Message from Lizbeth BarkMandesia R Hairston, FNP sent at 03/09/2017 12:19 PM EDT ----- -RF test which test for rheumatoid arthritis is negative

## 2017-03-22 ENCOUNTER — Ambulatory Visit: Payer: BLUE CROSS/BLUE SHIELD | Attending: Family Medicine | Admitting: *Deleted

## 2017-03-22 VITALS — BP 116/78

## 2017-03-22 DIAGNOSIS — Z0130 Encounter for examination of blood pressure without abnormal findings: Secondary | ICD-10-CM

## 2017-03-22 DIAGNOSIS — I10 Essential (primary) hypertension: Secondary | ICD-10-CM | POA: Insufficient documentation

## 2017-03-22 NOTE — Progress Notes (Signed)
[facility], alert and oriented and arrives in good spirits. Last OV on 03/08/2017 wit hPCP, BP was 130/81. Pt denies chest pain, SOB, HA, dizziness, or blurred vision. Reviewed medication with patient.  Pt states medication was taken prior to arrival today.  Manual blood pressure reading:116/78

## 2017-03-23 ENCOUNTER — Telehealth: Payer: Self-pay | Admitting: *Deleted

## 2017-03-23 NOTE — Addendum Note (Signed)
Addended by: Guy FrancoBENJAMIN, Demeisha Geraghty on: 03/23/2017 12:20 PM   Modules accepted: Level of Service

## 2017-03-23 NOTE — Telephone Encounter (Signed)
While in office for BP check, Pt has refill request for: ketoconazole (NIZORAL) 2 % cream

## 2017-03-24 ENCOUNTER — Other Ambulatory Visit: Payer: Self-pay | Admitting: Family Medicine

## 2017-03-24 DIAGNOSIS — Z760 Encounter for issue of repeat prescription: Secondary | ICD-10-CM

## 2017-03-24 MED ORDER — KETOCONAZOLE 2 % EX CREA: 1.0000 "application " | TOPICAL_CREAM | Freq: Every day | CUTANEOUS | 0 refills | Status: DC

## 2017-03-24 NOTE — Telephone Encounter (Signed)
Pt aware medication at pharmacy 

## 2017-03-24 NOTE — Telephone Encounter (Signed)
Refill sent to pharmacy on file

## 2017-06-13 ENCOUNTER — Ambulatory Visit: Payer: BLUE CROSS/BLUE SHIELD | Attending: Family Medicine | Admitting: Family Medicine

## 2017-06-13 ENCOUNTER — Encounter: Payer: Self-pay | Admitting: Family Medicine

## 2017-06-13 VITALS — BP 144/84 | HR 84 | Temp 97.8°F | Resp 16 | Ht 66.0 in | Wt 208.6 lb

## 2017-06-13 DIAGNOSIS — M791 Myalgia, unspecified site: Secondary | ICD-10-CM | POA: Diagnosis not present

## 2017-06-13 DIAGNOSIS — Z23 Encounter for immunization: Secondary | ICD-10-CM | POA: Diagnosis not present

## 2017-06-13 DIAGNOSIS — Z7982 Long term (current) use of aspirin: Secondary | ICD-10-CM | POA: Insufficient documentation

## 2017-06-13 DIAGNOSIS — Z9181 History of falling: Secondary | ICD-10-CM

## 2017-06-13 DIAGNOSIS — I10 Essential (primary) hypertension: Secondary | ICD-10-CM

## 2017-06-13 DIAGNOSIS — Z87828 Personal history of other (healed) physical injury and trauma: Secondary | ICD-10-CM | POA: Diagnosis not present

## 2017-06-13 DIAGNOSIS — M6289 Other specified disorders of muscle: Secondary | ICD-10-CM

## 2017-06-13 MED ORDER — METOPROLOL TARTRATE 25 MG PO TABS: 12.5000 mg | ORAL_TABLET | Freq: Two times a day (BID) | ORAL | 2 refills | Status: DC

## 2017-06-13 MED ORDER — METHOCARBAMOL 500 MG PO TABS: 500.0000 mg | ORAL_TABLET | Freq: Three times a day (TID) | ORAL | 0 refills | Status: DC | PRN

## 2017-06-13 NOTE — Progress Notes (Signed)
Subjective:  Patient ID: Kayla Preston, female    DOB: [birth year]  Age: 67 y.o. MRN: [id number]  CC: Follow-up   HPI Kayla Preston presents for follow up. PMH significant for hypertension. She reports 2 weeks ago visit her daughter, son-in-law, and newborn grandchild in [place]. She reports carrying many items tripped and fell down walking stairs. When she fell she hit her head and landed on her right side on concrete. She reported symptoms of unsteady gait for a few days after fall; myalgias, and  muscle stiffness. She denies any N/V, weakness of the extremities, or vision changes.   Outpatient Medications Prior to Visit  Medication Sig Dispense Refill  . aspirin EC 81 MG tablet Take 81 mg by mouth daily.    . Cyanocobalamin (VITAMIN B 12 PO) Take 1 tablet by mouth daily. Reported on 07/22/2015    . ibuprofen (ADVIL,MOTRIN) 600 MG tablet Take 1 tablet (600 mg total) by mouth every 8 (eight) hours as needed. (Patient not taking: Reported on 06/13/2017) 30 tablet 0  . ketoconazole (NIZORAL) 2 % cream Apply 1 application daily topically. (Patient not taking: Reported on 06/13/2017) 15 g 0  . hydrochlorothiazide (HYDRODIURIL) 12.5 MG tablet Take 1 tablet (12.5 mg total) by mouth daily. (Patient not taking: Reported on 06/13/2017) 30 tablet 2   No facility-administered medications prior to visit.     ROS Review of Systems  Constitutional: Negative.   HENT: Negative.   Eyes: Negative for visual disturbance.  Respiratory: Negative.   Cardiovascular: Negative.   Musculoskeletal: Positive for myalgias.  Skin: Negative.   Neurological: Negative for weakness and headaches.       History of fall with head injury    Objective:  BP (!) 144/84 (BP Location: Left Arm, Cuff Size: Normal)   Pulse 84   Temp 97.8 F (36.6 C) (Oral)   Resp 16   Ht 5\' 6"  (1.676 m)   Wt 208 lb 9.6 oz (94.6 kg)   SpO2 96%   BMI 33.67 kg/m   BP/Weight 06/13/2017 03/22/2017 03/08/2017  Systolic BP 144 116 130    Diastolic BP 84 78 81  Wt. (Lbs) 208.6 - 205.8  BMI 33.67 - 33.22     Physical Exam  Constitutional: She is oriented to person, place, and time.  HENT:  Head: Normocephalic and atraumatic.  Right Ear: External ear normal.  Left Ear: External ear normal.  Nose: Nose normal.  Mouth/Throat: Oropharynx is clear and moist.  Eyes: Conjunctivae and EOM are normal. Pupils are equal, round, and reactive to light.  Neck: No thyromegaly present.  Cardiovascular: Normal rate, regular rhythm, normal heart sounds and intact distal pulses.  Pulmonary/Chest: Effort normal and breath sounds normal.  Abdominal: Soft. Bowel sounds are normal. There is no tenderness.  Neurological: She is alert and oriented to person, place, and time. She has normal strength and normal reflexes. Coordination normal.  Skin: Skin is warm and dry.  Nursing note and vitals reviewed.   Assessment & Plan:   1. History of recent fall  - CT Head Wo Contrast; Future  2. History of head injury  - CT Head Wo Contrast; Future  3. Essential hypertension Schedule BP recheck in 2 weeks with clinical pharmacist. - metoprolol tartrate (LOPRESSOR) 25 MG tablet; Take 0.5 tablets (12.5 mg total) by mouth 2 (two) times daily.  Dispense: 30 tablet; Refill: 2  4. Myalgia  - methocarbamol (ROBAXIN) 500 MG tablet; Take 1 tablet (500 mg total) by mouth every 8 (eight)  hours as needed for muscle spasms.  Dispense: 30 tablet; Refill: 0  5. Muscle stiffness  - methocarbamol (ROBAXIN) 500 MG tablet; Take 1 tablet (500 mg total) by mouth every 8 (eight) hours as needed for muscle spasms.  Dispense: 30 tablet; Refill: 0  6. Immunization due  Go to [facility] pharmacy for vaccine     Follow-up: Return in about 2 weeks (around 06/27/2017), or if symptoms worsen or fail to improve, for BP check with Stacy.   Lizbeth Bark FNP

## 2017-06-13 NOTE — Patient Instructions (Addendum)
Managing Your Hypertension Hypertension is commonly called high blood pressure. This is when the force of your blood pressing against the walls of your arteries is too strong. Arteries are blood vessels that carry blood from your heart throughout your body. Hypertension forces the heart to work harder to pump blood, and may cause the arteries to become narrow or stiff. Having untreated or uncontrolled hypertension can cause heart attack, stroke, kidney disease, and other problems. What are blood pressure readings? A blood pressure reading consists of a higher number over a lower number. Ideally, your blood pressure should be below 120/80. The first ("top") number is called the systolic pressure. It is a measure of the pressure in your arteries as your heart beats. The second ("bottom") number is called the diastolic pressure. It is a measure of the pressure in your arteries as the heart relaxes. What does my blood pressure reading mean? Blood pressure is classified into four stages. Based on your blood pressure reading, your health care provider may use the following stages to determine what type of treatment you need, if any. Systolic pressure and diastolic pressure are measured in a unit called mm Hg. Normal  Systolic pressure: below 120.  Diastolic pressure: below 80. Elevated  Systolic pressure: 120-129.  Diastolic pressure: below 80. Hypertension stage 1  Systolic pressure: 130-139.  Diastolic pressure: 80-89. Hypertension stage 2  Systolic pressure: 140 or above.  Diastolic pressure: 90 or above. What health risks are associated with hypertension? Managing your hypertension is an important responsibility. Uncontrolled hypertension can lead to:  A heart attack.  A stroke.  A weakened blood vessel (aneurysm).  Heart failure.  Kidney damage.  Eye damage.  Metabolic syndrome.  Memory and concentration problems.  What changes can I make to manage my  hypertension? Hypertension can be managed by making lifestyle changes and possibly by taking medicines. Your health care provider will help you make a plan to bring your blood pressure within a normal range. Eating and drinking  Eat a diet that is high in fiber and potassium, and low in salt (sodium), added sugar, and fat. An example eating plan is called the DASH (Dietary Approaches to Stop Hypertension) diet. To eat this way: ? Eat plenty of fresh fruits and vegetables. Try to fill half of your plate at each meal with fruits and vegetables. ? Eat whole grains, such as whole wheat pasta, brown rice, or whole grain bread. Fill about one quarter of your plate with whole grains. ? Eat low-fat diary products. ? Avoid fatty cuts of meat, processed or cured meats, and poultry with skin. Fill about one quarter of your plate with lean proteins such as fish, chicken without skin, beans, eggs, and tofu. ? Avoid premade and processed foods. These tend to be higher in sodium, added sugar, and fat.  Reduce your daily sodium intake. Most people with hypertension should eat less than 1,500 mg of sodium a day.  Limit alcohol intake to no more than 1 drink a day for nonpregnant women and 2 drinks a day for men. One drink equals 12 oz of beer, 5 oz of wine, or 1 oz of hard liquor. Lifestyle  Work with your health care provider to maintain a healthy body weight, or to lose weight. Ask what an ideal weight is for you.  Get at least 30 minutes of exercise that causes your heart to beat faster (aerobic exercise) most days of the week. Activities may include walking, swimming, or biking.  Include exercise   to strengthen your muscles (resistance exercise), such as weight lifting, as part of your weekly exercise routine. Try to do these types of exercises for 30 minutes at least 3 days a week.  Do not use any products that contain nicotine or tobacco, such as cigarettes and e-cigarettes. If you need help quitting, ask  your health care provider.  Control any long-term (chronic) conditions you have, such as high cholesterol or diabetes. Monitoring  Monitor your blood pressure at home as told by your health care provider. Your personal target blood pressure may vary depending on your medical conditions, your age, and other factors.  Have your blood pressure checked regularly, as often as told by your health care provider. Working with your health care provider  Review all the medicines you take with your health care provider because there may be side effects or interactions.  Talk with your health care provider about your diet, exercise habits, and other lifestyle factors that may be contributing to hypertension.  Visit your health care provider regularly. Your health care provider can help you create and adjust your plan for managing hypertension. Will I need medicine to control my blood pressure? Your health care provider may prescribe medicine if lifestyle changes are not enough to get your blood pressure under control, and if:  Your systolic blood pressure is 130 or higher.  Your diastolic blood pressure is 80 or higher.  Take medicines only as told by your health care provider. Follow the directions carefully. Blood pressure medicines must be taken as prescribed. The medicine does not work as well when you skip doses. Skipping doses also puts you at risk for problems. Contact a health care provider if:  You think you are having a reaction to medicines you have taken.  You have repeated (recurrent) headaches.  You feel dizzy.  You have swelling in your ankles.  You have trouble with your vision. Get help right away if:  You develop a severe headache or confusion.  You have unusual weakness or numbness, or you feel faint.  You have severe pain in your chest or abdomen.  You vomit repeatedly.  You have trouble breathing. Summary  Hypertension is when the force of blood pumping through  your arteries is too strong. If this condition is not controlled, it may put you at risk for serious complications.  Your personal target blood pressure may vary depending on your medical conditions, your age, and other factors. For most people, a normal blood pressure is less than 120/80.  Hypertension is managed by lifestyle changes, medicines, or both. Lifestyle changes include weight loss, eating a healthy, low-sodium diet, exercising more, and limiting alcohol. This information is not intended to replace advice given to you by your health care provider. Make sure you discuss any questions you have with your health care provider. Document Released: 01/19/2012 Document Revised: 03/24/2016 Document Reviewed: 03/24/2016 Elsevier Interactive Patient Education  2018 [employer].  Metoprolol tablets What is this medicine? METOPROLOL (me TOE proe lole) is a beta-blocker. Beta-blockers reduce the workload on the heart and help it to beat more regularly. This medicine is used to treat high blood pressure and to prevent chest pain. It is also used to after a heart attack and to prevent an additional heart attack from occurring. This medicine may be used for other purposes; ask your health care provider or pharmacist if you have questions. COMMON BRAND NAME(S): Lopressor What should I tell my health care provider before I take this medicine? They  need to know if you have any of these conditions: -diabetes -heart or vessel disease like slow heart rate, worsening heart failure, heart block, sick sinus syndrome or Raynaud's disease -kidney disease -liver disease -lung or breathing disease, like asthma or emphysema -pheochromocytoma -thyroid disease -an unusual or allergic reaction to metoprolol, other beta-blockers, medicines, foods, dyes, or preservatives -pregnant or trying to get pregnant -breast-feeding How should I use this medicine? Take this medicine by mouth with a drink of water. Follow  the directions on the prescription label. Take this medicine immediately after meals. Take your doses at regular intervals. Do not take more medicine than directed. Do not stop taking this medicine suddenly. This could lead to serious heart-related effects. Talk to your pediatrician regarding the use of this medicine in children. Special care may be needed. Overdosage: If you think you have taken too much of this medicine contact a poison control center or emergency room at once. NOTE: This medicine is only for you. Do not share this medicine with others. What if I miss a dose? If you miss a dose, take it as soon as you can. If it is almost time for your next dose, take only that dose. Do not take double or extra doses. What may interact with this medicine? This medicine may interact with the following medications: -certain medicines for blood pressure, heart disease, irregular heart beat -certain medicines for depression like monoamine oxidase (MAO) inhibitors, fluoxetine, or paroxetine -clonidine -dobutamine -epinephrine -isoproterenol -reserpine This list may not describe all possible interactions. Give your health care provider a list of all the medicines, herbs, non-prescription drugs, or dietary supplements you use. Also tell them if you smoke, drink alcohol, or use illegal drugs. Some items may interact with your medicine. What should I watch for while using this medicine? Visit your doctor or health care professional for regular check ups. Contact your doctor right away if your symptoms worsen. Check your blood pressure and pulse rate regularly. Ask your health care professional what your blood pressure and pulse rate should be, and when you should contact them. You may get drowsy or dizzy. Do not drive, use machinery, or do anything that needs mental alertness until you know how this medicine affects you. Do not sit or stand up quickly, especially if you are an older patient. This reduces  the risk of dizzy or fainting spells. Contact your doctor if these symptoms continue. Alcohol may interfere with the effect of this medicine. Avoid alcoholic drinks. What side effects may I notice from receiving this medicine? Side effects that you should report to your doctor or health care professional as soon as possible: -allergic reactions like skin rash, itching or hives -cold or numb hands or feet -depression -difficulty breathing -faint -fever with sore throat -irregular heartbeat, chest pain -rapid weight gain -swollen legs or ankles Side effects that usually do not require medical attention (report to your doctor or health care professional if they continue or are bothersome): -anxiety or nervousness -change in sex drive or performance -dry skin -headache -nightmares or trouble sleeping -short term memory loss -stomach upset or diarrhea -unusually tired This list may not describe all possible side effects. Call your doctor for medical advice about side effects. You may report side effects to FDA at 1-800-FDA-1088. Where should I keep my medicine? Keep out of the reach of children. Store at room temperature between 15 and 30 degrees C (59 and 86 degrees F). Throw away any unused medicine after the expiration date.  NOTE: This sheet is a summary. It may not cover all possible information. If you have questions about this medicine, talk to your doctor, pharmacist, or health care provider.  2018 Elsevier/Gold Standard (2012-12-29 14:40:36)

## 2017-06-13 NOTE — Progress Notes (Signed)
poPatient is concern that she fell on her right side and bump her head on the concrete last Saturday. Patient stated no bleeding on her head.   Patient have health concerns she would like to talk to PCP.

## 2017-06-15 ENCOUNTER — Other Ambulatory Visit: Payer: Self-pay | Admitting: Pharmacist

## 2017-06-15 DIAGNOSIS — I10 Essential (primary) hypertension: Secondary | ICD-10-CM | POA: Insufficient documentation

## 2017-06-15 MED ORDER — INFLUENZA VAC SPLIT QUAD 0.5 ML IM SUSY: 0.5000 mL | PREFILLED_SYRINGE | Freq: Once | INTRAMUSCULAR | 0 refills | Status: AC

## 2017-06-15 MED FILL — FLUARIX QUADRIVALENT 0.5 ML: 0.5 | 1 days supply | Qty: 1 | Fill #0

## 2017-06-16 ENCOUNTER — Telehealth: Payer: Self-pay

## 2017-06-16 NOTE — Telephone Encounter (Signed)
Patient received message

## 2017-06-16 NOTE — Telephone Encounter (Signed)
Peer to peer review completed. Approval # [id number] good for 06/16/17-07/15/17. Patient will need approval number to take to outpatient registrar department at [facility] for [facility].

## 2017-06-16 NOTE — Telephone Encounter (Signed)
Patient have a CT head scan scheduled for tomorrow.  [employer] stated they need the physician to do a peer to peer review.   Please call [phone].

## 2017-06-16 NOTE — Telephone Encounter (Signed)
Cma attempt to call patient. No answer. Left a voicemail for patient to call back.

## 2017-06-17 ENCOUNTER — Ambulatory Visit: Payer: BLUE CROSS/BLUE SHIELD | Admitting: Family Medicine

## 2017-06-17 ENCOUNTER — Telehealth: Payer: Self-pay | Admitting: Family Medicine

## 2017-06-17 ENCOUNTER — Ambulatory Visit (HOSPITAL_COMMUNITY): Payer: BLUE CROSS/BLUE SHIELD | Attending: Family Medicine

## 2017-06-17 NOTE — Telephone Encounter (Signed)
Patient called and requested for ct scan referral to be with [facility] off of [address]. Please fu, [phone]

## 2017-06-17 NOTE — Telephone Encounter (Signed)
I don't do those anything imaging like ct,mri ,xray goes with the cma please, forwarder to the provider nurse  Thanks

## 2017-06-23 NOTE — Telephone Encounter (Signed)
Per pt.request please schedule CT with [facility] [address], [place], [place] [address]. [phone]

## 2017-06-27 NOTE — Telephone Encounter (Signed)
Patient is already scheduled for 07/07/2017 at 9pm at [facility].

## 2017-06-28 ENCOUNTER — Ambulatory Visit: Payer: BLUE CROSS/BLUE SHIELD | Attending: Family Medicine | Admitting: Pharmacist

## 2017-06-28 VITALS — BP 127/83 | HR 85

## 2017-06-28 DIAGNOSIS — I10 Essential (primary) hypertension: Secondary | ICD-10-CM | POA: Diagnosis not present

## 2017-06-28 NOTE — Progress Notes (Signed)
   S:    Patient arrives in good spirits.  Presents to the clinic for hypertension evaluation.    Reports that the metoprolol is giving her a headache  Patient reports adherence with medications. However, she didn't take it this morning due to a headache from yesterday. She has gone more than 24 hours now without medication.  Current BP Medications include:  Metoprolol tartrate 12.5 mg BID  Was on HCTZ in the past but wanted to stop that since it has been involved in recalls.   Trying to cut back on salt, has focused on using non-salt spices. Does drink coffee.   O:   Last 3 Office BP readings: BP Readings from Last 3 Encounters:  06/28/17 127/83  06/13/17 (!) 144/84  03/22/17 116/78    BMET    Component Value Date/Time   NA 140 05/17/2016 1004   K 4.3 05/17/2016 1004   CL 107 05/17/2016 1004   CO2 22 05/17/2016 1004   GLUCOSE 90 05/17/2016 1004   BUN 18 05/17/2016 1004   CREATININE 0.89 05/17/2016 1004   CALCIUM 9.6 05/17/2016 1004   GFRNONAA >60 11/12/2015 1311   GFRAA >60 11/12/2015 1311    A/P: Hypertension longstanding currently controlled off medication.  Discontinued metoprolol due to adverse effects and controlled blood pressure without it. Provided information about DASH diet and encouraged patient to continue regular physical activity (walking and gardening).    Results reviewed and written information provided.   Total time in face-to-face counseling 10 minutes.   F/U Clinic Visit with PCP as directed

## 2017-06-28 NOTE — Patient Instructions (Addendum)
Thanks for coming to see me!  Stop the metoprolol  Follow up with PCP as directed   DASH Eating Plan DASH stands for "Dietary Approaches to Stop Hypertension." The DASH eating plan is a healthy eating plan that has been shown to reduce high blood pressure (hypertension). It may also reduce your risk for type 2 diabetes, heart disease, and stroke. The DASH eating plan may also help with weight loss. What are tips for following this plan? General guidelines  Avoid eating more than 2,300 mg (milligrams) of salt (sodium) a day. If you have hypertension, you may need to reduce your sodium intake to 1,500 mg a day.  Limit alcohol intake to no more than 1 drink a day for nonpregnant women and 2 drinks a day for men. One drink equals 12 oz of beer, 5 oz of wine, or 1 oz of hard liquor.  Work with your health care provider to maintain a healthy body weight or to lose weight. Ask what an ideal weight is for you.  Get at least 30 minutes of exercise that causes your heart to beat faster (aerobic exercise) most days of the week. Activities may include walking, swimming, or biking.  Work with your health care provider or diet and nutrition specialist (dietitian) to adjust your eating plan to your individual calorie needs. Reading food labels  Check food labels for the amount of sodium per serving. Choose foods with less than 5 percent of the Daily Value of sodium. Generally, foods with less than 300 mg of sodium per serving fit into this eating plan.  To find whole grains, look for the word "whole" as the first word in the ingredient list. Shopping  Buy products labeled as "low-sodium" or "no salt added."  Buy fresh foods. Avoid canned foods and premade or frozen meals. Cooking  Avoid adding salt when cooking. Use salt-free seasonings or herbs instead of table salt or sea salt. Check with your health care provider or pharmacist before using salt substitutes.  Do not fry foods. Cook foods using  healthy methods such as baking, boiling, grilling, and broiling instead.  Cook with heart-healthy oils, such as olive, canola, soybean, or sunflower oil. Meal planning   Eat a balanced diet that includes: ? 5 or more servings of fruits and vegetables each day. At each meal, try to fill half of your plate with fruits and vegetables. ? Up to 6-8 servings of whole grains each day. ? Less than 6 oz of lean meat, poultry, or fish each day. A 3-oz serving of meat is about the same size as a deck of cards. One egg equals 1 oz. ? 2 servings of low-fat dairy each day. ? A serving of nuts, seeds, or beans 5 times each week. ? Heart-healthy fats. Healthy fats called Omega-3 fatty acids are found in foods such as flaxseeds and coldwater fish, like sardines, salmon, and mackerel.  Limit how much you eat of the following: ? Canned or prepackaged foods. ? Food that is high in trans fat, such as fried foods. ? Food that is high in saturated fat, such as fatty meat. ? Sweets, desserts, sugary drinks, and other foods with added sugar. ? Full-fat dairy products.  Do not salt foods before eating.  Try to eat at least 2 vegetarian meals each week.  Eat more home-cooked food and less restaurant, buffet, and fast food.  When eating at a restaurant, ask that your food be prepared with less salt or no salt, if possible.  What foods are recommended? The items listed may not be a complete list. Talk with your dietitian about what dietary choices are best for you. Grains Whole-grain or whole-wheat bread. Whole-grain or whole-wheat pasta. Brown rice. [place]row. Bulgur. Whole-grain and low-sodium cereals. Pita bread. Low-fat, low-sodium crackers. Whole-wheat flour tortillas. Vegetables Fresh or frozen vegetables (raw, steamed, roasted, or grilled). Low-sodium or reduced-sodium tomato and vegetable juice. Low-sodium or reduced-sodium tomato sauce and tomato paste. Low-sodium or reduced-sodium canned  vegetables. Fruits All fresh, dried, or frozen fruit. Canned fruit in natural juice (without added sugar). Meat and other protein foods Skinless chicken or [place]. Ground chicken or [place]. Pork with fat trimmed off. Fish and seafood. Egg whites. Dried beans, peas, or lentils. Unsalted nuts, nut butters, and seeds. Unsalted canned beans. Lean cuts of beef with fat trimmed off. Low-sodium, lean deli meat. Dairy Low-fat (1%) or fat-free (skim) milk. Fat-free, low-fat, or reduced-fat cheeses. Nonfat, low-sodium ricotta or cottage cheese. Low-fat or nonfat yogurt. Low-fat, low-sodium cheese. Fats and oils Soft margarine without trans fats. Vegetable oil. Low-fat, reduced-fat, or light mayonnaise and salad dressings (reduced-sodium). Canola, safflower, olive, soybean, and sunflower oils. Avocado. Seasoning and other foods Herbs. Spices. Seasoning mixes without salt. Unsalted popcorn and pretzels. Fat-free sweets. What foods are not recommended? The items listed may not be a complete list. Talk with your dietitian about what dietary choices are best for you. Grains Baked goods made with fat, such as croissants, muffins, or some breads. Dry pasta or rice meal packs. Vegetables Creamed or fried vegetables. Vegetables in a cheese sauce. Regular canned vegetables (not low-sodium or reduced-sodium). Regular canned tomato sauce and paste (not low-sodium or reduced-sodium). Regular tomato and vegetable juice (not low-sodium or reduced-sodium). Angie Fava. Olives. Fruits Canned fruit in a light or heavy syrup. Fried fruit. Fruit in cream or butter sauce. Meat and other protein foods Fatty cuts of meat. Ribs. Fried meat. Berniece Salines. Sausage. Bologna and other processed lunch meats. Salami. Fatback. Hotdogs. Bratwurst. Salted nuts and seeds. Canned beans with added salt. Canned or smoked fish. Whole eggs or egg yolks. Chicken or [place] with skin. Dairy Whole or 2% milk, cream, and half-and-half. Whole or full-fat  cream cheese. Whole-fat or sweetened yogurt. Full-fat cheese. Nondairy creamers. Whipped toppings. Processed cheese and cheese spreads. Fats and oils Butter. Stick margarine. Lard. Shortening. Ghee. Bacon fat. Tropical oils, such as coconut, palm kernel, or palm oil. Seasoning and other foods Salted popcorn and pretzels. Onion salt, garlic salt, seasoned salt, table salt, and sea salt. Worcestershire sauce. Tartar sauce. Barbecue sauce. Teriyaki sauce. Soy sauce, including reduced-sodium. Steak sauce. Canned and packaged gravies. Fish sauce. Oyster sauce. Cocktail sauce. Horseradish that you find on the shelf. Ketchup. Mustard. Meat flavorings and tenderizers. Bouillon cubes. Hot sauce and Tabasco sauce. Premade or packaged marinades. Premade or packaged taco seasonings. Relishes. Regular salad dressings. Where to find more information:  National Heart, Lung, and [place]: https://wilson-eaton.com/  American Heart Association: www.heart.org Summary  The DASH eating plan is a healthy eating plan that has been shown to reduce high blood pressure (hypertension). It may also reduce your risk for type 2 diabetes, heart disease, and stroke.  With the DASH eating plan, you should limit salt (sodium) intake to 2,300 mg a day. If you have hypertension, you may need to reduce your sodium intake to 1,500 mg a day.  When on the DASH eating plan, aim to eat more fresh fruits and vegetables, whole grains, lean proteins, low-fat dairy, and heart-healthy fats.  Work  with your health care provider or diet and nutrition specialist (dietitian) to adjust your eating plan to your individual calorie needs. This information is not intended to replace advice given to you by your health care provider. Make sure you discuss any questions you have with your health care provider. Document Released: 04/15/2011 Document Revised: 04/19/2016 Document Reviewed: 04/19/2016 Elsevier Interactive Patient Education  [employer].

## 2017-07-02 ENCOUNTER — Encounter (HOSPITAL_COMMUNITY): Payer: Self-pay | Admitting: Emergency Medicine

## 2017-07-02 ENCOUNTER — Other Ambulatory Visit: Payer: Self-pay

## 2017-07-02 ENCOUNTER — Emergency Department (HOSPITAL_COMMUNITY)
Admission: EM | Admit: 2017-07-02 | Discharge: 2017-07-02 | Disposition: A | Discharge status: Home / Self Care | Payer: BLUE CROSS/BLUE SHIELD | Attending: Emergency Medicine | Admitting: Emergency Medicine

## 2017-07-02 DIAGNOSIS — Z79899 Other long term (current) drug therapy: Secondary | ICD-10-CM | POA: Insufficient documentation

## 2017-07-02 DIAGNOSIS — Z7982 Long term (current) use of aspirin: Secondary | ICD-10-CM | POA: Insufficient documentation

## 2017-07-02 DIAGNOSIS — I10 Essential (primary) hypertension: Secondary | ICD-10-CM | POA: Diagnosis not present

## 2017-07-02 DIAGNOSIS — R112 Nausea with vomiting, unspecified: Secondary | ICD-10-CM

## 2017-07-02 LAB — COMPREHENSIVE METABOLIC PANEL
ALK PHOS: 73 U/L (ref 38–126)
ALT: 27 U/L (ref 14–54)
ANION GAP: 10 (ref 5–15)
AST: 32 U/L (ref 15–41)
Albumin: 4.1 g/dL (ref 3.5–5.0)
BUN: 11 mg/dL (ref 6–20)
CALCIUM: 9.4 mg/dL (ref 8.9–10.3)
CO2: 23 mmol/L (ref 22–32)
CREATININE: 0.8 mg/dL (ref 0.44–1.00)
Chloride: 106 mmol/L (ref 101–111)
Glucose, Bld: 110 mg/dL — ABNORMAL HIGH (ref 65–99)
Potassium: 4.1 mmol/L (ref 3.5–5.1)
Sodium: 139 mmol/L (ref 135–145)
TOTAL PROTEIN: 7.5 g/dL (ref 6.5–8.1)
Total Bilirubin: 0.5 mg/dL (ref 0.3–1.2)

## 2017-07-02 LAB — CBC
HCT: 43 % (ref 36.0–46.0)
Hemoglobin: 14.2 g/dL (ref 12.0–15.0)
MCH: 24.4 pg — ABNORMAL LOW (ref 26.0–34.0)
MCHC: 33 g/dL (ref 30.0–36.0)
MCV: 73.8 fL — ABNORMAL LOW (ref 78.0–100.0)
PLATELETS: 221 10*3/uL (ref 150–400)
RBC: 5.83 MIL/uL — AB (ref 3.87–5.11)
RDW: 15.7 % — ABNORMAL HIGH (ref 11.5–15.5)
WBC: 5.5 10*3/uL (ref 4.0–10.5)

## 2017-07-02 LAB — URINALYSIS, ROUTINE W REFLEX MICROSCOPIC
BILIRUBIN URINE: NEGATIVE
Glucose, UA: NEGATIVE mg/dL
Hgb urine dipstick: NEGATIVE
Ketones, ur: NEGATIVE mg/dL
NITRITE: POSITIVE — AB
Protein, ur: NEGATIVE mg/dL
Specific Gravity, Urine: 1.02 (ref 1.005–1.030)
pH: 5 (ref 5.0–8.0)

## 2017-07-02 LAB — LIPASE, BLOOD: Lipase: 28 U/L (ref 11–51)

## 2017-07-02 MED ORDER — ONDANSETRON 4 MG PO TBDP
4.0000 mg | ORAL_TABLET | Freq: Once | ORAL | Status: AC
Start: 2017-07-02 — End: 2017-07-02
  Administered 2017-07-02: 4 mg via ORAL
  Filled 2017-07-02: qty 1

## 2017-07-02 MED ORDER — ONDANSETRON 4 MG PO TBDP: 4.0000 mg | ORAL_TABLET | Freq: Three times a day (TID) | ORAL | 0 refills | Status: DC | PRN

## 2017-07-02 NOTE — ED Notes (Signed)
Pt given ginger ale per MD 

## 2017-07-02 NOTE — ED Provider Notes (Addendum)
[facility] EMERGENCY DEPARTMENT Provider Note   CSN: [id number] Arrival date & time: 07/02/17  0906     History   Chief Complaint Chief Complaint  Patient presents with  . abdominal discomfort/vomiting  . Abdominal Pain    HPI Kayla Preston is a 67 y.o. female with history of resolved atrial fibrillation after ablation, hypertension who presents with nausea, vomiting, and abdominal discomfort that woke her up this morning around 5 AM.  Patient reports eating fish last night around 6 PM.  She reports the fish was questionable and had a weird taste.  She had 3 episodes of nausea and vomiting.  She woke up feeling a little lightheaded as well.  She denies any abdominal pain, fevers, chest pain, shortness of breath, or urinary symptoms.  She is feeling well yesterday.  She denies any recent travel.  She also denies any recent antibiotic treatment.  She is feeling better now that she has been in the emergency department.  She reports after the first episode of vomiting, she sipped on some water and ginger.  Otherwise no medication taken prior to arrival.  HPI  Past Medical History:  Diagnosis Date  . Atrial fibrillation (HCC)   . Atrial fibrillation (HCC)    she had ablation which resolved intermittent afib  . Obesity   . Seasonal allergies     Patient Active Problem List   Diagnosis Date Noted  . Essential hypertension 06/15/2017  . Cervical polyp 06/16/2016  . Anemia, iron deficiency 07/22/2015  . Awareness of heartbeats 07/05/2011  . Bundle branch block, right 07/05/2011  . Supraventricular tachycardia (HCC) 07/05/2011    Past Surgical History:  Procedure Laterality Date  . ABLATION      OB History    No data available       Home Medications    Prior to Admission medications   Medication Sig Start Date End Date Taking? Authorizing Provider  aspirin EC 81 MG tablet Take 81 mg by mouth daily.    [provider]  Cyanocobalamin  (VITAMIN B 12 PO) Take 1 tablet by mouth daily. Reported on 07/22/2015    [provider]  methocarbamol (ROBAXIN) 500 MG tablet Take 1 tablet (500 mg total) by mouth every 8 (eight) hours as needed for muscle spasms. 06/13/17   Lizbeth Bark, FNP  ondansetron (ZOFRAN ODT) 4 MG disintegrating tablet Take 1 tablet (4 mg total) by mouth every 8 (eight) hours as needed for nausea or vomiting. 07/02/17   Emi Holes, PA-C    Family History No family history on file.  Social History Social History   Tobacco Use  . Smoking status: Never Smoker  . Smokeless tobacco: Never Used  Substance Use Topics  . Alcohol use: No  . Drug use: No     Allergies   Patient has no known allergies.   Review of Systems Review of Systems  Constitutional: Negative for chills and fever.  HENT: Negative for facial swelling and sore throat.   Respiratory: Negative for shortness of breath.   Cardiovascular: Negative for chest pain.  Gastrointestinal: Positive for nausea and vomiting. Negative for abdominal pain and diarrhea.  Genitourinary: Negative for dysuria.  Musculoskeletal: Negative for back pain.  Skin: Negative for rash and wound.  Neurological: Positive for light-headedness. Negative for headaches.  Psychiatric/Behavioral: The patient is not nervous/anxious.      Physical Exam Updated Vital Signs BP 124/75   Pulse 79   Temp 97.6 F (36.4 C) (Oral)  Resp 15   Ht 5\' 6"  (1.676 m)   Wt 93.9 kg (207 lb)   SpO2 100%   BMI 33.41 kg/m   Physical Exam  Constitutional: She appears well-developed and well-nourished. No distress.  HENT:  Head: Normocephalic and atraumatic.  Mouth/Throat: Oropharynx is clear and moist. No oropharyngeal exudate.  Eyes: Conjunctivae are normal. Pupils are equal, round, and reactive to light. Right eye exhibits no discharge. Left eye exhibits no discharge. No scleral icterus.  Neck: Normal range of motion. Neck supple. No thyromegaly present.    Cardiovascular: Normal rate, regular rhythm, normal heart sounds and intact distal pulses. Exam reveals no gallop and no friction rub.  No murmur heard. Pulmonary/Chest: Effort normal and breath sounds normal. No stridor. No respiratory distress. She has no wheezes. She has no rales.  Abdominal: Soft. Bowel sounds are normal. She exhibits no distension. There is no tenderness. There is no rebound and no guarding.  Musculoskeletal: She exhibits no edema.  Lymphadenopathy:    She has no cervical adenopathy.  Neurological: She is alert. Coordination normal.  Skin: Skin is warm and dry. No rash noted. She is not diaphoretic. No pallor.  Psychiatric: She has a normal mood and affect.  Nursing note and vitals reviewed.    ED Treatments / Results  Labs (all labs ordered are listed, but only abnormal results are displayed) Labs Reviewed  COMPREHENSIVE METABOLIC PANEL - Abnormal; Notable for the following components:      Result Value   Glucose, Bld 110 (*)    All other components within normal limits  CBC - Abnormal; Notable for the following components:   RBC 5.83 (*)    MCV 73.8 (*)    MCH 24.4 (*)    RDW 15.7 (*)    All other components within normal limits  URINALYSIS, ROUTINE W REFLEX MICROSCOPIC - Abnormal; Notable for the following components:   APPearance HAZY (*)    Nitrite POSITIVE (*)    Leukocytes, UA TRACE (*)    Bacteria, UA MANY (*)    Squamous Epithelial / LPF 6-30 (*)    All other components within normal limits  URINE CULTURE  LIPASE, BLOOD    EKG  EKG Interpretation  Date/Time:  Saturday July 02 2017 14:25:01 EST Ventricular Rate:  78 PR Interval:    QRS Duration: 152 QT Interval:  410 QTC Calculation: 467 R Axis:   42 Text Interpretation:  Sinus rhythm Right bundle branch block Artifact in lead(s) I II aVR aVL aVF since last tracing no significant change Confirmed by Eber Hong ([id number]) on 07/02/2017 2:29:45 PM       Radiology No results  found.  Procedures Procedures (including critical care time)  Medications Ordered in ED Medications  ondansetron (ZOFRAN-ODT) disintegrating tablet 4 mg (4 mg Oral Given 07/02/17 1227)     Initial Impression / Assessment and Plan / ED Course  I have reviewed the triage vital signs and the nursing notes.  Pertinent labs & imaging results that were available during my care of the patient were reviewed by me and considered in my medical decision making (see chart for details).  Clinical Course as of Jul 02 1428  Sat Jul 02, 2017  1225 Abdominal exam is nontender.  Patient is well-appearing.  I offered IV fluids, considering lightheadedness, however patient would just like to try PO fluids.  I feel this is reasonable.  Will order Zofran ODT and fluid challenge.  [AL]    Clinical Course User Index [AL]  Emi HolesLaw, Kamrynn Melott M, PA-C     CHA2DS2-VASc Score 2, however patient atrial fibrillation has resolved after ablation.  Patient with suspected gastroenteritis, possibly food related.  Patient also with possible UTI, although contaminated sample.  Considering nitrite positive, will initiate treatment with Keflex (which was called into the pharmacy).  Urine culture sent.  Labs are otherwise unremarkable.  Considering lightheadedness and patient's history of A. fib and SVT, EKG obtained.  EKG shows NSR with RBBB, unchanged from past. Patient's abdominal exam is benign, nontender.  She is feeling much better.  Will discharge home with Zofran in case of return of symptoms.  Strict return precautions given including fever, localizing abdominal pain, intractable vomiting.  Follow-up to PCP as needed.  Patient understands and agrees with plan.  Patient vitals stable throughout ED course and discharged in satisfactory condition. I discussed patient case with Dr. Hyacinth MeekerMiller who agrees with plan.   Final Clinical Impressions(s) / ED Diagnoses   Final diagnoses:  Non-intractable vomiting with nausea, unspecified  vomiting type    ED Discharge Orders        Ordered    ondansetron (ZOFRAN ODT) 4 MG disintegrating tablet  Every 8 hours PRN     07/02/17 [address]1425           Sherica Paternostro, PaysonAlexandra M, PA-C 07/02/17 1628    Eber HongMiller, Brian, MD 07/03/17 [phone]

## 2017-07-02 NOTE — ED Triage Notes (Signed)
Pt. Stated, I ate fish last night and this morning and I threw up this morning and I just feel sick. Ive drank some water and water .

## 2017-07-02 NOTE — Discharge Instructions (Signed)
Take Zofran every 8 hours as needed for nausea or vomiting.  Start with clear fluids for the first 12-24 hours.  Progress to bland foods such as bananas, rice, applesauce, toast.  Avoid greasy, fatty, fried foods until you are feeling back to normal.  Make sure to stay well-hydrated.  Please return to the emergency department if you develop any new or worsening symptoms including fever over 100.4, localizing abdominal pain, intractable vomiting, or any other new or concerning symptoms.  Please follow-up with your doctor if your symptoms persist.

## 2017-07-04 LAB — URINE CULTURE
Culture: >=100000 — AB
Special Requests: NORMAL

## 2017-07-05 ENCOUNTER — Telehealth: Payer: Self-pay | Admitting: *Deleted

## 2017-07-05 NOTE — Telephone Encounter (Signed)
Post ED Visit - Positive Culture Follow-up  Culture report reviewed by antimicrobial stewardship pharmacist:  []  Enzo BiNathan Batchelder, Pharm.D. []  Celedonio MiyamotoJeremy Frens, Pharm.D., BCPS AQ-ID [x]  Garvin FilaMike Maccia, Pharm.D., BCPS []  Georgina PillionElizabeth Martin, Pharm.D., BCPS []  Guthrie CenterMinh Pham, [address] BoulevardPharm.D., BCPS, AAHIVP []  Estella HuskMichelle Turner, Pharm.D., BCPS, AAHIVP []  Lysle Pearlachel Rumbarger, PharmD, BCPS []  Blake DivineShannon Parkey, PharmD []  Pollyann SamplesAndy Johnston, PharmD, BCPS  Positive urine culture Treated with Keflex, organism sensitive to the same and no further patient follow-up is required at this time.  Virl AxeRobertson, Rhea Thrun [facility] 07/05/2017, 9:38 AM

## 2017-07-06 ENCOUNTER — Telehealth: Payer: Self-pay | Admitting: Family Medicine

## 2017-07-06 NOTE — Telephone Encounter (Signed)
[facility] called requesting orders for CT head without contrast and a MRA without contrast. [employer] reschedule this appointment there. Please fax orders to [phone] [facility]

## 2017-07-07 ENCOUNTER — Telehealth: Payer: Self-pay | Admitting: Internal Medicine

## 2017-07-07 ENCOUNTER — Telehealth: Payer: Self-pay | Admitting: Family Medicine

## 2017-07-07 NOTE — Telephone Encounter (Signed)
Faxed paperwork to given fax number. Pt aware and [facility]

## 2017-07-07 NOTE — Telephone Encounter (Signed)
[facility] to say [facility] this pt is in their office and needs orders for a CT head without contrast and a MRA without contrast. Please fax orders to [phone]

## 2017-07-07 NOTE — Telephone Encounter (Signed)
[facility] called to verify that order was just for CT Head w/o and not MRA in addition to imaging today. Only documentation in OV note from NP on 06/13/2017 was for CT head w/o r/t  Fall. Did not see an order for MRA in imaging order.

## 2017-07-07 NOTE — Telephone Encounter (Signed)
Imagining results, 2 page paperwork received through fax 07/07/17.

## 2017-07-07 NOTE — Telephone Encounter (Signed)
Carollee HerterShannon number [phone] option [phone]

## 2017-07-15 NOTE — Telephone Encounter (Signed)
Patient verified DOB Patient is aware of CT being normal. No further questions at this time.

## 2017-08-03 ENCOUNTER — Ambulatory Visit: Payer: BLUE CROSS/BLUE SHIELD | Admitting: Family Medicine

## 2017-09-06 ENCOUNTER — Other Ambulatory Visit: Payer: Self-pay

## 2017-09-06 MED ORDER — METOPROLOL TARTRATE 25 MG PO TABS: 12.5000 mg | ORAL_TABLET | Freq: Two times a day (BID) | ORAL | 3 refills | Status: DC

## 2017-09-07 ENCOUNTER — Ambulatory Visit: Payer: BLUE CROSS/BLUE SHIELD | Admitting: Family Medicine

## 2017-09-13 ENCOUNTER — Encounter: Payer: Self-pay | Admitting: Family Medicine

## 2017-09-13 ENCOUNTER — Ambulatory Visit: Payer: BLUE CROSS/BLUE SHIELD | Attending: Family Medicine | Admitting: Family Medicine

## 2017-09-13 VITALS — BP 139/81 | HR 81 | Temp 97.5°F | Ht 66.0 in | Wt 207.4 lb

## 2017-09-13 DIAGNOSIS — I4891 Unspecified atrial fibrillation: Secondary | ICD-10-CM | POA: Diagnosis not present

## 2017-09-13 DIAGNOSIS — Z9889 Other specified postprocedural states: Secondary | ICD-10-CM | POA: Insufficient documentation

## 2017-09-13 DIAGNOSIS — I471 Supraventricular tachycardia: Secondary | ICD-10-CM | POA: Insufficient documentation

## 2017-09-13 DIAGNOSIS — R51 Headache: Secondary | ICD-10-CM | POA: Diagnosis not present

## 2017-09-13 DIAGNOSIS — Z79899 Other long term (current) drug therapy: Secondary | ICD-10-CM | POA: Diagnosis not present

## 2017-09-13 DIAGNOSIS — I10 Essential (primary) hypertension: Secondary | ICD-10-CM

## 2017-09-13 DIAGNOSIS — R490 Dysphonia: Secondary | ICD-10-CM | POA: Insufficient documentation

## 2017-09-13 DIAGNOSIS — Z7982 Long term (current) use of aspirin: Secondary | ICD-10-CM | POA: Insufficient documentation

## 2017-09-13 DIAGNOSIS — J302 Other seasonal allergic rhinitis: Secondary | ICD-10-CM

## 2017-09-13 DIAGNOSIS — E669 Obesity, unspecified: Secondary | ICD-10-CM | POA: Insufficient documentation

## 2017-09-13 MED ORDER — LISINOPRIL-HYDROCHLOROTHIAZIDE 10-12.5 MG PO TABS: 1.0000 | ORAL_TABLET | Freq: Every day | ORAL | 6 refills | Status: DC

## 2017-09-13 MED ORDER — CETIRIZINE HCL 10 MG PO TABS
10.0000 mg | ORAL_TABLET | Freq: Every day | ORAL | 1 refills | Status: DC
Start: 2017-09-13 — End: 2018-03-13

## 2017-09-13 NOTE — Progress Notes (Signed)
Subjective:  Patient ID: Kayla Preston, female    DOB: 1950-06-13  Age: 67 y.o. MRN: [id number]  CC: Hypertension and Establish Care   HPI Kayla Preston is a 67 year old female with a history of hypertension, previous history of SVT who presents today to establish care with me. She was previously on metoprolol which she discontinued due to complaints of headaches which she noticed resolved after she discontinued it.  Prior to metoprolol she was on losartan which was changed due to her concerns about recalls. Today she complains of a hoarse voice, coughing which is mostly at night with sometimes production of yellowish sputum.  She denies sinus tenderness, fever, myalgias.  Past Medical History:  Diagnosis Date  . Atrial fibrillation (HCC)   . Atrial fibrillation (HCC)    she had ablation which resolved intermittent afib  . Obesity   . Seasonal allergies     Past Surgical History:  Procedure Laterality Date  . ABLATION      No Known Allergies   Outpatient Medications Prior to Visit  Medication Sig Dispense Refill  . aspirin EC 81 MG tablet Take 81 mg by mouth daily.    . metoprolol tartrate (LOPRESSOR) 25 MG tablet Take 0.5 tablets (12.5 mg total) by mouth 2 (two) times daily. 30 tablet 3  . Cyanocobalamin (VITAMIN B 12 PO) Take 1 tablet by mouth daily. Reported on 07/22/2015    . methocarbamol (ROBAXIN) 500 MG tablet Take 1 tablet (500 mg total) by mouth every 8 (eight) hours as needed for muscle spasms. (Patient not taking: Reported on 09/13/2017) 30 tablet 0  . ondansetron (ZOFRAN ODT) 4 MG disintegrating tablet Take 1 tablet (4 mg total) by mouth every 8 (eight) hours as needed for nausea or vomiting. (Patient not taking: Reported on 09/13/2017) 12 tablet 0   No facility-administered medications prior to visit.     ROS Review of Systems  Constitutional: Negative for activity change, appetite change and fatigue.  HENT: Positive for sore throat. Negative for congestion  and sinus pressure.   Eyes: Negative for visual disturbance.  Respiratory: Positive for cough. Negative for chest tightness, shortness of breath and wheezing.   Cardiovascular: Negative for chest pain and palpitations.  Gastrointestinal: Negative for abdominal distention, abdominal pain and constipation.  Endocrine: Negative for polydipsia.  Genitourinary: Negative for dysuria and frequency.  Musculoskeletal: Negative for arthralgias and back pain.  Skin: Negative for rash.  Neurological: Negative for tremors, light-headedness and numbness.  Hematological: Does not bruise/bleed easily.  Psychiatric/Behavioral: Negative for agitation and behavioral problems.    Objective:  BP 139/81   Pulse 81   Temp (!) 97.5 F (36.4 C) (Oral)   Ht  (1.676 m)   Wt 207 lb 6.4 oz (94.1 kg)   SpO2 98%   BMI 33.48 kg/m   BP/Weight 09/13/2017 07/02/2017 06/28/2017  Systolic BP 139 124 127  Diastolic BP 81 86 83  Wt. (Lbs) 207.4 207 -  BMI 33.48 33.41 -      Physical Exam  Constitutional: She is oriented to person, place, and time. She appears well-developed and well-nourished.  Cardiovascular: Normal rate, normal heart sounds and intact distal pulses.  No murmur heard. Pulmonary/Chest: Effort normal and breath sounds normal. She has no wheezes. She has no rales. She exhibits no tenderness.  Abdominal: Soft. Bowel sounds are normal. She exhibits no distension and no mass. There is no tenderness.  Musculoskeletal: Normal range of motion.  Neurological: She is alert and oriented to person, place,  and time.  Skin: Skin is warm and dry.  Psychiatric: She has a normal mood and affect.     Assessment & Plan:   1. Essential hypertension Controlled Switched from metoprolol to lisinopril/HCTZ due to complaints of headaches with the former - lisinopril-hydrochlorothiazide (PRINZIDE,ZESTORETIC) 10-12.5 MG tablet; Take 1 tablet by mouth daily.  Dispense: 30 tablet; Refill: 6  2. Seasonal  allergies Symptoms described above could be secondary to postnasal drip and seasonal allergies - cetirizine (ZYRTEC) 10 MG tablet; Take 1 tablet (10 mg total) by mouth daily.  Dispense: 30 tablet; Refill: 1   Meds ordered this encounter  Medications  . lisinopril-hydrochlorothiazide (PRINZIDE,ZESTORETIC) 10-12.5 MG tablet    Sig: Take 1 tablet by mouth daily.    Dispense:  30 tablet    Refill:  6    Discontinue metoprolol  . cetirizine (ZYRTEC) 10 MG tablet    Sig: Take 1 tablet (10 mg total) by mouth daily.    Dispense:  30 tablet    Refill:  1    Follow-up: Return in about 3 months (around 12/14/2017) for follow up of chronic medical conditions.   Hoy Register MD

## 2017-09-13 NOTE — Patient Instructions (Signed)

## 2017-09-20 ENCOUNTER — Telehealth: Payer: Self-pay

## 2017-09-20 NOTE — Telephone Encounter (Signed)
Will route to pcp 

## 2017-09-20 NOTE — Telephone Encounter (Signed)
JA 

## 2017-09-21 ENCOUNTER — Telehealth: Payer: Self-pay

## 2017-09-21 MED ORDER — BENZONATATE 100 MG PO CAPS: 100.0000 mg | ORAL_CAPSULE | Freq: Two times a day (BID) | ORAL | 0 refills | Status: DC | PRN

## 2017-09-21 NOTE — Telephone Encounter (Signed)
Patient was called to be informed of Tessolon pearls being sent to her pharmacy ut voicemail is not set up to leave a message

## 2017-09-21 NOTE — Telephone Encounter (Signed)
Kayla Preston has been sent to her pharmacy

## 2017-09-21 NOTE — Addendum Note (Signed)
Addended byHoy Register on: 09/21/2017 01:40 PM   Modules accepted: Orders

## 2017-10-19 ENCOUNTER — Ambulatory Visit: Payer: BLUE CROSS/BLUE SHIELD | Attending: Family Medicine | Admitting: Physician Assistant

## 2017-10-19 VITALS — BP 108/68 | HR 85 | Temp 98.2°F | Resp 18 | Ht 66.0 in | Wt 205.8 lb

## 2017-10-19 DIAGNOSIS — R059 Cough, unspecified: Secondary | ICD-10-CM

## 2017-10-19 DIAGNOSIS — Z6833 Body mass index (BMI) 33.0-33.9, adult: Secondary | ICD-10-CM | POA: Diagnosis not present

## 2017-10-19 DIAGNOSIS — Z79899 Other long term (current) drug therapy: Secondary | ICD-10-CM | POA: Insufficient documentation

## 2017-10-19 DIAGNOSIS — Z7982 Long term (current) use of aspirin: Secondary | ICD-10-CM | POA: Insufficient documentation

## 2017-10-19 DIAGNOSIS — E669 Obesity, unspecified: Secondary | ICD-10-CM | POA: Diagnosis not present

## 2017-10-19 DIAGNOSIS — R52 Pain, unspecified: Secondary | ICD-10-CM | POA: Diagnosis not present

## 2017-10-19 DIAGNOSIS — R05 Cough: Secondary | ICD-10-CM

## 2017-10-19 MED ORDER — AZITHROMYCIN 250 MG PO TABS: ORAL_TABLET | ORAL | 0 refills | Status: DC

## 2017-10-19 MED ORDER — OMEPRAZOLE 20 MG PO CPDR: 20.0000 mg | DELAYED_RELEASE_CAPSULE | Freq: Every day | ORAL | 3 refills | Status: DC

## 2017-10-19 NOTE — Progress Notes (Signed)
Patient ID: Kayla Preston, female   DOB: Sep 09, 1950, 67 y.o.   MRN: [id number]   Breonia Kirstein, is a 67 y.o. female  [id number]  [id number]  DOB - [birth year]  Subjective:  Chief Complaint and HPI: Kayla Preston is a 67 y.o. female here today with abouta 6 week h/o cough.  Was treated in May for allergies and did not improve.  Sometimes coughing up yellow phlegm.  No f/c.  Cough worse at night.    Some muscle aches after working in the garden.  Works in her garden about 5 hours/day.  Only drinks about 8 ounces of water daily.     ROS:   Constitutional:  No f/c, No night sweats, No unexplained weight loss. EENT:  No vision changes, No blurry vision, No hearing changes. No mouth, throat, or ear problems.  Respiratory: + cough, No SOB Cardiac: No CP, no palpitations GI:  No abd pain, No N/V/D. GU: No Urinary s/sx Musculoskeletal: No joint pain Neuro: No headache, no dizziness, no motor weakness.  Skin: No rash Endocrine:  No polydipsia. No polyuria.  Psych: Denies SI/HI  No problems updated.  ALLERGIES: No Known Allergies  PAST MEDICAL HISTORY: Past Medical History:  Diagnosis Date  . Atrial fibrillation (HCC)   . Atrial fibrillation (HCC)    she had ablation which resolved intermittent afib  . Obesity   . Seasonal allergies     MEDICATIONS AT HOME: Prior to Admission medications   Medication Sig Start Date End Date Taking? Authorizing Provider  aspirin EC 81 MG tablet Take 81 mg by mouth daily.   Yes [provider]  benzonatate (TESSALON) 100 MG capsule Take 1 capsule (100 mg total) by mouth 2 (two) times daily as needed for cough. 09/21/17  Yes Hoy Register, MD  cetirizine (ZYRTEC) 10 MG tablet Take 1 tablet (10 mg total) by mouth daily. 09/13/17  Yes Hoy Register, MD  Cyanocobalamin (VITAMIN B 12 PO) Take 1 tablet by mouth daily. Reported on 07/22/2015   Yes [provider]  lisinopril-hydrochlorothiazide (PRINZIDE,ZESTORETIC)  10-12.5 MG tablet Take 1 tablet by mouth daily. 09/13/17  Yes Hoy Register, MD  azithromycin (ZITHROMAX) 250 MG tablet Take 2 today then 1 daily 10/19/17   Anders Simmonds, PA-C  methocarbamol (ROBAXIN) 500 MG tablet Take 1 tablet (500 mg total) by mouth every 8 (eight) hours as needed for muscle spasms. Patient not taking: Reported on 09/13/2017 06/13/17   Lizbeth Bark, FNP  omeprazole (PRILOSEC) 20 MG capsule Take 1 capsule (20 mg total) by mouth daily. 10/19/17   Anders Simmonds, PA-C     Objective:  EXAM:   Vitals:   10/19/17 0943  BP: 108/68  Pulse: 85  Resp: 18  Temp: 98.2 F (36.8 C)  TempSrc: Oral  SpO2: 98%  Weight: 205 lb 12.8 oz (93.4 kg)  Height: 5\' 6"  (1.676 m)    General appearance : A&OX3. NAD. Non-toxic-appearing HEENT: Atraumatic and Normocephalic.  PERRLA. EOM intact.  TM clear B. Mouth-MMM, post pharynx WNL w/o erythema, No PND. Neck: supple, no JVD. No cervical lymphadenopathy. No thyromegaly Chest/Lungs:  Breathing-non-labored, Good air entry bilaterally, breath sounds normal without rales, rhonchi, or wheezing  CVS: S1 S2 regular, no murmurs, gallops, rubs  Extremities: Bilateral Lower Ext shows no edema, both legs are warm to touch with = pulse throughout Neurology:  CN II-XII grossly intact, Non focal.   Psych:  TP linear. J/I WNL. Normal speech. Appropriate eye contact and affect.  Skin:  No  Rash  Data Review Lab Results  Component Value Date   HGBA1C 5 8 07/22/2015     Assessment & Plan   1. Cough Will cover for atypicals; consider reflux also - azithromycin (ZITHROMAX) 250 MG tablet; Take 2 today then 1 daily  Dispense: 6 tablet; Refill: 0 - omeprazole (PRILOSEC) 20 MG capsule; Take 1 capsule (20 mg total) by mouth daily.  Dispense: 30 capsule; Refill: 3   Patient have been counseled extensively about nutrition and exercise  Return for keep 7/12 appt with Dr Alvis LemmingsNewlin .  The patient was given clear instructions to go to ER or return  to medical center if symptoms don't improve, worsen or new problems develop. The patient verbalized understanding. The patient was told to call to get lab results if they haven't heard anything in the next week.     Georgian CoAngela McClung, PA-C [facility] [place], [place] [phone]   10/19/2017, 10:02 AM

## 2017-10-19 NOTE — Patient Instructions (Signed)
Drink 80 ounces of water daily     Muscle Cramps and Spasms Muscle cramps and spasms are when muscles tighten by themselves. They usually get better within minutes. Muscle cramps are painful. They are usually stronger and last longer than muscle spasms. Muscle spasms may or may not be painful. They can last a few seconds or much longer. Follow these instructions at home:  Drink enough fluid to keep your pee (urine) clear or pale yellow.  Massage, stretch, and relax the muscle.  If directed, apply heat to tight or tense muscles as often as told by your doctor. Use the heat source that your doctor recommends. ? Place a towel between your skin and the heat source. ? Leave the heat on for 20-30 minutes. ? Take off the heat if your skin turns bright red. This is especially important if you are unable to feel pain, heat, or cold. You may have a greater risk of getting burned.  If directed, put ice on the affected area. This may help if you are sore or have pain after a cramp or spasm. ? Put ice in a plastic bag. ? Place a towel between your skin and the bag. ? Leave the ice on for 20 minutes, 2-3 times a day.  Take over-the-counter and prescription medicines only as told by your doctor.  Pay attention to any changes in your symptoms. Contact a doctor if:  Your cramps or spasms get worse or happen more often.  Your cramps or spasms do not get better with time. This information is not intended to replace advice given to you by your health care provider. Make sure you discuss any questions you have with your health care provider. Document Released: 04/08/2008 Document Revised: 05/28/2015 Document Reviewed: 01/28/2015 Elsevier Interactive Patient Education  2018 [employer]

## 2017-11-18 ENCOUNTER — Ambulatory Visit: Payer: BLUE CROSS/BLUE SHIELD | Attending: Family Medicine | Admitting: Family Medicine

## 2017-11-18 VITALS — BP 119/71 | HR 84 | Temp 98.6°F | Resp 18 | Ht 66.0 in | Wt 205.0 lb

## 2017-11-18 DIAGNOSIS — I10 Essential (primary) hypertension: Secondary | ICD-10-CM | POA: Diagnosis not present

## 2017-11-18 DIAGNOSIS — Z1239 Encounter for other screening for malignant neoplasm of breast: Secondary | ICD-10-CM

## 2017-11-18 DIAGNOSIS — E669 Obesity, unspecified: Secondary | ICD-10-CM | POA: Diagnosis not present

## 2017-11-18 DIAGNOSIS — I4510 Unspecified right bundle-branch block: Secondary | ICD-10-CM | POA: Insufficient documentation

## 2017-11-18 DIAGNOSIS — Z1231 Encounter for screening mammogram for malignant neoplasm of breast: Secondary | ICD-10-CM

## 2017-11-18 DIAGNOSIS — Z79899 Other long term (current) drug therapy: Secondary | ICD-10-CM | POA: Diagnosis not present

## 2017-11-18 DIAGNOSIS — K589 Irritable bowel syndrome without diarrhea: Secondary | ICD-10-CM | POA: Diagnosis not present

## 2017-11-18 DIAGNOSIS — K219 Gastro-esophageal reflux disease without esophagitis: Secondary | ICD-10-CM | POA: Diagnosis not present

## 2017-11-18 DIAGNOSIS — Z7982 Long term (current) use of aspirin: Secondary | ICD-10-CM | POA: Insufficient documentation

## 2017-11-18 DIAGNOSIS — R059 Cough, unspecified: Secondary | ICD-10-CM

## 2017-11-18 DIAGNOSIS — Z6833 Body mass index (BMI) 33.0-33.9, adult: Secondary | ICD-10-CM | POA: Diagnosis not present

## 2017-11-18 DIAGNOSIS — I471 Supraventricular tachycardia: Secondary | ICD-10-CM | POA: Insufficient documentation

## 2017-11-18 DIAGNOSIS — Z131 Encounter for screening for diabetes mellitus: Secondary | ICD-10-CM | POA: Insufficient documentation

## 2017-11-18 DIAGNOSIS — Z0000 Encounter for general adult medical examination without abnormal findings: Secondary | ICD-10-CM

## 2017-11-18 DIAGNOSIS — Z0001 Encounter for general adult medical examination with abnormal findings: Secondary | ICD-10-CM | POA: Insufficient documentation

## 2017-11-18 DIAGNOSIS — R05 Cough: Secondary | ICD-10-CM | POA: Diagnosis not present

## 2017-11-18 LAB — POCT URINALYSIS DIPSTICK
Bilirubin, UA: NEGATIVE
Blood, UA: NEGATIVE
Glucose, UA: NEGATIVE
KETONES UA: NEGATIVE
Nitrite, UA: NEGATIVE
PROTEIN UA: NEGATIVE
SPEC GRAV UA: 1.015 (ref 1.010–1.025)
UROBILINOGEN UA: 0.2 U/dL
pH, UA: 6.5 (ref 5.0–8.0)

## 2017-11-18 MED ORDER — HYDROCHLOROTHIAZIDE 25 MG PO TABS: 12.5000 mg | ORAL_TABLET | Freq: Every day | ORAL | 3 refills | Status: DC

## 2017-11-18 MED ORDER — AMLODIPINE BESYLATE 2.5 MG PO TABS: 2.5000 mg | ORAL_TABLET | Freq: Every day | ORAL | 3 refills | Status: DC

## 2017-11-18 NOTE — Progress Notes (Signed)
Needed labs 11/18/2017 are in epic, waiting for pt return.

## 2017-11-18 NOTE — Progress Notes (Signed)
Patient ID: Kayla Preston, female    DOB: [birth year], 67 y.o.   MRN: [id number]  PCP: Kayla Register, MD  Chief Complaint  Patient presents with  . Annual Exam    Subjective:  HPI Kayla Preston is a 67 y.o. female presents for an annual exam and evaluation of chronic persistent cough. Medical problems include: Hypertension, RBBB, SVT, and Obesity.  Health Summary/PE She is current on all health maintenance. Colonoscopy completed within the last 12 month by [facility] and she has 3 polyps removed which were non-cancerous. She is taking Linzess for IBS which was prescribed by gastroenterology.  Drinks plenty of water and eats diet rich in fresh vegetables. Blood pressure has remained stable. She doesn't add salt to food. Denies edema, shortness of breath, chest pain, palpitations, weakness, or headaches. Reports occasionally exercise which is walking and gardening. Last eye exam within the 12 month at [facility]. She will be scheduling cataracts surgery at [facility] within the coming weeks. She is also in the process of having major dental work completed now that she is insured.  Persistent Cough/Chronic  Kayla Preston is concerned today regarding an on-going persistent, non-productive cough. Cough has been present for 6 months. Cough is worse at night. She has been treated with antibiotics, anti-tussive, and antihistamine therapy with minimal relief. During this period of time when the cough began her blood pressure medication was changed from metoprolol and to lisinopril-HCTZ. She has a history of GERD and takes omeprazole consistently. Denies esophagitis symptoms occurring in combination with the cough, facial or lip swelling, or rash development. Social History   Socioeconomic History  . Marital status: Single    Spouse name: Not on file  . Number of children: Not on file  . Years of education: Not on file  . Highest education level: Not on file  Occupational History   . Not on file  Social Needs  . Financial resource strain: Not on file  . Food insecurity:    Worry: Not on file    Inability: Not on file  . Transportation needs:    Medical: Not on file    Non-medical: Not on file  Tobacco Use  . Smoking status: Never Smoker  . Smokeless tobacco: Never Used  Substance and Sexual Activity  . Alcohol use: No  . Drug use: No  . Sexual activity: Never  Lifestyle  . Physical activity:    Days per week: Not on file    Minutes per session: Not on file  . Stress: Not on file  Relationships  . Social connections:    Talks on phone: Not on file    Gets together: Not on file    Attends religious service: Not on file    Active member of club or organization: Not on file    Attends meetings of clubs or organizations: Not on file    Relationship status: Not on file  . Intimate partner violence:    Fear of current or ex partner: Not on file    Emotionally abused: Not on file    Physically abused: Not on file    Forced sexual activity: Not on file  Other Topics Concern  . Not on file  Social History Narrative  . Not on file   History reviewed. No pertinent family history.  Review of Systems  Constitutional: Negative.   HENT: Negative.   Eyes: Negative for photophobia and visual disturbance.       Needs eye exam. Wears  corrective lenses.  Respiratory: Positive for cough. Negative for choking, chest tightness, shortness of breath and wheezing.   Cardiovascular: Negative.   Gastrointestinal: Negative.   Endocrine: Negative.   Genitourinary: Negative.   Musculoskeletal: Negative.   Skin: Negative.   Allergic/Immunologic: Negative.   Neurological: Negative.   Hematological: Negative.   Psychiatric/Behavioral: Negative for agitation, behavioral problems, decreased concentration, dysphoric mood, sleep disturbance and suicidal ideas. The patient is not nervous/anxious and is not hyperactive.        Recent loss of sister in law-patient reports  coping with loss and gardening helps with episodic saddness    Patient Active Problem List   Diagnosis Date Noted  . Seasonal allergies 09/13/2017  . Essential hypertension 06/15/2017  . Cervical polyp 06/16/2016  . Anemia, iron deficiency 07/22/2015  . Awareness of heartbeats 07/05/2011  . Bundle branch block, right 07/05/2011  . Supraventricular tachycardia (HCC) 07/05/2011    No Known Allergies  Prior to Admission medications   Medication Sig Start Date End Date Taking? Authorizing Provider  aspirin EC 81 MG tablet Take 81 mg by mouth daily.   Yes [provider]  cetirizine (ZYRTEC) 10 MG tablet Take 1 tablet (10 mg total) by mouth daily. 09/13/17  Yes Kayla Register, MD  Cyanocobalamin (VITAMIN B 12 PO) Take 1 tablet by mouth daily. Reported on 07/22/2015   Yes [provider]  lisinopril-hydrochlorothiazide (PRINZIDE,ZESTORETIC) 10-12.5 MG tablet Take 1 tablet by mouth daily. 09/13/17  Yes Kayla Register, MD  methocarbamol (ROBAXIN) 500 MG tablet Take 1 tablet (500 mg total) by mouth every 8 (eight) hours as needed for muscle spasms. 06/13/17  Yes Hairston, Oren Beckmann, FNP  omeprazole (PRILOSEC) 20 MG capsule Take 1 capsule (20 mg total) by mouth daily. 10/19/17  Yes Anders Simmonds, PA-C    Past Medical, Surgical Family and Social History reviewed and updated.    Objective:    Today's Vitals   11/18/17 0912  BP: 119/71  Pulse: 84  Resp: 18  Temp: 98.6 F (37 C)  TempSrc: Oral  SpO2: 98%  Weight: 205 lb (93 kg)  Height: 5\' 6"  (1.676 m)  PainSc: 0-No pain    Wt Readings from Last 3 Encounters:  11/18/17 205 lb (93 kg)  10/19/17 205 lb 12.8 oz (93.4 kg)  09/13/17 207 lb 6.4 oz (94.1 kg)    Physical Exam  Constitutional: She is oriented to person, place, and time. She appears well-developed and well-nourished.  HENT:  Head: Normocephalic and atraumatic.  Right Ear: External ear normal.  Left Ear: External ear normal.  Mouth/Throat: Oropharynx  is clear and moist. Abnormal dentition. Dental caries present.  Missing and broken teeth  Eyes: Pupils are equal, round, and reactive to light. EOM are normal.  Neck: Normal range of motion. Neck supple. No thyromegaly present.  Cardiovascular: Normal rate, regular rhythm, normal heart sounds and intact distal pulses. Exam reveals no gallop and no friction rub.  No murmur heard. Pulmonary/Chest: Effort normal and breath sounds normal.  Breasts are symmetric without cutaneous changes, nipple inversion or discharge. No masses or tenderness, and no axillary lymphadenopathy. Last PAP 2018-no abnormalities noted    Abdominal: Soft. Bowel sounds are normal. She exhibits no distension and no mass. There is no tenderness. There is no rebound and no guarding. No hernia.  Musculoskeletal: Normal range of motion.  Lymphadenopathy:    She has no cervical adenopathy.  Neurological: She is alert and oriented to person, place, and time.  Skin: Skin is warm  and dry.  Psychiatric: She has a normal mood and affect. Her behavior is normal. Judgment and thought content normal.  Vitals reviewed.  Assessment & Plan:  1. Annual physical exam -Age appropriate anticipatory guidance provided  2. Breast cancer screening, no family history of breast cancer. Preventative screening only. - MM Digital Screening; Future  3. Essential hypertension.  Patient blood pressures well controlled today however patient has experienced an ongoing persistent cough over the course of the last 7 months since starting lisinopril HCTZ.  Suspect she may be experiencing an ACE inhibitor induced cough.  Will DC lisinopril HCTZ today and trial amlodipine 2.5 mg once daily and continue hydrochlorothiazide 12.5 mg by mouth daily.  Patient is to return in 4 weeks for blood pressure recheck medication may require titration to maintain acceptable range of blood pressure.  Patient also advised to follow-up with office if cough persist in spite of  medication change. - TSH - Hemoglobin A1c - Lipid panel - Urinalysis Dipstick  4. Cough(checking CBC today. Suspect cough maybe Ace-inhibitor related. Hypertension therapy changed today.   5. Class 1 obesity without serious comorbidity with body mass index (BMI) of 33.0 to 33.9 in adult, unspecified obesity type Encouraged efforts to reduce weight include engaging in physical activity as tolerated with goal of 150 minutes per week. Improve dietary choices and eat a meal regimen consistent with a Mediterranean or DASH diet. Reduce simple carbohydrates. Do not skip meals and eat healthy snacks throughout the day to avoid over-eating at dinner. Set a goal weight loss that is achievable for you. Screening for diabetes and will check a fasting lipid panel today.   Meds ordered this encounter  Medications  . amLODipine (NORVASC) 2.5 MG tablet    Sig: Take 1 tablet (2.5 mg total) by mouth daily.    Dispense:  30 tablet    Refill:  3  . hydrochlorothiazide (HYDRODIURIL) 25 MG tablet    Sig: Take 0.5 tablets (12.5 mg total) by mouth daily.    Dispense:  90 tablet    Refill:  3    Orders Placed This Encounter  Procedures  . MM Digital Screening  . CBC with Differential  . Comprehensive metabolic panel  . TSH  . Hemoglobin A1c  . Lipid panel  . Urinalysis Dipstick     Return in 4 week for BP recheck.  Kayla PickKimberly S. Tiburcio PeaHarris, MSN, [facility]NP-C [facility]  [address] [place], [place], [place] [address] [phone]

## 2017-11-18 NOTE — Patient Instructions (Addendum)
Discontinue lisinopril HCTZ. Start Amlodipine 2.5 mg once daily and HCTZ 12.5 mg. Return in 4 weeks for a blood pressure recheck. If your cough is related to the Lisinopril, the cough should stop now that medication has been discontinued.     Goal BMI  <30.  Encouraged efforts to reduce weight include engaging in physical activity as tolerated with goal of 150 minutes per week. Improve dietary choices and eat a meal regimen consistent with a Mediterranean or DASH diet. Reduce simple carbohydrates. Do not skip meals and eat healthy snacks throughout the day to avoid over-eating at dinner. Set a goal weight loss that is achievable for you.   DASH Eating Plan DASH stands for "Dietary Approaches to Stop Hypertension." The DASH eating plan is a healthy eating plan that has been shown to reduce high blood pressure (hypertension). It may also reduce your risk for type 2 diabetes, heart disease, and stroke. The DASH eating plan may also help with weight loss. What are tips for following this plan? General guidelines  Avoid eating more than 2,300 mg (milligrams) of salt (sodium) a day. If you have hypertension, you may need to reduce your sodium intake to 1,500 mg a day.  Limit alcohol intake to no more than 1 drink a day for nonpregnant women and 2 drinks a day for men. One drink equals 12 oz of beer, 5 oz of wine, or 1 oz of hard liquor.  Work with your health care provider to maintain a healthy body weight or to lose weight. Ask what an ideal weight is for you.  Get at least 30 minutes of exercise that causes your heart to beat faster (aerobic exercise) most days of the week. Activities may include walking, swimming, or biking.  Work with your health care provider or diet and nutrition specialist (dietitian) to adjust your eating plan to your individual calorie needs. Reading food labels  Check food labels for the amount of sodium per serving. Choose foods with less than 5 percent of the Daily Value  of sodium. Generally, foods with less than 300 mg of sodium per serving fit into this eating plan.  To find whole grains, look for the word "whole" as the first word in the ingredient list. Shopping  Buy products labeled as "low-sodium" or "no salt added."  Buy fresh foods. Avoid canned foods and premade or frozen meals. Cooking  Avoid adding salt when cooking. Use salt-free seasonings or herbs instead of table salt or sea salt. Check with your health care provider or pharmacist before using salt substitutes.  Do not fry foods. Cook foods using healthy methods such as baking, boiling, grilling, and broiling instead.  Cook with heart-healthy oils, such as olive, canola, soybean, or sunflower oil. Meal planning   Eat a balanced diet that includes: ? 5 or more servings of fruits and vegetables each day. At each meal, try to fill half of your plate with fruits and vegetables. ? Up to 6-8 servings of whole grains each day. ? Less than 6 oz of lean meat, poultry, or fish each day. A 3-oz serving of meat is about the same size as a deck of cards. One egg equals 1 oz. ? 2 servings of low-fat dairy each day. ? A serving of nuts, seeds, or beans 5 times each week. ? Heart-healthy fats. Healthy fats called Omega-3 fatty acids are found in foods such as flaxseeds and coldwater fish, like sardines, salmon, and mackerel.  Limit how much you eat of the  following: ? Canned or prepackaged foods. ? Food that is high in trans fat, such as fried foods. ? Food that is high in saturated fat, such as fatty meat. ? Sweets, desserts, sugary drinks, and other foods with added sugar. ? Full-fat dairy products.  Do not salt foods before eating.  Try to eat at least 2 vegetarian meals each week.  Eat more home-cooked food and less restaurant, buffet, and fast food.  When eating at a restaurant, ask that your food be prepared with less salt or no salt, if possible. What foods are recommended? The items  listed may not be a complete list. Talk with your dietitian about what dietary choices are best for you. Grains Whole-grain or whole-wheat bread. Whole-grain or whole-wheat pasta. Brown rice. Orpah Cobb. Bulgur. Whole-grain and low-sodium cereals. Pita bread. Low-fat, low-sodium crackers. Whole-wheat flour tortillas. Vegetables Fresh or frozen vegetables (raw, steamed, roasted, or grilled). Low-sodium or reduced-sodium tomato and vegetable juice. Low-sodium or reduced-sodium tomato sauce and tomato paste. Low-sodium or reduced-sodium canned vegetables. Fruits All fresh, dried, or frozen fruit. Canned fruit in natural juice (without added sugar). Meat and other protein foods Skinless chicken or [place]. Ground chicken or [place]. Pork with fat trimmed off. Fish and seafood. Egg whites. Dried beans, peas, or lentils. Unsalted nuts, nut butters, and seeds. Unsalted canned beans. Lean cuts of beef with fat trimmed off. Low-sodium, lean deli meat. Dairy Low-fat (1%) or fat-free (skim) milk. Fat-free, low-fat, or reduced-fat cheeses. Nonfat, low-sodium ricotta or cottage cheese. Low-fat or nonfat yogurt. Low-fat, low-sodium cheese. Fats and oils Soft margarine without trans fats. Vegetable oil. Low-fat, reduced-fat, or light mayonnaise and salad dressings (reduced-sodium). Canola, safflower, olive, soybean, and sunflower oils. Avocado. Seasoning and other foods Herbs. Spices. Seasoning mixes without salt. Unsalted popcorn and pretzels. Fat-free sweets. What foods are not recommended? The items listed may not be a complete list. Talk with your dietitian about what dietary choices are best for you. Grains Baked goods made with fat, such as croissants, muffins, or some breads. Dry pasta or rice meal packs. Vegetables Creamed or fried vegetables. Vegetables in a cheese sauce. Regular canned vegetables (not low-sodium or reduced-sodium). Regular canned tomato sauce and paste (not low-sodium or  reduced-sodium). Regular tomato and vegetable juice (not low-sodium or reduced-sodium). Rosita Fire. Olives. Fruits Canned fruit in a light or heavy syrup. Fried fruit. Fruit in cream or butter sauce. Meat and other protein foods Fatty cuts of meat. Ribs. Fried meat. Tomasa Blase. Sausage. Bologna and other processed lunch meats. Salami. Fatback. Hotdogs. Bratwurst. Salted nuts and seeds. Canned beans with added salt. Canned or smoked fish. Whole eggs or egg yolks. Chicken or [place] with skin. Dairy Whole or 2% milk, cream, and half-and-half. Whole or full-fat cream cheese. Whole-fat or sweetened yogurt. Full-fat cheese. Nondairy creamers. Whipped toppings. Processed cheese and cheese spreads. Fats and oils Butter. Stick margarine. Lard. Shortening. Ghee. Bacon fat. Tropical oils, such as coconut, palm kernel, or palm oil. Seasoning and other foods Salted popcorn and pretzels. Onion salt, garlic salt, seasoned salt, table salt, and sea salt. Worcestershire sauce. Tartar sauce. Barbecue sauce. Teriyaki sauce. Soy sauce, including reduced-sodium. Steak sauce. Canned and packaged gravies. Fish sauce. Oyster sauce. Cocktail sauce. Horseradish that you find on the shelf. Ketchup. Mustard. Meat flavorings and tenderizers. Bouillon cubes. Hot sauce and Tabasco sauce. Premade or packaged marinades. Premade or packaged taco seasonings. Relishes. Regular salad dressings. Where to find more information:  National Heart, Lung, and Blood Institute: PopSteam.is  American Heart Association: www.heart.org  Summary  The DASH eating plan is a healthy eating plan that has been shown to reduce high blood pressure (hypertension). It may also reduce your risk for type 2 diabetes, heart disease, and stroke.  With the DASH eating plan, you should limit salt (sodium) intake to 2,300 mg a day. If you have hypertension, you may need to reduce your sodium intake to 1,500 mg a day.  When on the DASH eating plan, aim to eat more  fresh fruits and vegetables, whole grains, lean proteins, low-fat dairy, and heart-healthy fats.  Work with your health care provider or diet and nutrition specialist (dietitian) to adjust your eating plan to your individual calorie needs. This information is not intended to replace advice given to you by your health care provider. Make sure you discuss any questions you have with your health care provider. Document Released: 04/15/2011 Document Revised: 04/19/2016 Document Reviewed: 04/19/2016 Elsevier Interactive Patient Education  [employer].

## 2017-11-19 LAB — HEMOGLOBIN A1C
ESTIMATED AVERAGE GLUCOSE: 126 mg/dL
Hgb A1c MFr Bld: 6 % — ABNORMAL HIGH (ref 4.8–5.6)

## 2017-11-22 LAB — CBC WITH DIFFERENTIAL/PLATELET
BASOS ABS: 0 10*3/uL (ref 0.0–0.2)
Basos: 0 %
EOS (ABSOLUTE): 0.1 10*3/uL (ref 0.0–0.4)
Eos: 2 %
HEMATOCRIT: 41.8 % (ref 34.0–46.6)
Hemoglobin: 12.9 g/dL (ref 11.1–15.9)
Immature Grans (Abs): 0 10*3/uL (ref 0.0–0.1)
Immature Granulocytes: 0 %
LYMPHS ABS: 2.2 10*3/uL (ref 0.7–3.1)
Lymphs: 47 %
MCH: 23.7 pg — AB (ref 26.6–33.0)
MCHC: 30.9 g/dL — AB (ref 31.5–35.7)
MCV: 77 fL — ABNORMAL LOW (ref 79–97)
MONOS ABS: 0.4 10*3/uL (ref 0.1–0.9)
Monocytes: 9 %
NEUTROS ABS: 1.9 10*3/uL (ref 1.4–7.0)
NEUTROS PCT: 42 %
Platelets: 268 10*3/uL (ref 150–450)
RBC: 5.45 x10E6/uL — AB (ref 3.77–5.28)
RDW: 17.3 % — AB (ref 12.3–15.4)
WBC: 4.6 10*3/uL (ref 3.4–10.8)

## 2017-11-22 LAB — SPECIMEN STATUS REPORT

## 2017-11-24 ENCOUNTER — Telehealth: Payer: Self-pay | Admitting: Family Medicine

## 2017-11-24 NOTE — Telephone Encounter (Signed)
Please contact patient to advise her hemoglobin A1C is elevated 6.0 which has increased from prior level of 5.8. This means she continues to be prediabetic. I encourage her to increase physical activity with efforts to walk at least 20-30 minutes most days of the week, limit intake and portions of starch rich foods (rice, bread, and potatoes). I will leave her with option of starting Metformin 500 mg once daily. I think medication with help improve her A1C and facilitate weigh loss. Please let me know if she opts to start metformin and I will prescribed accordingly.  I had ordered Lipid panel, cmp and TSH which have not resulted. I recommend she return for lab visit to have these level drawn

## 2017-11-29 NOTE — Telephone Encounter (Signed)
Patient verified DOB Patient is aware of A1C increasing from 5.8 to 6.0. Patient advised to implement 20-30 minutes of walking and eliminate bread rice and pasta from her diet. Patient is aware of metformin being an option for daily control. Patient states she would like to control with nutrition prior to medication. Patient is also aware of needing to have labs drawn again and will complete at her nurse visit on 12/08/17. No further questions. Patient was scheduled for 9:00 LAB appointment for redraw prior to nurse BP visit.

## 2017-12-08 ENCOUNTER — Encounter: Payer: BLUE CROSS/BLUE SHIELD | Admitting: Pharmacist

## 2017-12-08 ENCOUNTER — Other Ambulatory Visit: Payer: BLUE CROSS/BLUE SHIELD

## 2017-12-12 ENCOUNTER — Ambulatory Visit: Payer: BLUE CROSS/BLUE SHIELD | Attending: Family Medicine | Admitting: Pharmacist

## 2017-12-12 ENCOUNTER — Encounter: Payer: Self-pay | Admitting: Pharmacist

## 2017-12-12 VITALS — BP 110/71 | HR 87

## 2017-12-12 DIAGNOSIS — R05 Cough: Secondary | ICD-10-CM | POA: Insufficient documentation

## 2017-12-12 DIAGNOSIS — I10 Essential (primary) hypertension: Secondary | ICD-10-CM | POA: Insufficient documentation

## 2017-12-12 NOTE — Progress Notes (Signed)
   S: PCP - Dr. Alvis LemmingsNewlin     Patient arrives in good spirits. Presents to the clinic for BP re-check. Patient was referred by Jerrilyn CairoKim Harris on 11/18/17.  Patient was last seen by Primary Care Provider on 09/13/17.   HPI:  Visit with Jerrilyn CairoKim Harris (11/18/17): Pt reported persistent cough ongoing for 7 months. She was taking lisinopril-HCTZ at the time. Was suspected that cough was coming from lisinopril-HCTZ. This was discontinued and amlodipine 2.5 mg daily was started along with HCTZ 12.5 mg daily.  Today, patient denies chest pain, dizziness, SOB, HA. Reports no problems with current medications. States that her cough is resolved since DC of lisinopril-HCTZ. Patient reports adherence with medications.  Current BP Medications include:   - amlodipine 2.5 mg daily - HCTZ 12.5 mg daily (1/2 of the 25 mg tablet)  Antihypertensives tried in the past include:  - lisinopril-hctz 10-12.5 mg daily (discontinued d/t cough) - metoprolol tartrate 12.5 mg BID  Dietary habits include: limits salt/caffeine Exercise habits include: pt states she gets > 30 mins a day by gardening  Family / Social history:  - none noted in [facility]  - never smoker  Home BP readings:  - no readings at home  - Reports that she can tell when it's hih - "I see little spots"g  O:  BP today 110/71, HR 87  Last 3 Office BP readings: BP Readings from Last 3 Encounters:  11/18/17 119/71  10/19/17 108/68  09/13/17 139/81   BMET    Component Value Date/Time   NA 139 07/02/2017 0931   K 4.1 07/02/2017 0931   CL 106 07/02/2017 0931   CO2 23 07/02/2017 0931   GLUCOSE 110 (H) 07/02/2017 0931   BUN 11 07/02/2017 0931   CREATININE 0.80 07/02/2017 0931   CREATININE 0.89 05/17/2016 1004   CALCIUM 9.4 07/02/2017 0931   GFRNONAA >60 07/02/2017 0931   GFRAA >60 07/02/2017 0931    Renal function: CrCl cannot be calculated (Patient's most recent lab result is older than the maximum 21 days allowed.).  A/P: Hypertension longstanding  currently controlled on medications. BP Goal <130/80 mmHg. Patient is adherent with current medications.  -Continued all anti-hypertensives.  - Lipid, TSH, CMP today -Counseled on lifestyle modifications for blood pressure control including reduced dietary sodium, increased exercise, adequate sleep  Results reviewed and written information provided.   Total time in face-to-face counseling 15 minutes.   F/U clinic with PCP.    Patient seen with Mosie LukesMichele Muir, PharmD Candidate [facility] [employer]  Butch PennyLuke Van Ausdall, PharmD, CPP Clinical Pharmacist [facility] [phone]

## 2017-12-12 NOTE — Patient Instructions (Signed)
Thank you for coming to see us today. Blood pressure is well-controlled. Continue taking blood pressure medications as prescribed.   If you have any questions about medications, please call me [phone].  Franky MachoLuke

## 2017-12-13 ENCOUNTER — Encounter: Payer: BLUE CROSS/BLUE SHIELD | Admitting: Pharmacist

## 2017-12-13 LAB — COMPREHENSIVE METABOLIC PANEL
ALBUMIN: 4.3 g/dL (ref 3.6–4.8)
ALK PHOS: 83 IU/L (ref 39–117)
ALT: 23 IU/L (ref 0–32)
AST: 21 IU/L (ref 0–40)
Albumin/Globulin Ratio: 1.7 (ref 1.2–2.2)
BUN/Creatinine Ratio: 21 (ref 12–28)
BUN: 17 mg/dL (ref 8–27)
Bilirubin Total: 0.3 mg/dL (ref 0.0–1.2)
CALCIUM: 9.6 mg/dL (ref 8.7–10.3)
CHLORIDE: 106 mmol/L (ref 96–106)
CO2: 18 mmol/L — ABNORMAL LOW (ref 20–29)
Creatinine, Ser: 0.8 mg/dL (ref 0.57–1.00)
GFR calc Af Amer: 90 mL/min/{1.73_m2} (ref >59)
GFR calc non Af Amer: 78 mL/min/{1.73_m2} (ref >59)
GLUCOSE: 90 mg/dL (ref 65–99)
Globulin, Total: 2.6 g/dL (ref 1.5–4.5)
POTASSIUM: 4 mmol/L (ref 3.5–5.2)
Sodium: 140 mmol/L (ref 134–144)
Total Protein: 6.9 g/dL (ref 6.0–8.5)

## 2017-12-13 LAB — LIPID PANEL
CHOL/HDL RATIO: 4.1 ratio (ref 0.0–4.4)
CHOLESTEROL TOTAL: 220 mg/dL — AB (ref 100–199)
HDL: 54 mg/dL (ref >39)
LDL Calculated: 146 mg/dL — ABNORMAL HIGH (ref 0–99)
TRIGLYCERIDES: 98 mg/dL (ref 0–149)
VLDL Cholesterol Cal: 20 mg/dL (ref 5–40)

## 2017-12-13 LAB — TSH: TSH: 1.45 u[IU]/mL (ref 0.450–4.500)

## 2017-12-15 ENCOUNTER — Telehealth: Payer: Self-pay

## 2017-12-15 NOTE — Telephone Encounter (Signed)
Patient was called and informed to contact office for lab results. 

## 2017-12-15 NOTE — Telephone Encounter (Signed)
-----   Message from Hoy RegisterEnobong Newlin, MD sent at 12/15/2017  1:28 PM EDT ----- Labs revealed elevated cholesterol and LDL (bad cholesterol).  Could you please verify if she was fasting at the time of lab draw?  Thank you

## 2017-12-16 ENCOUNTER — Telehealth: Payer: Self-pay

## 2017-12-16 NOTE — Telephone Encounter (Signed)
Advise to work on low cholesterol diet and we will repeat labs at next visit.

## 2017-12-16 NOTE — Telephone Encounter (Signed)
Patient was called and informed of doctors orders and will schedule appointment.

## 2017-12-16 NOTE — Telephone Encounter (Signed)
Patient returned phone call and informed me that she ate late the night before and cant really remember if she ate the morning of the test.

## 2017-12-21 ENCOUNTER — Ambulatory Visit: Payer: BLUE CROSS/BLUE SHIELD | Attending: Family Medicine

## 2017-12-21 DIAGNOSIS — Z13220 Encounter for screening for lipoid disorders: Secondary | ICD-10-CM | POA: Insufficient documentation

## 2017-12-21 NOTE — Progress Notes (Signed)
Patient here for lab visit only 

## 2017-12-22 LAB — LIPID PANEL
Chol/HDL Ratio: 3.4 ratio (ref 0.0–4.4)
Cholesterol, Total: 201 mg/dL — ABNORMAL HIGH (ref 100–199)
HDL: 59 mg/dL (ref >39)
LDL CALC: 129 mg/dL — AB (ref 0–99)
TRIGLYCERIDES: 63 mg/dL (ref 0–149)
VLDL Cholesterol Cal: 13 mg/dL (ref 5–40)

## 2017-12-26 ENCOUNTER — Telehealth (INDEPENDENT_AMBULATORY_CARE_PROVIDER_SITE_OTHER): Payer: Self-pay

## 2017-12-26 NOTE — Telephone Encounter (Signed)
-----   Message from Hoy RegisterEnobong Newlin, MD sent at 12/22/2017  1:33 PM EDT ----- Cholesterol is slightly elevated.  Please encourage adherence to a low cholesterol diet and exercise.  No medication will be commenced at this time

## 2017-12-26 NOTE — Telephone Encounter (Signed)
Patient was called and informed of lab results. 

## 2017-12-29 ENCOUNTER — Telehealth: Payer: Self-pay | Admitting: Family Medicine

## 2017-12-29 NOTE — Telephone Encounter (Signed)
Mail Dental resources to patient so she can contact the closes to her

## 2017-12-29 NOTE — Telephone Encounter (Signed)
Pt called to request an update on her dental referral placed on 03/08/2018, she has [employer] dental and needs to see someone as soon as possible. Please follow up

## 2018-01-11 ENCOUNTER — Encounter: Payer: Self-pay | Admitting: Family Medicine

## 2018-01-11 ENCOUNTER — Ambulatory Visit: Payer: BLUE CROSS/BLUE SHIELD | Attending: Family Medicine | Admitting: Family Medicine

## 2018-01-11 VITALS — BP 131/80 | HR 82 | Temp 98.0°F | Ht 66.0 in | Wt 199.0 lb

## 2018-01-11 DIAGNOSIS — Z23 Encounter for immunization: Secondary | ICD-10-CM

## 2018-01-11 DIAGNOSIS — E669 Obesity, unspecified: Secondary | ICD-10-CM | POA: Insufficient documentation

## 2018-01-11 DIAGNOSIS — I471 Supraventricular tachycardia: Secondary | ICD-10-CM

## 2018-01-11 DIAGNOSIS — Z7982 Long term (current) use of aspirin: Secondary | ICD-10-CM | POA: Insufficient documentation

## 2018-01-11 DIAGNOSIS — Z0000 Encounter for general adult medical examination without abnormal findings: Secondary | ICD-10-CM

## 2018-01-11 DIAGNOSIS — Z6832 Body mass index (BMI) 32.0-32.9, adult: Secondary | ICD-10-CM | POA: Insufficient documentation

## 2018-01-11 DIAGNOSIS — Z79899 Other long term (current) drug therapy: Secondary | ICD-10-CM | POA: Insufficient documentation

## 2018-01-11 DIAGNOSIS — I10 Essential (primary) hypertension: Secondary | ICD-10-CM | POA: Diagnosis not present

## 2018-01-11 MED ORDER — HYDROCHLOROTHIAZIDE 25 MG PO TABS: 12.5000 mg | ORAL_TABLET | Freq: Every day | ORAL | 6 refills | Status: DC

## 2018-01-11 MED ORDER — AMLODIPINE BESYLATE 2.5 MG PO TABS: 2.5000 mg | ORAL_TABLET | Freq: Every day | ORAL | 6 refills | Status: DC

## 2018-01-11 NOTE — Progress Notes (Signed)
Subjective:  Patient ID: Kayla Preston, female    DOB: 02-Sep-1950  Age: 67 y.o. MRN: [id number]  CC: Hypertension   HPI Kayla Preston  is a 67 year old female with a history of hypertension, previous history of SVT here for a follow up visit. She has been  Doing a lot of walking and has lost about 8 lbs since her  last visit and is adherent to a low sodium diet with her diet consisting of a lot of vegetables. She tolerates her antihypertensive and denies adverse effects from her medications She has no additional concerns today.  Past Medical History:  Diagnosis Date  . Atrial fibrillation (HCC)   . Atrial fibrillation (HCC)    she had ablation which resolved intermittent afib  . Obesity   . Seasonal allergies     Past Surgical History:  Procedure Laterality Date  . ABLATION      No Known Allergies   Outpatient Medications Prior to Visit  Medication Sig Dispense Refill  . cetirizine (ZYRTEC) 10 MG tablet Take 1 tablet (10 mg total) by mouth daily. 30 tablet 1  . Cyanocobalamin (VITAMIN B 12 PO) Take 1 tablet by mouth daily. Reported on 07/22/2015    . methocarbamol (ROBAXIN) 500 MG tablet Take 1 tablet (500 mg total) by mouth every 8 (eight) hours as needed for muscle spasms. 30 tablet 0  . amLODipine (NORVASC) 2.5 MG tablet Take 1 tablet (2.5 mg total) by mouth daily. 30 tablet 3  . hydrochlorothiazide (HYDRODIURIL) 25 MG tablet Take 0.5 tablets (12.5 mg total) by mouth daily. 90 tablet 3  . omeprazole (PRILOSEC) 20 MG capsule Take 1 capsule (20 mg total) by mouth daily. 30 capsule 3  . aspirin EC 81 MG tablet Take 81 mg by mouth daily.     No facility-administered medications prior to visit.     ROS Review of Systems  Constitutional: Negative for activity change, appetite change and fatigue.  HENT: Negative for congestion, sinus pressure and sore throat.   Eyes: Negative for visual disturbance.  Respiratory: Negative for cough, chest tightness, shortness of  breath and wheezing.   Cardiovascular: Negative for chest pain and palpitations.  Gastrointestinal: Negative for abdominal distention, abdominal pain and constipation.  Endocrine: Negative for polydipsia.  Genitourinary: Negative for dysuria and frequency.  Musculoskeletal: Negative for arthralgias and back pain.  Skin: Negative for rash.  Neurological: Negative for tremors, light-headedness and numbness.  Hematological: Does not bruise/bleed easily.  Psychiatric/Behavioral: Negative for agitation and behavioral problems.    Objective:  BP 131/80   Pulse 82   Temp 98 F (36.7 C) (Oral)   Ht 5\' 6"  (1.676 m)   Wt 199 lb (90.3 kg)   SpO2 93%   BMI 32.12 kg/m   BP/Weight 01/11/2018 12/12/2017 11/18/2017  Systolic BP 131 110 119  Diastolic BP 80 71 71  Wt. (Lbs) 199 - 205  BMI 32.12 - 33.09      Physical Exam  Constitutional: She is oriented to person, place, and time. She appears well-developed and well-nourished.  Cardiovascular: Normal rate, normal heart sounds and intact distal pulses.  No murmur heard. Pulmonary/Chest: Effort normal and breath sounds normal. She has no wheezes. She has no rales. She exhibits no tenderness.  Abdominal: Soft. Bowel sounds are normal. She exhibits no distension and no mass. There is no tenderness.  Musculoskeletal: Normal range of motion.  Neurological: She is alert and oriented to person, place, and time.  Skin: Skin is warm and dry.  Psychiatric: She has a normal mood and affect.      CMP Latest Ref Rng & Units 12/12/2017 07/02/2017 05/17/2016  Glucose 65 - 99 mg/dL 90 161(W) 90  BUN 8 - 27 mg/dL 17 11 18   Creatinine 0.57 - 1.00 mg/dL 9.60 4.54 0.98  Sodium 134 - 144 mmol/L 140 139 140  Potassium 3.5 - 5.2 mmol/L 4.0 4.1 4.3  Chloride 96 - 106 mmol/L 106 106 107  CO2 20 - 29 mmol/L 18(L) 23 22  Calcium 8.7 - 10.3 mg/dL 9.6 9.4 9.6  Total Protein 6.0 - 8.5 g/dL 6.9 7.5 7.0  Total Bilirubin 0.0 - 1.2 mg/dL 0.3 0.5 0.4  Alkaline Phos 39 -  117 IU/L 83 73 73  AST 0 - 40 IU/L 21 32 18  ALT 0 - 32 IU/L 23 27 21      Lipid Panel     Component Value Date/Time   CHOL 201 (H) 12/21/2017 0900   TRIG 63 12/21/2017 0900   HDL 59 12/21/2017 0900   CHOLHDL 3.4 12/21/2017 0900   CHOLHDL 3.4 05/17/2016 1004   VLDL 18 05/17/2016 1004   LDLCALC 129 (H) 12/21/2017 0900    Assessment & Plan:   1. Essential hypertension Controlled Counseled on blood pressure goal of less than 130/80, low-sodium, DASH diet, medication compliance, 150 minutes of moderate intensity exercise per week. Discussed medication compliance, adverse effects. - amLODipine (NORVASC) 2.5 MG tablet; Take 1 tablet (2.5 mg total) by mouth daily.  Dispense: 30 tablet; Refill: 6 - hydrochlorothiazide (HYDRODIURIL) 25 MG tablet; Take 0.5 tablets (12.5 mg total) by mouth daily.  Dispense: 30 tablet; Refill: 6  2. Health Care Maintenance Colonoscopy was in 08/2016; repeat in 3 years per [facility]   Due to polyps Has up coming appointment for mammogram  Meds ordered this encounter  Medications  . amLODipine (NORVASC) 2.5 MG tablet    Sig: Take 1 tablet (2.5 mg total) by mouth daily.    Dispense:  30 tablet    Refill:  6  . hydrochlorothiazide (HYDRODIURIL) 25 MG tablet    Sig: Take 0.5 tablets (12.5 mg total) by mouth daily.    Dispense:  30 tablet    Refill:  6    Follow-up: Return in about 6 months (around 07/12/2018) for follow-up of chronic medical conditions.   Hoy Register MD

## 2018-01-11 NOTE — Patient Instructions (Signed)

## 2018-01-18 ENCOUNTER — Ambulatory Visit
Admission: RE | Admit: 2018-01-18 | Discharge: 2018-01-18 | Disposition: A | Discharge status: Home / Self Care | Payer: BLUE CROSS/BLUE SHIELD | Source: Ambulatory Visit | Attending: Family Medicine | Admitting: Family Medicine

## 2018-01-18 DIAGNOSIS — Z1239 Encounter for other screening for malignant neoplasm of breast: Secondary | ICD-10-CM

## 2018-02-17 MED FILL — AMLODIPINE 2.5 MG TABLET: 2.5 | 30 days supply | Qty: 30 | Fill #0

## 2018-02-17 MED FILL — HYDROCHLOROTHIAZIDE 25 MG T: 25 | 30 days supply | Qty: 15 | Fill #0

## 2018-03-06 ENCOUNTER — Other Ambulatory Visit: Payer: Self-pay

## 2018-03-06 ENCOUNTER — Emergency Department (HOSPITAL_COMMUNITY): Payer: Medicare Other

## 2018-03-06 ENCOUNTER — Encounter (HOSPITAL_COMMUNITY): Payer: Self-pay | Admitting: *Deleted

## 2018-03-06 ENCOUNTER — Emergency Department (HOSPITAL_COMMUNITY)
Admission: EM | Admit: 2018-03-06 | Discharge: 2018-03-06 | Disposition: A | Discharge status: Home / Self Care | Payer: Medicare Other | Attending: Emergency Medicine | Admitting: Emergency Medicine

## 2018-03-06 DIAGNOSIS — Z7982 Long term (current) use of aspirin: Secondary | ICD-10-CM | POA: Diagnosis not present

## 2018-03-06 DIAGNOSIS — Z79899 Other long term (current) drug therapy: Secondary | ICD-10-CM | POA: Diagnosis not present

## 2018-03-06 DIAGNOSIS — J069 Acute upper respiratory infection, unspecified: Secondary | ICD-10-CM | POA: Diagnosis not present

## 2018-03-06 DIAGNOSIS — I10 Essential (primary) hypertension: Secondary | ICD-10-CM | POA: Insufficient documentation

## 2018-03-06 DIAGNOSIS — R062 Wheezing: Secondary | ICD-10-CM | POA: Diagnosis not present

## 2018-03-06 DIAGNOSIS — J988 Other specified respiratory disorders: Secondary | ICD-10-CM | POA: Diagnosis not present

## 2018-03-06 DIAGNOSIS — R0602 Shortness of breath: Secondary | ICD-10-CM | POA: Diagnosis present

## 2018-03-06 DIAGNOSIS — R05 Cough: Secondary | ICD-10-CM | POA: Diagnosis not present

## 2018-03-06 HISTORY — DX: Essential (primary) hypertension: I10

## 2018-03-06 LAB — CBC WITH DIFFERENTIAL/PLATELET
BASOS ABS: 0 10*3/uL (ref 0.0–0.1)
BASOS PCT: 1 %
EOS PCT: 4 %
Eosinophils Absolute: 0.2 10*3/uL (ref 0.0–0.5)
HEMATOCRIT: 41.9 % (ref 36.0–46.0)
Hemoglobin: 13.1 g/dL (ref 12.0–15.0)
LYMPHS PCT: 45 %
Lymphs Abs: 1.8 10*3/uL (ref 0.7–4.0)
MCH: 23.4 pg — ABNORMAL LOW (ref 26.0–34.0)
MCHC: 31.3 g/dL (ref 30.0–36.0)
MCV: 75 fL — AB (ref 80.0–100.0)
Monocytes Absolute: 0.4 10*3/uL (ref 0.1–1.0)
Monocytes Relative: 9 %
NEUTROS ABS: 1.7 10*3/uL (ref 1.7–7.7)
NRBC: 0 % (ref 0.0–0.2)
Neutrophils Relative %: 41 %
PLATELETS: 225 10*3/uL (ref 150–400)
RBC: 5.59 MIL/uL — AB (ref 3.87–5.11)
RDW: 16.4 % — AB (ref 11.5–15.5)
WBC: 4.1 10*3/uL (ref 4.0–10.5)
nRBC: 0 /100 WBC

## 2018-03-06 LAB — I-STAT TROPONIN, ED: Troponin i, poc: 0.01 ng/mL (ref 0.00–0.08)

## 2018-03-06 MED ORDER — ALBUTEROL SULFATE HFA 108 (90 BASE) MCG/ACT IN AERS
2.0000 | INHALATION_SPRAY | Freq: Once | RESPIRATORY_TRACT | Status: AC
  Administered 2018-03-06: 2 via RESPIRATORY_TRACT
  Filled 2018-03-06: qty 6.7

## 2018-03-06 MED ORDER — ALBUTEROL SULFATE (2.5 MG/3ML) 0.083% IN NEBU
5.0000 mg | INHALATION_SOLUTION | Freq: Once | RESPIRATORY_TRACT | Status: AC
  Administered 2018-03-06: 5 mg via RESPIRATORY_TRACT
  Filled 2018-03-06: qty 6

## 2018-03-06 MED ORDER — PREDNISONE 20 MG PO TABS
60.0000 mg | ORAL_TABLET | Freq: Once | ORAL | Status: AC
  Administered 2018-03-06: 60 mg via ORAL
  Filled 2018-03-06: qty 3

## 2018-03-06 MED ORDER — PREDNISONE 20 MG PO TABS: ORAL_TABLET | ORAL | 0 refills | Status: DC

## 2018-03-06 MED ORDER — IPRATROPIUM-ALBUTEROL 0.5-2.5 (3) MG/3ML IN SOLN
3.0000 mL | RESPIRATORY_TRACT | Status: AC
  Administered 2018-03-06 (×3): 3 mL via RESPIRATORY_TRACT
  Filled 2018-03-06 (×3): qty 3

## 2018-03-06 NOTE — ED Notes (Signed)
Patient transported to X-ray 

## 2018-03-06 NOTE — Discharge Instructions (Addendum)
Use your inhaler every 4 hours(4-6 puffs) while awake, return for sudden worsening shortness of breath, or if you need to use your inhaler more often.   Take tylenol 2 pills 4 times a day and motrin 4 pills 3 times a day.  Drink plenty of fluids.  Return for worsening shortness of breath, headache, confusion. Follow up with your family doctor.

## 2018-03-06 NOTE — ED Triage Notes (Signed)
C/o sob onset last fri. With cough and chest pain , states she has been with her grandson who is sick also

## 2018-03-06 NOTE — ED Provider Notes (Signed)
[facility] EMERGENCY DEPARTMENT Provider Note   CSN: [id number] Arrival date & time: 03/06/18  4098     History   Chief Complaint Chief Complaint  Patient presents with  . Shortness of Breath    HPI Tandra Rosado is a 67 y.o. female.  67 yo F with a chief complaint of shortness of breath cough and congestion.  This been going on for the past 3 to 4 days.  The patient has been around a family member who was sick.  She also was outside in the rain and thinks that may be that got her sick as well.  She is a former smoker denies history of asthma or COPD.  She is coughing quite a bit but having trouble getting any productive sputum.  Tried Mucinex without improvement.  The history is provided by the patient.  Illness  This is a new problem. The current episode started 2 days ago. The problem occurs constantly. The problem has been gradually worsening. Associated symptoms include shortness of breath. Pertinent negatives include no chest pain, no abdominal pain and no headaches. Nothing aggravates the symptoms. Nothing relieves the symptoms. She has tried nothing for the symptoms. The treatment provided no relief.    Past Medical History:  Diagnosis Date  . Atrial fibrillation (HCC)   . Atrial fibrillation (HCC)    she had ablation which resolved intermittent afib  . Hypertension   . Obesity   . Seasonal allergies     Patient Active Problem List   Diagnosis Date Noted  . Seasonal allergies 09/13/2017  . Essential hypertension 06/15/2017  . Cervical polyp 06/16/2016  . Anemia, iron deficiency 07/22/2015  . Awareness of heartbeats 07/05/2011  . Bundle branch block, right 07/05/2011  . Supraventricular tachycardia (HCC) 07/05/2011    Past Surgical History:  Procedure Laterality Date  . ABLATION       OB History   None      Home Medications    Prior to Admission medications   Medication Sig Start Date End Date Taking? Authorizing Provider    amLODipine (NORVASC) 2.5 MG tablet Take 1 tablet (2.5 mg total) by mouth daily. 01/11/18   Hoy Register, MD  aspirin EC 81 MG tablet Take 81 mg by mouth daily.    [provider]  cetirizine (ZYRTEC) 10 MG tablet Take 1 tablet (10 mg total) by mouth daily. 09/13/17   Hoy Register, MD  Cyanocobalamin (VITAMIN B 12 PO) Take 1 tablet by mouth daily. Reported on 07/22/2015    [provider]  hydrochlorothiazide (HYDRODIURIL) 25 MG tablet Take 0.5 tablets (12.5 mg total) by mouth daily. 01/11/18   Hoy Register, MD  methocarbamol (ROBAXIN) 500 MG tablet Take 1 tablet (500 mg total) by mouth every 8 (eight) hours as needed for muscle spasms. 06/13/17   Lizbeth Bark, FNP  predniSONE (DELTASONE) 20 MG tablet 2 tabs po daily x 4 days 03/06/18   Melene Plan, DO    Family History No family history on file.  Social History Social History   Tobacco Use  . Smoking status: Never Smoker  . Smokeless tobacco: Never Used  Substance Use Topics  . Alcohol use: No  . Drug use: No     Allergies   Patient has no known allergies.   Review of Systems Review of Systems  Constitutional: Negative for chills and fever.  HENT: Negative for congestion and rhinorrhea.   Eyes: Negative for redness and visual disturbance.  Respiratory: Positive for shortness  of breath. Negative for wheezing.   Cardiovascular: Negative for chest pain and palpitations.  Gastrointestinal: Negative for abdominal pain, nausea and vomiting.  Genitourinary: Negative for dysuria and urgency.  Musculoskeletal: Negative for arthralgias and myalgias.  Skin: Negative for pallor and wound.  Neurological: Negative for dizziness and headaches.     Physical Exam Updated Vital Signs BP 129/83   Pulse 99   Temp 98.3 F (36.8 C) (Oral)   Resp 17   Ht 5\' 6"  (1.676 m)   Wt 85.7 kg   SpO2 100%   BMI 30.51 kg/m   Physical Exam  Constitutional: She is oriented to person, place, and time. She appears  well-developed and well-nourished. No distress.  HENT:  Head: Normocephalic and atraumatic.  Swollen turbinates, posterior nasal drip, no noted sinus ttp, tm normal bilaterally.    Eyes: Pupils are equal, round, and reactive to light. EOM are normal.  Neck: Normal range of motion. Neck supple.  Cardiovascular: Normal rate and regular rhythm. Exam reveals no gallop and no friction rub.  No murmur heard. Pulmonary/Chest: Effort normal. She has wheezes (End expiratory wheezes in all fields). She has no rales.  Abdominal: Soft. She exhibits no distension. There is no tenderness.  Musculoskeletal: She exhibits no edema or tenderness.  Neurological: She is alert and oriented to person, place, and time.  Skin: Skin is warm and dry. She is not diaphoretic.  Psychiatric: She has a normal mood and affect. Her behavior is normal.  Nursing note and vitals reviewed.    ED Treatments / Results  Labs (all labs ordered are listed, but only abnormal results are displayed) Labs Reviewed  CBC WITH DIFFERENTIAL/PLATELET - Abnormal; Notable for the following components:      Result Value   RBC 5.59 (*)    MCV 75.0 (*)    MCH 23.4 (*)    RDW 16.4 (*)    All other components within normal limits  PATHOLOGIST SMEAR REVIEW  I-STAT TROPONIN, ED    EKG EKG Interpretation  Date/Time:  Monday March 06 2018 06:52:27 EDT Ventricular Rate:  83 PR Interval:  140 QRS Duration: 134 QT Interval:  396 QTC Calculation: 465 R Axis:   23 Text Interpretation:  Normal sinus rhythm Right bundle branch block Abnormal ECG No significant change since last tracing Confirmed by Melene Plan [phone]) on 03/06/2018 7:03:04 AM   Radiology Dg Chest 2 View  Result Date: 03/06/2018 CLINICAL DATA:  Cough EXAM: CHEST - 2 VIEW COMPARISON:  05/11/2015 FINDINGS: The heart size and mediastinal contours are within normal limits. Both lungs are clear. The visualized skeletal structures are unremarkable. IMPRESSION: No active  cardiopulmonary disease. Electronically Signed   By: Alcide Clever M.D.   On: 03/06/2018 07:42    Procedures Procedures (including critical care time)  Medications Ordered in ED Medications  albuterol (PROVENTIL HFA;VENTOLIN HFA) 108 (90 Base) MCG/ACT inhaler 2 puff (has no administration in time range)  albuterol (PROVENTIL) (2.5 MG/3ML) 0.083% nebulizer solution 5 mg (5 mg Nebulization Given 03/06/18 0701)  ipratropium-albuterol (DUONEB) 0.5-2.5 (3) MG/3ML nebulizer solution 3 mL (3 mLs Nebulization Given 03/06/18 0855)  predniSONE (DELTASONE) tablet 60 mg (60 mg Oral Given 03/06/18 0756)     Initial Impression / Assessment and Plan / ED Course  I have reviewed the triage vital signs and the nursing notes.  Pertinent labs & imaging results that were available during my care of the patient were reviewed by me and considered in my medical decision making (see chart for  details).     67 yo F with a chief complaints of URI-like symptoms.  Most likely this is viral, no bacterial source found on my exam.  Chest x-ray reviewed by me without focal infiltrate.  Troponin is negative, no leukocytosis no anemia.  She is wheezing on my exam we will give 3 duo nebs back-to-back and steroids and reassess.  Patient feeling much better on reassessment.  Repeat lung exam with minimal if any wheezes.  Patient feeling much better and requesting discharge home.  Will give an albuterol inhaler.  Have her follow-up with her family doctor.  9:12 AM:  I have discussed the diagnosis/risks/treatment options with the patient and believe the pt to be eligible for discharge home to follow-up with PCP. We also discussed returning to the ED immediately if new or worsening sx occur. We discussed the sx which are most concerning (e.g., sudden worsening sob, fever, need to use inhaler more often than every 4 hours) that necessitate immediate return. Medications administered to the patient during their visit and any new  prescriptions provided to the patient are listed below.  Medications given during this visit Medications  albuterol (PROVENTIL HFA;VENTOLIN HFA) 108 (90 Base) MCG/ACT inhaler 2 puff (has no administration in time range)  albuterol (PROVENTIL) (2.5 MG/3ML) 0.083% nebulizer solution 5 mg (5 mg Nebulization Given 03/06/18 0701)  ipratropium-albuterol (DUONEB) 0.5-2.5 (3) MG/3ML nebulizer solution 3 mL (3 mLs Nebulization Given 03/06/18 0855)  predniSONE (DELTASONE) tablet 60 mg (60 mg Oral Given 03/06/18 0756)      The patient appears reasonably screen and/or stabilized for discharge and I doubt any other medical condition or other [facility] requiring further screening, evaluation, or treatment in the ED at this time prior to discharge.    Final Clinical Impressions(s) / ED Diagnoses   Final diagnoses:  Wheezing-associated respiratory infection (WARI)    ED Discharge Orders         Ordered    predniSONE (DELTASONE) 20 MG tablet     03/06/18 0909           Melene Plan, DO 03/06/18 1610

## 2018-03-07 LAB — PATHOLOGIST SMEAR REVIEW: PATH REVIEW: INCREASED

## 2018-03-07 MED FILL — predniSONE 20 MG TABS: 20 | 4 days supply | Qty: 8 | Fill #0

## 2018-03-13 ENCOUNTER — Encounter: Payer: Self-pay | Admitting: Critical Care Medicine

## 2018-03-13 ENCOUNTER — Ambulatory Visit: Payer: Medicare Other | Attending: Critical Care Medicine | Admitting: Critical Care Medicine

## 2018-03-13 VITALS — BP 123/76 | HR 78 | Temp 98.1°F | Resp 18 | Ht 65.0 in | Wt 204.0 lb

## 2018-03-13 DIAGNOSIS — J069 Acute upper respiratory infection, unspecified: Secondary | ICD-10-CM | POA: Insufficient documentation

## 2018-03-13 DIAGNOSIS — J4540 Moderate persistent asthma, uncomplicated: Secondary | ICD-10-CM | POA: Diagnosis not present

## 2018-03-13 DIAGNOSIS — J219 Acute bronchiolitis, unspecified: Secondary | ICD-10-CM | POA: Insufficient documentation

## 2018-03-13 DIAGNOSIS — B9789 Other viral agents as the cause of diseases classified elsewhere: Secondary | ICD-10-CM | POA: Diagnosis not present

## 2018-03-13 MED ORDER — BECLOMETHASONE DIPROP HFA 40 MCG/ACT IN AERB: 2.0000 | INHALATION_SPRAY | Freq: Two times a day (BID) | RESPIRATORY_TRACT | 6 refills | Status: DC

## 2018-03-13 MED ORDER — ALBUTEROL SULFATE HFA 108 (90 BASE) MCG/ACT IN AERS: 2.0000 | INHALATION_SPRAY | Freq: Four times a day (QID) | RESPIRATORY_TRACT | 4 refills | Status: DC | PRN

## 2018-03-13 MED FILL — QVAR REDIHALER 40 MCG/ACT A: 40 | 30 days supply | Qty: 11 | Fill #0

## 2018-03-13 MED FILL — ALBUTEROL SULFATE HFA 108 (: 108 (90 BAS | 25 days supply | Qty: 18 | Fill #0

## 2018-03-13 NOTE — Patient Instructions (Signed)
Start Qvar 2 puffs twice daily Continue albuterol 2 puffs every 6 hours as needed for shortness of breath, chest tightness, or cough No additional prednisone needed No antibiotics needed Return 1 month for recheck

## 2018-03-13 NOTE — Assessment & Plan Note (Addendum)
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

## 2018-03-13 NOTE — Assessment & Plan Note (Signed)
Recent viral upper respiratory tract infection Now with post viral bronchiolitis and asthma with associated lower airway inflammation manifested by wheezing and cyclic cough Plan No additional systemic steroids needed Begin inhaled steroid Qvar 40 mcg strength 2 puffs twice daily Continue albuterol as needed

## 2018-03-13 NOTE — Progress Notes (Signed)
Subjective:    Patient ID: Kayla Preston, female    DOB: 1950-09-26, 67 y.o.   MRN: [id number]  This is a 67 year old female who was seen on 03/06/2018 for a URI induced wheezing syndrome.  The patient had a negative chest x-ray.  All of her laboratory data were normal including a negative troponin and normal EKG.  The patient was treated with 3 albuterol ipratropium nebulizer treatments and given a 60 mg dose of prednisone orally x1 along with a prescription for an albuterol rescue inhaler.  Note she has previously been diagnosed with supraventricular tachycardia.  Was in the rain in garden, cold wet, and then grandchildren came and was ill.  Pt lives is [place] area there is an odor and will awaken at night and cough.  Pt notes when coughs is dry itchy throat.  Wheezing is better , only at night   Since the emergency room visit   Shortness of Breath  The current episode started in the past 7 days. The problem occurs daily (only short of breath at night.). The problem has been rapidly improving. Associated symptoms include chest pain, orthopnea, rhinorrhea, a sore throat, sputum production and wheezing. Pertinent negatives include no ear pain, fever, hemoptysis, leg swelling, PND, rash or swollen glands. The symptoms are aggravated by fumes, odors, lying flat, URIs and weather changes. Associated symptoms comments: Pt notes green mucus Chest pain is gone, tightness is gone. There is no history of asthma, bronchiolitis, CAD, COPD, DVT, a heart failure, PE or pneumonia.   Past Medical History:  Diagnosis Date  . Atrial fibrillation (HCC)   . Atrial fibrillation (HCC)    she had ablation which resolved intermittent afib  . Hypertension   . Obesity   . Seasonal allergies      Family History  Problem Relation Age of Onset  . Heart disease Mother   . Heart disease Father   . Hyperlipidemia Sister      Social History   Socioeconomic History  . Marital status: Single   Spouse name: Not on file  . Number of children: Not on file  . Years of education: Not on file  . Highest education level: Not on file  Occupational History  . Not on file  Social Needs  . Financial resource strain: Not on file  . Food insecurity:    Worry: Not on file    Inability: Not on file  . Transportation needs:    Medical: Not on file    Non-medical: Not on file  Tobacco Use  . Smoking status: Never Smoker  . Smokeless tobacco: Never Used  Substance and Sexual Activity  . Alcohol use: No  . Drug use: No  . Sexual activity: Never  Lifestyle  . Physical activity:    Days per week: Not on file    Minutes per session: Not on file  . Stress: Not on file  Relationships  . Social connections:    Talks on phone: Not on file    Gets together: Not on file    Attends religious service: Not on file    Active member of club or organization: Not on file    Attends meetings of clubs or organizations: Not on file    Relationship status: Not on file  . Intimate partner violence:    Fear of current or ex partner: Not on file    Emotionally abused: Not on file    Physically abused: Not on file    Forced  sexual activity: Not on file  Other Topics Concern  . Not on file  Social History Narrative  . Not on file     No Known Allergies   Outpatient Medications Prior to Visit  Medication Sig Dispense Refill  . amLODipine (NORVASC) 2.5 MG tablet Take 1 tablet (2.5 mg total) by mouth daily. 30 tablet 6  . aspirin EC 81 MG tablet Take 81 mg by mouth daily.    . hydrochlorothiazide (HYDRODIURIL) 25 MG tablet Take 0.5 tablets (12.5 mg total) by mouth daily. 30 tablet 6  . methocarbamol (ROBAXIN) 500 MG tablet Take 1 tablet (500 mg total) by mouth every 8 (eight) hours as needed for muscle spasms. 30 tablet 0  . Cyanocobalamin (VITAMIN B 12 PO) Take 1 tablet by mouth daily. Reported on 07/22/2015    . LINZESS 145 MCG CAPS capsule Take 1 capsule by mouth daily.  2  . cetirizine (ZYRTEC)  10 MG tablet Take 1 tablet (10 mg total) by mouth daily. (Patient not taking: Reported on 03/13/2018) 30 tablet 1  . predniSONE (DELTASONE) 20 MG tablet 2 tabs po daily x 4 days 8 tablet 0   No facility-administered medications prior to visit.      Review of Systems  Constitutional: Negative for fever.  HENT: Positive for rhinorrhea and sore throat. Negative for ear pain.   Respiratory: Positive for sputum production, shortness of breath and wheezing. Negative for hemoptysis.   Cardiovascular: Positive for chest pain and orthopnea. Negative for leg swelling and PND.  Skin: Negative for rash.       Objective:   Physical Exam Vitals:   03/13/18 1414  BP: 123/76  Pulse: 78  Resp: 18  Temp: 98.1 F (36.7 C)  TempSrc: Oral  SpO2: 98%  Weight: 204 lb (92.5 kg)  Height: 5\' 5"  (1.651 m)    Gen: Pleasant, well-nourished, in no distress,  normal affect  ENT: No lesions,  mouth clear,  oropharynx clear, no postnasal drip  Neck: No JVD, no TMG, no carotid bruits  Lungs: No use of accessory muscles, no dullness to percussion, few expiratory wheezes  Cardiovascular: RRR, heart sounds normal, no murmur or gallops, no peripheral edema  Abdomen: soft and NT, no HSM,  BS normal  Musculoskeletal: No deformities, no cyanosis or clubbing  Neuro: alert, non focal  Skin: Warm, no lesions or rashes  No results found.        Assessment & Plan:  I personally reviewed all images and lab data in the [facility] system as well as any outside material available during this office visit and agree with the  radiology impressions.   Viral URI with cough Recent viral upper respiratory tract infection Now with post viral bronchiolitis and asthma with associated lower airway inflammation manifested by wheezing and cyclic cough Plan No additional systemic steroids needed Begin inhaled steroid Qvar 40 mcg strength 2 puffs twice daily Continue albuterol as needed  Moderate persistent asthma without  complication Moderate persistent asthma secondary to post viral bronchiolitis and associated environmental factors including fume exposure No evidence of reflux disease No evident chronic sinus disease  Plan To use short acting beta agonist as needed Begin Qvar 40 mcg strength 2 puffs twice daily No indication for antibiotics or systemic corticosteroids Return for recheck 1 month    Candra was seen today for hospitalization follow-up.  Diagnoses and all orders for this visit:  Moderate persistent asthma without complication  Viral URI with cough  Other orders -  beclomethasone (QVAR REDIHALER) 40 MCG/ACT inhaler; Inhale 2 puffs into the lungs 2 (two) times daily. -     albuterol (PROVENTIL HFA;VENTOLIN HFA) 108 (90 Base) MCG/ACT inhaler; Inhale 2 puffs into the lungs every 6 (six) hours as needed for wheezing or shortness of breath.

## 2018-04-18 ENCOUNTER — Ambulatory Visit: Payer: Medicare Other | Admitting: Critical Care Medicine

## 2018-04-24 ENCOUNTER — Ambulatory Visit: Payer: Medicare Other | Admitting: Critical Care Medicine

## 2018-04-25 MED FILL — AMLODIPINE 2.5 MG TABLET: 2.5 | 30 days supply | Qty: 30 | Fill #1

## 2018-04-25 MED FILL — LINZESS 145 MCG CAPSULE: 145 | 90 days supply | Qty: 90 | Fill #0

## 2018-06-28 ENCOUNTER — Ambulatory Visit: Payer: Medicare Other | Attending: Family Medicine | Admitting: Physician Assistant

## 2018-06-28 VITALS — BP 150/92 | HR 95 | Temp 98.3°F | Resp 18 | Ht 67.0 in

## 2018-06-28 DIAGNOSIS — L03011 Cellulitis of right finger: Secondary | ICD-10-CM

## 2018-06-28 DIAGNOSIS — I10 Essential (primary) hypertension: Secondary | ICD-10-CM | POA: Diagnosis not present

## 2018-06-28 DIAGNOSIS — N3000 Acute cystitis without hematuria: Secondary | ICD-10-CM | POA: Diagnosis not present

## 2018-06-28 LAB — POCT URINALYSIS DIP (CLINITEK)
Bilirubin, UA: NEGATIVE
Blood, UA: NEGATIVE
Glucose, UA: NEGATIVE mg/dL
Ketones, POC UA: NEGATIVE mg/dL
Leukocytes, UA: NEGATIVE
NITRITE UA: NEGATIVE
PH UA: 5.5 (ref 5.0–8.0)
POC PROTEIN,UA: NEGATIVE
Spec Grav, UA: 1.015 (ref 1.010–1.025)
UROBILINOGEN UA: 0.2 U/dL

## 2018-06-28 MED ORDER — FLUCONAZOLE 150 MG PO TABS: 150.0000 mg | ORAL_TABLET | Freq: Once | ORAL | 0 refills | Status: AC

## 2018-06-28 MED ORDER — CEPHALEXIN 500 MG PO CAPS: 500.0000 mg | ORAL_CAPSULE | Freq: Three times a day (TID) | ORAL | 0 refills | Status: DC

## 2018-06-28 MED FILL — CEPHALEXIN 500 MG CAPSULE: 500 | 10 days supply | Qty: 30 | Fill #0

## 2018-06-28 MED FILL — FLUCONAZOLE 150 MG TABS: 150 | 1 days supply | Qty: 1 | Fill #0

## 2018-06-28 NOTE — Progress Notes (Signed)
Patient ID: Kayla Preston, female   DOB: [birth year], 68 y.o.   MRN: [id number]      Kayla Preston, is a 68 y.o. female  [id number]  [id number]  DOB - 07/29/1950  Subjective:  Chief Complaint and HPI: Kayla Preston is a 68 y.o. female here today Lots of travel recently.  Several day h/o urinary frequency and some dysuria.  Not SA.  No f/c.  No abdominal pain.  Also, painful middle finger on R hand.  Gets nails done.  Wipes with R hand.    ROS:   Constitutional:  No f/c, No night sweats, No unexplained weight loss. EENT:  No vision changes, No blurry vision, No hearing changes. No mouth, throat, or ear problems.  Respiratory: No cough, No SOB Cardiac: No CP, no palpitations GI:  No abd pain, No N/V/D. GU: see above Musculoskeletal: No joint pain Neuro: No headache, no dizziness, no motor weakness.  Skin: No rash Endocrine:  No polydipsia. No polyuria.  Psych: Denies SI/HI  No problems updated.  ALLERGIES: No Known Allergies  PAST MEDICAL HISTORY: Past Medical History:  Diagnosis Date  . Atrial fibrillation (HCC)   . Atrial fibrillation (HCC)    she had ablation which resolved intermittent afib  . Hypertension   . Obesity   . Seasonal allergies     MEDICATIONS AT HOME: Prior to Admission medications   Medication Sig Start Date End Date Taking? Authorizing Provider  albuterol (PROVENTIL HFA;VENTOLIN HFA) 108 (90 Base) MCG/ACT inhaler Inhale 2 puffs into the lungs every 6 (six) hours as needed for wheezing or shortness of breath. 03/13/18  Yes Storm Frisk, MD  amLODipine (NORVASC) 2.5 MG tablet Take 1 tablet (2.5 mg total) by mouth daily. 01/11/18  Yes Hoy Register, MD  aspirin EC 81 MG tablet Take 81 mg by mouth daily.   Yes [provider]  beclomethasone (QVAR REDIHALER) 40 MCG/ACT inhaler Inhale 2 puffs into the lungs 2 (two) times daily. 03/13/18  Yes Storm Frisk, MD  Cyanocobalamin (VITAMIN B 12 PO) Take 1 tablet by mouth daily.  Reported on 07/22/2015   Yes [provider]  hydrochlorothiazide (HYDRODIURIL) 25 MG tablet Take 0.5 tablets (12.5 mg total) by mouth daily. 01/11/18  Yes Hoy Register, MD  LINZESS 145 MCG CAPS capsule Take 1 capsule by mouth daily. 01/18/18  Yes [provider]  methocarbamol (ROBAXIN) 500 MG tablet Take 1 tablet (500 mg total) by mouth every 8 (eight) hours as needed for muscle spasms. 06/13/17  Yes Hairston, Oren Beckmann, FNP  cephALEXin (KEFLEX) 500 MG capsule Take 1 capsule (500 mg total) by mouth 3 (three) times daily. 06/28/18   Anders Simmonds, PA-C  fluconazole (DIFLUCAN) 150 MG tablet Take 1 tablet (150 mg total) by mouth once for 1 dose. 06/28/18 06/28/18  Anders Simmonds, PA-C     Objective:  EXAM:   Vitals:   06/28/18 1422  BP: (!) 150/92  Pulse: 95  Resp: 18  Temp: 98.3 F (36.8 C)  TempSrc: Oral  SpO2: 97%  Height: 5\' 7"  (1.702 m)    General appearance : A&OX3. NAD. Non-toxic-appearing HEENT: Atraumatic and Normocephalic.  PERRLA. EOM intact.   Neck: supple, no JVD. No cervical lymphadenopathy. No thyromegaly Chest/Lungs:  Breathing-non-labored, Good air entry bilaterally, breath sounds normal without rales, rhonchi, or wheezing  CVS: S1 S2 regular, no murmurs, gallops, rubs  R hand-acrylic nails in place except middle finger.  Base of nail there is TTP with a small fluctuant paronychia.  Extremities: Bilateral Lower Ext shows no edema, both legs are warm to touch with = pulse throughout Neurology:  CN II-XII grossly intact, Non focal.   Psych:  TP linear. J/I WNL. Normal speech. Appropriate eye contact and affect.  Skin:  No Rash  Data Review Lab Results  Component Value Date   HGBA1C 6.0 (H) 11/18/2017   HGBA1C 5 8 07/22/2015     Assessment & Plan   1. Acute cystitis without hematuria Urinary hygiene. Increase water intake.   - POCT URINALYSIS DIP (CLINITEK) - cephALEXin (KEFLEX) 500 MG capsule; Take 1 capsule (500 mg total) by mouth 3  (three) times daily.  Dispense: 30 capsule; Refill: 0 - fluconazole (DIFLUCAN) 150 MG tablet; Take 1 tablet (150 mg total) by mouth once for 1 dose.  Dispense: 1 tablet; Refill: 0 - Urine Culture  2. Paronychia of finger of right hand Of middle finger R hand - cephALEXin (KEFLEX) 500 MG capsule; Take 1 capsule (500 mg total) by mouth 3 (three) times daily.  Dispense: 30 capsule; Refill: 0  3. Essential hypertension Take amlodipine!! We have discussed target BP range and blood pressure goal. I have advised patient to check BP regularly and to call us back or report to clinic if the numbers are consistently higher than 140/90. We discussed the importance of compliance with medical therapy and DASH diet recommended, consequences of uncontrolled hypertension discussed.     Patient have been counseled extensively about nutrition and exercise  Return in about 2 months (around 08/27/2018) for Dr Meade Maw and labs.  The patient was given clear instructions to go to ER or return to medical center if symptoms don't improve, worsen or new problems develop. The patient verbalized understanding. The patient was told to call to get lab results if they haven't heard anything in the next week.     Georgian Co, PA-C [facility] [place], [place] [phone]   06/28/2018, 2:35 PM

## 2018-06-28 NOTE — Patient Instructions (Signed)
Urinary Tract Infection, Adult A urinary tract infection (UTI) is an infection of any part of the urinary tract. The urinary tract includes:  The kidneys.  The ureters.  The bladder.  The urethra. These organs make, store, and get rid of pee (urine) in the body. What are the causes? This is caused by germs (bacteria) in your genital area. These germs grow and cause swelling (inflammation) of your urinary tract. What increases the risk? You are more likely to develop this condition if:  You have a small, thin tube (catheter) to drain pee.  You cannot control when you pee or poop (incontinence).  You are female, and: ? You use these methods to prevent pregnancy: ? A medicine that kills sperm (spermicide). ? A device that blocks sperm (diaphragm). ? You have low levels of a female hormone (estrogen). ? You are pregnant.  You have genes that add to your risk.  You are sexually active.  You take antibiotic medicines.  You have trouble peeing because of: ? A prostate that is bigger than normal, if you are female. ? A blockage in the part of your body that drains pee from the bladder (urethra). ? A kidney stone. ? A nerve condition that affects your bladder (neurogenic bladder). ? Not getting enough to drink. ? Not peeing often enough.  You have other conditions, such as: ? Diabetes. ? A weak disease-fighting system (immune system). ? Sickle cell disease. ? Gout. ? Injury of the spine. What are the signs or symptoms? Symptoms of this condition include:  Needing to pee right away (urgently).  Peeing often.  Peeing small amounts often.  Pain or burning when peeing.  Blood in the pee.  Pee that smells bad or not like normal.  Trouble peeing.  Pee that is cloudy.  Fluid coming from the vagina, if you are female.  Pain in the belly or lower back. Other symptoms include:  Throwing up (vomiting).  No urge to eat.  Feeling mixed up (confused).  Being tired  and grouchy (irritable).  A fever.  Watery poop (diarrhea). How is this treated? This condition may be treated with:  Antibiotic medicine.  Other medicines.  Drinking enough water. Follow these instructions at home:  Medicines  Take over-the-counter and prescription medicines only as told by your doctor.  If you were prescribed an antibiotic medicine, take it as told by your doctor. Do not stop taking it even if you start to feel better. General instructions  Make sure you: ? Pee until your bladder is empty. ? Do not hold pee for a long time. ? Empty your bladder after sex. ? Wipe from front to back after pooping if you are a female. Use each tissue one time when you wipe.  Drink enough fluid to keep your pee pale yellow.  Keep all follow-up visits as told by your doctor. This is important. Contact a doctor if:  You do not get better after 1-2 days.  Your symptoms go away and then come back. Get help right away if:  You have very bad back pain.  You have very bad pain in your lower belly.  You have a fever.  You are sick to your stomach (nauseous).  You are throwing up. Summary  A urinary tract infection (UTI) is an infection of any part of the urinary tract.  This condition is caused by germs in your genital area.  There are many risk factors for a UTI. These include having a small, thin   tube to drain pee and not being able to control when you pee or poop.  Treatment includes antibiotic medicines for germs.  Drink enough fluid to keep your pee pale yellow. This information is not intended to replace advice given to you by your health care provider. Make sure you discuss any questions you have with your health care provider. Document Released: 10/13/2007 Document Revised: 11/03/2017 Document Reviewed: 11/03/2017 Elsevier Interactive Patient Education  2019 Elsevier Inc.  

## 2018-06-30 LAB — URINE CULTURE

## 2018-07-03 ENCOUNTER — Telehealth: Payer: Self-pay | Admitting: Family Medicine

## 2018-07-03 ENCOUNTER — Telehealth: Payer: Self-pay | Admitting: *Deleted

## 2018-07-03 NOTE — Telephone Encounter (Signed)
-----   Message from Anders Simmonds, [place] sent at 07/01/2018  6:47 PM EST ----- Urine culture did not show any specific bacteria causing infection.  If symptoms persist or worsen beyond taking the antibiotic, return to clinic.  Otherwise, follow-up as planned.  Thanks, Georgian Co, PA-C

## 2018-07-03 NOTE — Telephone Encounter (Signed)
Medical Assistant left message on patient's home and cell voicemail. Voicemail states to give a call back to [place] with [facility] at [phone]. Please inform patient of no infection being noted and to follow up as planned.

## 2018-07-03 NOTE — Telephone Encounter (Signed)
Patient called back for their results. Please follow up 

## 2018-07-04 NOTE — Telephone Encounter (Signed)
MA spoke with patient and informed her of no infection being noted on the urine.

## 2018-07-27 ENCOUNTER — Other Ambulatory Visit: Payer: Self-pay

## 2018-07-27 ENCOUNTER — Ambulatory Visit: Payer: Medicare Other | Attending: Family Medicine | Admitting: Physician Assistant

## 2018-07-27 VITALS — BP 123/78 | HR 88 | Temp 97.9°F | Resp 16 | Ht 65.0 in | Wt 202.6 lb

## 2018-07-27 DIAGNOSIS — M545 Low back pain, unspecified: Secondary | ICD-10-CM

## 2018-07-27 DIAGNOSIS — R2232 Localized swelling, mass and lump, left upper limb: Secondary | ICD-10-CM | POA: Insufficient documentation

## 2018-07-27 DIAGNOSIS — R002 Palpitations: Secondary | ICD-10-CM

## 2018-07-27 DIAGNOSIS — I4891 Unspecified atrial fibrillation: Secondary | ICD-10-CM | POA: Diagnosis not present

## 2018-07-27 DIAGNOSIS — Z791 Long term (current) use of non-steroidal anti-inflammatories (NSAID): Secondary | ICD-10-CM | POA: Insufficient documentation

## 2018-07-27 DIAGNOSIS — I10 Essential (primary) hypertension: Secondary | ICD-10-CM | POA: Insufficient documentation

## 2018-07-27 DIAGNOSIS — Z7982 Long term (current) use of aspirin: Secondary | ICD-10-CM | POA: Insufficient documentation

## 2018-07-27 DIAGNOSIS — I4510 Unspecified right bundle-branch block: Secondary | ICD-10-CM | POA: Diagnosis not present

## 2018-07-27 DIAGNOSIS — Z6833 Body mass index (BMI) 33.0-33.9, adult: Secondary | ICD-10-CM | POA: Insufficient documentation

## 2018-07-27 DIAGNOSIS — Z79899 Other long term (current) drug therapy: Secondary | ICD-10-CM | POA: Insufficient documentation

## 2018-07-27 DIAGNOSIS — E559 Vitamin D deficiency, unspecified: Secondary | ICD-10-CM | POA: Diagnosis not present

## 2018-07-27 DIAGNOSIS — E669 Obesity, unspecified: Secondary | ICD-10-CM | POA: Diagnosis not present

## 2018-07-27 MED ORDER — CYCLOBENZAPRINE HCL 5 MG PO TABS: ORAL_TABLET | ORAL | 1 refills | Status: DC

## 2018-07-27 MED ORDER — NAPROXEN 500 MG PO TABS: 500.0000 mg | ORAL_TABLET | Freq: Two times a day (BID) | ORAL | 1 refills | Status: DC

## 2018-07-27 NOTE — Progress Notes (Signed)
Pt. Is here for lower back pain and palpitations.

## 2018-07-27 NOTE — Progress Notes (Signed)
Patient ID: Kayla Preston, female   DOB: 1951/02/03, 68 y.o.   MRN: [id number]   Kayla Preston, is a 68 y.o. female  ZES:[id number]  [id number]  DOB - [birth year]  Subjective:  Chief Complaint and HPI: Kayla Preston is a 68 y.o. female here today with multiple issues. C/o lower back pain in lumbar region down to below buttocks B.  Worse after being still and and feels stiff until she gets up and moving around.  No paresthesias or weakness.  Pain for about 6 weeks after travelling from [place].  No urinary s/sx.  Not taking any OTC.    Also c/o knot on L 4th finger.  Concerned about RA.  No fevers or tick bites.  No rash.  The area is tender. NKI.    Also c/o occasional palpitations.  Per her giving history, she has a h/o afib with ablation.  Hasn't seen cardiology in a long time.  Denies CP/SOB.  No radiating pain  No paresthesias.  No Nausea.  Occurs mostly/notices mostly when at rest.  Not occurring currently.  ROS:   Constitutional:  No f/c, No night sweats, No unexplained weight loss. EENT:  No vision changes, No blurry vision, No hearing changes. No mouth, throat, or ear problems.  Respiratory: No cough, No SOB Cardiac: No CP, + palpitations GI:  No abd pain, No N/V/D. GU: No Urinary s/sx Musculoskeletal: + LBP and finger pain Neuro: No headache, no dizziness, no motor weakness.  Skin: No rash Endocrine:  No polydipsia. No polyuria.  Psych: Denies SI/HI  No problems updated.  ALLERGIES: No Known Allergies  PAST MEDICAL HISTORY: Past Medical History:  Diagnosis Date  . Atrial fibrillation (HCC)   . Atrial fibrillation (HCC)    she had ablation which resolved intermittent afib  . Hypertension   . Obesity   . Seasonal allergies     MEDICATIONS AT HOME: Prior to Admission medications   Medication Sig Start Date End Date Taking? Authorizing Provider  albuterol (PROVENTIL HFA;VENTOLIN HFA) 108 (90 Base) MCG/ACT inhaler Inhale 2 puffs into the lungs every 6 (six)  hours as needed for wheezing or shortness of breath. 03/13/18  Yes Storm Frisk, MD  amLODipine (NORVASC) 2.5 MG tablet Take 1 tablet (2.5 mg total) by mouth daily. 01/11/18  Yes Hoy Register, MD  aspirin EC 81 MG tablet Take 81 mg by mouth daily.   Yes [provider]  beclomethasone (QVAR REDIHALER) 40 MCG/ACT inhaler Inhale 2 puffs into the lungs 2 (two) times daily. 03/13/18  Yes Storm Frisk, MD  hydrochlorothiazide (HYDRODIURIL) 25 MG tablet Take 0.5 tablets (12.5 mg total) by mouth daily. 01/11/18  Yes Hoy Register, MD  LINZESS 145 MCG CAPS capsule Take 1 capsule by mouth daily. 01/18/18  Yes [provider]  methocarbamol (ROBAXIN) 500 MG tablet Take 1 tablet (500 mg total) by mouth every 8 (eight) hours as needed for muscle spasms. 06/13/17  Yes Hairston, Oren Beckmann, FNP  cephALEXin (KEFLEX) 500 MG capsule Take 1 capsule (500 mg total) by mouth 3 (three) times daily. Patient not taking: Reported on 07/27/2018 06/28/18   Anders Simmonds, PA-C  Cyanocobalamin (VITAMIN B 12 PO) Take 1 tablet by mouth daily. Reported on 07/22/2015    [provider]  cyclobenzaprine (FLEXERIL) 5 MG tablet 1/2-1 tid X 5 days then prn muscle spasm 07/27/18   Anders Simmonds, PA-C  naproxen (NAPROSYN) 500 MG tablet Take 1 tablet (500 mg total) by mouth 2 (two) times daily with  a meal. X 1 week then prn pain 07/27/18   Anders Simmonds, PA-C     Objective:  EXAM:   Vitals:   07/27/18 0854  BP: 123/78  Pulse: 88  Resp: 16  Temp: 97.9 F (36.6 C)  TempSrc: Oral  SpO2: 100%  Weight: 202 lb 9.6 oz (91.9 kg)  Height: 5\' 5"  (1.651 m)    General appearance : A&OX3. NAD. Non-toxic-appearing HEENT: Atraumatic and Normocephalic.  PERRLA. EOM intact.   Neck: supple, no JVD. No cervical lymphadenopathy. No thyromegaly Chest/Lungs:  Breathing-non-labored, Good air entry bilaterally, breath sounds normal without rales, rhonchi, or wheezing  CVS: S1 S2 regular, no murmurs,  gallops, rubs  Back: ROM WNL for age.  Neg SLR B.  DTR=B.   L fourth finger: full S&ROM. ?small/barely noticeable and TTP nodule lateral to PIP Extremities: Bilateral Lower Ext shows no edema, both legs are warm to touch with = pulse throughout Neurology:  CN II-XII grossly intact, Non focal.  Psych:  TP linear. J/I WNL. pressured speech. Appropriate eye contact and anxious affect.  Skin:  No Rash  Data Review Lab Results  Component Value Date   HGBA1C 6.0 (H) 11/18/2017   HGBA1C 5 8 07/22/2015   EKG with RBBB.  No afib.  No ST/ischemic changes.  compared to EKG 02/2018  Assessment & Plan   1. Palpitations EKG with RBBB.  No afib.  No ST/ischemic changes.  compared to EKG 02/2018 - Vitamin D, 25-hydroxy - Comprehensive metabolic panel - TSH - Ambulatory referral to Cardiology Patient agrees to call [phone] if anything worsens, CP/SOB/dizziness develop  2. Acute bilateral low back pain without sciatica No red flags-likely arthritis - Sedimentation Rate - naproxen (NAPROSYN) 500 MG tablet; Take 1 tablet (500 mg total) by mouth 2 (two) times daily with a meal. X 1 week then prn pain  Dispense: 60 tablet; Refill: 1 - cyclobenzaprine (FLEXERIL) 5 MG tablet; 1/2-1 tid X 5 days then prn muscle spasm  Dispense: 30 tablet; Refill: 1  3. Nodule of finger of left hand - Sedimentation Rate  4. Essential hypertension Controlled-continue current regimen - Vitamin D, 25-hydroxy - Ambulatory referral to Cardiology  5. Vitamin D deficiency - Vitamin D, 25-hydroxy   Patient have been counseled extensively about nutrition and exercise  Return in about 1 month (around 08/27/2018) for [place] for chronic and newer conditions.  The patient was given clear instructions to go to ER or return to medical center if symptoms don't improve, worsen or new problems develop. The patient verbalized understanding. The patient was told to call to get lab results if they haven't heard anything in the next  week.     Georgian Co, PA-C [facility] [place], [place] [phone]   07/27/2018, 9:17 AM

## 2018-07-28 ENCOUNTER — Other Ambulatory Visit: Payer: Self-pay | Admitting: Physician Assistant

## 2018-07-28 ENCOUNTER — Telehealth: Payer: Self-pay | Admitting: *Deleted

## 2018-07-28 DIAGNOSIS — R7989 Other specified abnormal findings of blood chemistry: Secondary | ICD-10-CM

## 2018-07-28 LAB — COMPREHENSIVE METABOLIC PANEL
A/G RATIO: 2 (ref 1.2–2.2)
ALT: 41 IU/L — ABNORMAL HIGH (ref 0–32)
AST: 23 IU/L (ref 0–40)
Albumin: 4.5 g/dL (ref 3.8–4.8)
Alkaline Phosphatase: 77 IU/L (ref 39–117)
BUN/Creatinine Ratio: 21 (ref 12–28)
BUN: 15 mg/dL (ref 8–27)
Bilirubin Total: 0.3 mg/dL (ref 0.0–1.2)
CALCIUM: 9.7 mg/dL (ref 8.7–10.3)
CO2: 19 mmol/L — ABNORMAL LOW (ref 20–29)
Chloride: 104 mmol/L (ref 96–106)
Creatinine, Ser: 0.71 mg/dL (ref 0.57–1.00)
GFR, EST AFRICAN AMERICAN: 103 mL/min/{1.73_m2} (ref >59)
GFR, EST NON AFRICAN AMERICAN: 90 mL/min/{1.73_m2} (ref >59)
Globulin, Total: 2.3 g/dL (ref 1.5–4.5)
Glucose: 83 mg/dL (ref 65–99)
POTASSIUM: 4.2 mmol/L (ref 3.5–5.2)
SODIUM: 142 mmol/L (ref 134–144)
TOTAL PROTEIN: 6.8 g/dL (ref 6.0–8.5)

## 2018-07-28 LAB — SEDIMENTATION RATE: Sed Rate: 32 mm/hr (ref 0–40)

## 2018-07-28 LAB — VITAMIN D 25 HYDROXY (VIT D DEFICIENCY, FRACTURES): Vit D, 25-Hydroxy: 16.6 ng/mL — ABNORMAL LOW (ref 30.0–100.0)

## 2018-07-28 LAB — TSH: TSH: 0.02 u[IU]/mL — ABNORMAL LOW (ref 0.450–4.500)

## 2018-07-28 MED ORDER — METHIMAZOLE 5 MG PO TABS: 5.0000 mg | ORAL_TABLET | Freq: Every day | ORAL | 3 refills | Status: DC

## 2018-07-28 MED ORDER — VITAMIN D (ERGOCALCIFEROL) 1.25 MG (50000 UNIT) PO CAPS: 50000.0000 [IU] | ORAL_CAPSULE | ORAL | 0 refills | Status: DC

## 2018-07-28 MED FILL — methIMAzole 5 MG TABS: 5 | 30 days supply | Qty: 30 | Fill #0

## 2018-07-28 MED FILL — VIT D2 1.25 MG (50,000 UNIT: 1.25 MG | 84 days supply | Qty: 12 | Fill #0

## 2018-07-28 NOTE — Telephone Encounter (Signed)
Patient verified DOB Patient is aware of vitamin d being low and sent a weekly prescription to [facility]. Patient is also aware of thyroid overacting and needing to start a daily medication for that which may explain the patients recent palpitations. Patient is to follow up with an endocrinologist and with the office as planned.

## 2018-08-10 ENCOUNTER — Ambulatory Visit: Payer: Medicaid Other | Admitting: Family Medicine

## 2018-08-17 ENCOUNTER — Encounter: Payer: Self-pay | Admitting: Endocrinology

## 2018-09-01 ENCOUNTER — Telehealth: Payer: Self-pay | Admitting: Cardiovascular Disease

## 2018-09-01 NOTE — Telephone Encounter (Signed)
LMTCB to change visit to virtual. Has visit with Dr. Tresa Endo on 4/28 at 8:40 AM.

## 2018-09-04 ENCOUNTER — Telehealth: Payer: Self-pay | Admitting: Cardiovascular Disease

## 2018-09-04 NOTE — Telephone Encounter (Signed)
Virtual Visit Pre-Appointment Phone Call  "(Name), I am calling you today to discuss your upcoming appointment. We are currently trying to limit exposure to the virus that causes COVID-19 by seeing patients at home rather than in the office."  1. "What is the BEST phone number to call the day of the visit?" - include this in appointment notes  2. Do you have or have access to (through a family member/friend) a smartphone with video capability that we can use for your visit?" a. If yes - list this number in appt notes as cell (if different from BEST phone #) and list the appointment type as a VIDEO visit in appointment notes b. If no - list the appointment type as a PHONE visit in appointment notes  3. Confirm consent - "In the setting of the current Covid19 crisis, you are scheduled for a (phone or video) visit with your provider on (date) at (time).  Just as we do with many in-office visits, in order for you to participate in this visit, we must obtain consent.  If you'd like, I can send this to your mychart (if signed up) or email for you to review.  Otherwise, I can obtain your verbal consent now.  All virtual visits are billed to your insurance company just like a normal visit would be.  By agreeing to a virtual visit, we'd like you to understand that the technology does not allow for your provider to perform an examination, and thus may limit your provider's ability to fully assess your condition. If your provider identifies any concerns that need to be evaluated in person, we will make arrangements to do so.  Finally, though the technology is pretty good, we cannot assure that it will always work on either your or our end, and in the setting of a video visit, we may have to convert it to a phone-only visit.  In either situation, we cannot ensure that we have a secure connection.  Are you willing to proceed?" STAFF: Did the patient verbally acknowledge consent to telehealth visit? Document  YES/NO here: Yes  4. Advise patient to be prepared - "Two hours prior to your appointment, go ahead and check your blood pressure, pulse, oxygen saturation, and your weight (if you have the equipment to check those) and write them all down. When your visit starts, your provider will ask you for this information. If you have an Apple Watch or Kardia device, please plan to have heart rate information ready on the day of your appointment. Please have a pen and paper handy nearby the day of the visit as well."  5. Give patient instructions for MyChart download to smartphone OR Doximity/Doxy.me as below if video visit (depending on what platform provider is using)  6. Inform patient they will receive a phone call 15 minutes prior to their appointment time (may be from unknown caller ID) so they should be prepared to answer    TELEPHONE CALL NOTE  Kayla Preston has been deemed a candidate for a follow-up tele-health visit to limit community exposure during the Covid-19 pandemic. I spoke with the patient via phone to ensure availability of phone/video source, confirm preferred email & phone number, and discuss instructions and expectations.  I reminded Kayla Preston to be prepared with any vital sign and/or heart rhythm information that could potentially be obtained via home monitoring, at the time of her visit. I reminded Kayla Preston to expect a phone call prior to her visit.  Evans Lance 09/04/2018 1:26 PM   Pt stated she does have smartphone for video visit. Sent pt link for MyChart. She does not have bp cuff or weight scale at home.  IF USING DOXIMITY or DOXY.ME - The patient will receive a link just prior to their visit by text.     FULL LENGTH CONSENT FOR TELE-HEALTH VISIT   I hereby voluntarily request, consent and authorize [facility] and its employed or contracted physicians, physician assistants, nurse practitioners or other licensed health care professionals (the  Practitioner), to provide me with telemedicine health care services (the Services") as deemed necessary by the treating Practitioner. I acknowledge and consent to receive the Services by the Practitioner via telemedicine. I understand that the telemedicine visit will involve communicating with the Practitioner through live audiovisual communication technology and the disclosure of certain medical information by electronic transmission. I acknowledge that I have been given the opportunity to request an in-person assessment or other available alternative prior to the telemedicine visit and am voluntarily participating in the telemedicine visit.  I understand that I have the right to withhold or withdraw my consent to the use of telemedicine in the course of my care at any time, without affecting my right to future care or treatment, and that the Practitioner or I may terminate the telemedicine visit at any time. I understand that I have the right to inspect all information obtained and/or recorded in the course of the telemedicine visit and may receive copies of available information for a reasonable fee.  I understand that some of the potential risks of receiving the Services via telemedicine include:   Delay or interruption in medical evaluation due to technological equipment failure or disruption;  Information transmitted may not be sufficient (e.g. poor resolution of images) to allow for appropriate medical decision making by the Practitioner; and/or   In rare instances, security protocols could fail, causing a breach of personal health information.  Furthermore, I acknowledge that it is my responsibility to provide information about my medical history, conditions and care that is complete and accurate to the best of my ability. I acknowledge that Practitioner's advice, recommendations, and/or decision may be based on factors not within their control, such as incomplete or inaccurate data provided by me  or distortions of diagnostic images or specimens that may result from electronic transmissions. I understand that the practice of medicine is not an exact science and that Practitioner makes no warranties or guarantees regarding treatment outcomes. I acknowledge that I will receive a copy of this consent concurrently upon execution via email to the email address I last provided but may also request a printed copy by calling the office of [facility].    I understand that my insurance will be billed for this visit.   I have read or had this consent read to me.  I understand the contents of this consent, which adequately explains the benefits and risks of the Services being provided via telemedicine.   I have been provided ample opportunity to ask questions regarding this consent and the Services and have had my questions answered to my satisfaction.  I give my informed consent for the services to be provided through the use of telemedicine in my medical care  By participating in this telemedicine visit I agree to the above.

## 2018-09-04 NOTE — Telephone Encounter (Signed)
Called x3 for pre reg/LVM °

## 2018-09-05 ENCOUNTER — Telehealth (INDEPENDENT_AMBULATORY_CARE_PROVIDER_SITE_OTHER): Payer: Medicare Other | Admitting: Cardiovascular Disease

## 2018-09-05 ENCOUNTER — Encounter: Payer: Self-pay | Admitting: Cardiovascular Disease

## 2018-09-05 VITALS — Ht 65.0 in | Wt 197.0 lb

## 2018-09-05 DIAGNOSIS — E0590 Thyrotoxicosis, unspecified without thyrotoxic crisis or storm: Secondary | ICD-10-CM | POA: Diagnosis not present

## 2018-09-05 DIAGNOSIS — R002 Palpitations: Secondary | ICD-10-CM | POA: Diagnosis not present

## 2018-09-05 DIAGNOSIS — E559 Vitamin D deficiency, unspecified: Secondary | ICD-10-CM | POA: Diagnosis not present

## 2018-09-05 DIAGNOSIS — I4510 Unspecified right bundle-branch block: Secondary | ICD-10-CM

## 2018-09-05 DIAGNOSIS — I10 Essential (primary) hypertension: Secondary | ICD-10-CM | POA: Diagnosis not present

## 2018-09-05 DIAGNOSIS — I4891 Unspecified atrial fibrillation: Secondary | ICD-10-CM | POA: Diagnosis not present

## 2018-09-05 MED ORDER — METOPROLOL SUCCINATE ER 25 MG PO TB24: 25.0000 mg | ORAL_TABLET | Freq: Every day | ORAL | 1 refills | Status: DC

## 2018-09-05 MED FILL — LINZESS 145 MCG CAPSULE: 145 | 90 days supply | Qty: 90 | Fill #1

## 2018-09-05 MED FILL — METOPROLOL SUCCINATE ER 25: 25 | 90 days supply | Qty: 90 | Fill #0

## 2018-09-05 NOTE — Patient Instructions (Signed)
Medication Instructions:  Start Metoprolol XL 25 mg daily.  If you need a refill on your cardiac medications before your next appointment, please call your pharmacy.   Testing/Procedures: Echocardiogram - Your physician has requested that you have an echocardiogram. Echocardiography is a painless test that uses sound waves to create images of your heart. It provides your doctor with information about the size and shape of your heart and how well your heart's chambers and valves are working. This procedure takes approximately one hour. There are no restrictions for this procedure. This will be performed at our [facility] location - [address].  Follow-Up: At [facility], you and your health needs are our priority.  As part of our continuing mission to provide you with exceptional heart care, we have created designated Provider Care Teams.  These Care Teams include your primary Cardiologist (physician) and Advanced Practice Providers (APPs -  Physician Assistants and Nurse Practitioners) who all work together to provide you with the care you need, when you need it. You will need a follow up appointment in 4-6 weeks. You may see Dr.Kelly or one of the following Advanced Practice Providers on your designated Care Team: Azalee Course, [place] . Micah Flesher, PA-C  Any Other Special Instructions Will Be Listed Below (If Applicable). Monitor palpitations.

## 2018-09-05 NOTE — Progress Notes (Signed)
Virtual Visit via Video Note   This visit type was conducted due to national recommendations for restrictions regarding the COVID-19 Pandemic (e.g. social distancing) in an effort to limit this patient's exposure and mitigate transmission in our community.  Due to her co-morbid illnesses, this patient is at least at moderate risk for complications without adequate follow up.  This format is felt to be most appropriate for this patient at this time.  All issues noted in this document were discussed and addressed.  A limited physical exam was performed with this format.  Please refer to the patient's chart for her consent to telehealth for [facility].   Evaluation Performed:  Follow-up visit  Date:  09/05/2018   ID:  Kayla Preston, DOB 1950-10-24, MRN [id number]  Patient Location: Home Provider Location: Office  PCP:  Hoy RegisterNewlin, Enobong, MD, Georgian CoAngela McClung, PAC Cardiologist:  Bishop [place]bo Giulian Goldring, MD (new) Electrophysiologist:  None   Chief Complaint: New cardiology evaluation  History of Present Illness:    Kayla Preston is a 68 y.o. female [facility].  [name] states 2010, [facility] and underwent an atrial fibrillation ablation.  She had done very well with reference to this.  History of hypertension, And has been on amlodipine in addition to hydrochlorothiazide.  She also has GI issues which have improved with Linzess.  Recently, she has begun to notice episodic palpitations associated with night sweats.  Denies any associated chest pain.  She had undergone recent laboratory and was found to have an over suppressed thyroid TSH of 0.020.  He is scheduled to see an endocrinologist and recently was started on methimazole 5 mg.  Because of recent symptomatology, she is referred for cardiology evaluation.  She also has a history of vitamin D deficiency and is now on 50,000 units weekly.   Patient denies any significant  issues with sleep, denies awareness of any sleep apnea.  The patient does not have symptoms concerning for COVID-19 infection (fever, chills, cough, or new shortness of breath).    Past Medical History:  Diagnosis Date  . Atrial fibrillation (HCC)   . Atrial fibrillation (HCC)    she had ablation which resolved intermittent afib  . Hypertension   . Obesity   . Seasonal allergies    Past Surgical History:  Procedure Laterality Date  . ABLATION       Current Meds  Medication Sig  . amLODipine (NORVASC) 2.5 MG tablet Take 1 tablet (2.5 mg total) by mouth daily.  . Cyanocobalamin (VITAMIN B 12 PO) Take 1 tablet by mouth daily. Reported on 07/22/2015  . hydrochlorothiazide (HYDRODIURIL) 25 MG tablet Take 0.5 tablets (12.5 mg total) by mouth daily.  Marland Kitchen. LINZESS 145 MCG CAPS capsule Take 1 capsule by mouth daily.  . methimazole (TAPAZOLE) 5 MG tablet Take 1 tablet (5 mg total) by mouth daily.  . Vitamin D, Ergocalciferol, (DRISDOL) 1.25 MG (50000 UT) CAPS capsule Take 1 capsule (50,000 Units total) by mouth every 7 (seven) days.     Allergies:   Patient has no known allergies.   Social History   Tobacco Use  . Smoking status: Never Smoker  . Smokeless tobacco: Never Used  Substance Use Topics  . Alcohol use: No  . Drug use: No    Socially she is divorced and single.  She has 5 children and 5 grandchildren.  Family Hx: The patient's family history includes Heart disease in her father and mother; Hyperlipidemia  in her sister.   Her father died secondary to an MI 23s.  Her mother died with a stroke at age 45.  Has 1 deceased sister, 1 living brother and 1 living sister.  ROS:   Please see the history of present illness.     General: Negative; No fevers, chills, or night sweats;  HEENT: Negative; No changes in vision or hearing, sinus congestion, difficulty swallowing Positive for seasonal allergies Pulmonary: Negative; No cough, wheezing, shortness of breath, hemoptysis  Cardiovascular: See HPI GI: Negative; No nausea, vomiting, diarrhea, or abdominal pain GU: Recent UTI Musculoskeletal: Negative; no myalgias, joint pain, or weakness Hematologic/Oncology: Negative; no easy bruising, bleeding Endocrine: Negative; no heat/cold intolerance; no diabetes Neuro: Negative; no changes in balance, headaches Skin: Negative; No rashes or skin lesions Psychiatric: Negative; No behavioral problems, depression Sleep: Negative; No snoring, daytime sleepiness, hypersomnolence, bruxism, restless legs, hypnogognic hallucinations, no cataplexy Other comprehensive 14 point system review is negative. All other systems reviewed and are negative.    Labs/Other Tests and Data Reviewed:    EKG:  An ECG dated July 27, 2018 was personally reviewed today and demonstrated:  Normal sinus rhythm at 81 with sinus arrhythmia, right bundle branch block.  QTc interval 446 ms  Recent Labs: 03/06/2018: Hemoglobin 13.1; Platelets 225 07/27/2018: ALT 41; BUN 15; Creatinine, Ser 0.71; Potassium 4.2; Sodium 142; TSH 0.020   Recent Lipid Panel Lab Results  Component Value Date/Time   CHOL 201 (H) 12/21/2017 09:00 AM   TRIG 63 12/21/2017 09:00 AM   HDL 59 12/21/2017 09:00 AM   CHOLHDL 3.4 12/21/2017 09:00 AM   CHOLHDL 3.4 05/17/2016 10:04 AM   LDLCALC 129 (H) 12/21/2017 09:00 AM    Wt Readings from Last 3 Encounters:  09/05/18 197 lb (89.4 kg)  07/27/18 202 lb 9.6 oz (91.9 kg)  03/13/18 204 lb (92.5 kg)     Objective:    Vital Signs:  Ht 5\' 5"  (1.651 m)   Wt 197 lb (89.4 kg)   BMI 32.78 kg/m    The patient did not have a blood pressure reading at home.  However her blood pressure on March 19 was 123/78 with a pulse of 88  She is well-developed and well-nourished in no acute distress.   mildly obese HEENT is unremarkable.  Did not appear to have lid lag. There is no apparent JVD There is no audible wheezing Her pulse today is regular, She is unaware of any recurrent AF  since her ablation Denied any abdominal tenderness chest tenderness to pressure She was unaware of any leg edema Neurologically she appeared intact She had normal affect, mood and cognition   ASSESSMENT & PLAN:    1. Essential hypertension: She has been on amlodipine in addition to HCTZ.  She is found to have over suppressed thyroid which may be contributing to her arrhythmia and palpitations.  I am electing to add Toprol-XL initially at 25 mg to her medical regimen which will be helpful both for blood pressure control as well as palpitations. 2. Hyperhyroidism: Most recent TSH 0.020 c/w an over suppressed.  She was recently started on methimazole 5 mg.  I suspect this may be contributing to her palpitations and sinus arrhythmia. I agree with endocrinologic referral. 3. Palpitations: Her ECG shows sinus rhythm at 81 with RBBB and sinus arrhythmia.  The patient also admits to episodic palpitations as well associated with some night sweats particularly when she turns on her right side.  She denies any symptoms suggestive of  significant sleep apnea.  Presently I will add Toprol-XL 25 mg daily and if these continue further titration to 50 mg will be done.  I am scheduling her for 2D echo Doppler study to evaluate both systolic and diastolic function.  I will also schedule her to wear a 2-week event monitor.  I reviewed recent laboratory. 4. RBBB 5. Vitamin D deficiency: Currently on supplemental vitamin D 50,000 units weekly  COVID-19 Education: The signs and symptoms of COVID-19 were discussed with the patient and how to seek care for testing (follow up with PCP or arrange E-visit).  The importance of social distancing was discussed today.  Time:   Today, I have spent 30 minutes with the patient with telehealth technology discussing the above problems.     Medication Adjustments/Labs and Tests Ordered: Current medicines are reviewed at length with the patient today.  Concerns regarding medicines  are outlined above.   Tests Ordered: No orders of the defined types were placed in this encounter.   Medication Changes: No orders of the defined types were placed in this encounter.   Disposition:  Follow up 6 weeks  Signed, Nicki Guadalajara, MD  09/05/2018 8:47 AM    [facility]

## 2018-09-13 ENCOUNTER — Ambulatory Visit (INDEPENDENT_AMBULATORY_CARE_PROVIDER_SITE_OTHER): Payer: Medicare Other | Admitting: Endocrinology

## 2018-09-13 ENCOUNTER — Encounter: Payer: Self-pay | Admitting: Endocrinology

## 2018-09-13 DIAGNOSIS — E0590 Thyrotoxicosis, unspecified without thyrotoxic crisis or storm: Secondary | ICD-10-CM

## 2018-09-13 NOTE — Progress Notes (Signed)
Subjective:    Patient ID: Kayla Preston, female    DOB: July 10, 1950, 68 y.o.   MRN: [id number]  HPI  telehealth visit today via doxy video visit.  Alternatives to telehealth are presented to this patient, and the patient agrees to the telehealth visit. Pt is advised of the cost of the visit, and agrees to this, also.   Patient is at home, and I am at the office.   Persons attending the telehealth visit: the patient and I.  Pt is referred by Georgian Co, PA, for hyperthyroidism.  Pt reports he was dx'ed with hyperthyroidism in 3/19.  she has never had XRT to the anterior neck, or thyroid surgery.  she has never had thyroid imaging.  she does not consume kelp or any other non-prescribed thyroid medication.  she has never been on amiodarone.  She has slight palpitations in the chest, and assoc low-back pain.  She was rx'ed tapazole.  Since then, sxs are unchanged.   Past Medical History:  Diagnosis Date  . Atrial fibrillation (HCC)   . Atrial fibrillation (HCC)    she had ablation which resolved intermittent afib  . Hypertension   . Obesity   . Seasonal allergies     Past Surgical History:  Procedure Laterality Date  . ABLATION      Social History   Socioeconomic History  . Marital status: Single    Spouse name: Not on file  . Number of children: Not on file  . Years of education: Not on file  . Highest education level: Not on file  Occupational History  . Not on file  Social Needs  . Financial resource strain: Not on file  . Food insecurity:    Worry: Not on file    Inability: Not on file  . Transportation needs:    Medical: Not on file    Non-medical: Not on file  Tobacco Use  . Smoking status: Never Smoker  . Smokeless tobacco: Never Used  Substance and Sexual Activity  . Alcohol use: No  . Drug use: No  . Sexual activity: Not Currently  Lifestyle  . Physical activity:    Days per week: Not on file    Minutes per session: Not on file  . Stress: Not on  file  Relationships  . Social connections:    Talks on phone: Not on file    Gets together: Not on file    Attends religious service: Not on file    Active member of club or organization: Not on file    Attends meetings of clubs or organizations: Not on file    Relationship status: Not on file  . Intimate partner violence:    Fear of current or ex partner: Not on file    Emotionally abused: Not on file    Physically abused: Not on file    Forced sexual activity: Not on file  Other Topics Concern  . Not on file  Social History Narrative  . Not on file    Current Outpatient Medications on File Prior to Visit  Medication Sig Dispense Refill  . amLODipine (NORVASC) 2.5 MG tablet Take 1 tablet (2.5 mg total) by mouth daily. 30 tablet 6  . aspirin EC 81 MG tablet Take 81 mg by mouth daily.    . Cyanocobalamin (VITAMIN B 12 PO) Take 1 tablet by mouth daily. Reported on 07/22/2015    . hydrochlorothiazide (HYDRODIURIL) 25 MG tablet Take 0.5 tablets (12.5 mg total) by mouth daily.  30 tablet 6  . LINZESS 145 MCG CAPS capsule Take 1 capsule by mouth daily.  2  . methimazole (TAPAZOLE) 5 MG tablet Take 1 tablet (5 mg total) by mouth daily. 30 tablet 3  . metoprolol succinate (TOPROL XL) 25 MG 24 hr tablet Take 1 tablet (25 mg total) by mouth daily. 90 tablet 1  . Vitamin D, Ergocalciferol, (DRISDOL) 1.25 MG (50000 UT) CAPS capsule Take 1 capsule (50,000 Units total) by mouth every 7 (seven) days. 16 capsule 0   No current facility-administered medications on file prior to visit.     No Known Allergies  Family History  Problem Relation Age of Onset  . Heart disease Mother   . Heart disease Father   . Hyperlipidemia Sister   . Thyroid disease Neg Hx      Review of Systems denies headache, hoarseness, diplopia, chest pain, sob, diarrhea, polyuria, muscle weakness, edema, excessive diaphoresis, tremor, anxiety, heat intolerance, easy bruising, and rhinorrhea. She has weight gain.        Objective:   Physical Exam    Lab Results  Component Value Date   TSH 0.020 (L) 07/27/2018   I have reviewed outside records, and summarized: Pt was noted to have suppressed TSH, and referred here.       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   SVT, new to me: in this setting, she needs normalization of even mildly abnormal TFT.  Patient Instructions  Blood tests are requested for you today.  We'll let you know about the results.  If ever you have fever while taking methimazole, stop it and call us, even if the reason is obvious, because of the risk of a rare side-effect. Please come back for a follow-up appointment in 2 months.

## 2018-09-13 NOTE — Patient Instructions (Signed)
Blood tests are requested for you today.  We'll let you know about the results.  If ever you have fever while taking methimazole, stop it and call us, even if the reason is obvious, because of the risk of a rare side-effect. Please come back for a follow-up appointment in 2 months.  

## 2018-09-19 ENCOUNTER — Other Ambulatory Visit: Payer: Self-pay

## 2018-09-19 ENCOUNTER — Other Ambulatory Visit (INDEPENDENT_AMBULATORY_CARE_PROVIDER_SITE_OTHER): Payer: Medicare Other

## 2018-09-19 ENCOUNTER — Encounter: Payer: Self-pay | Admitting: Family Medicine

## 2018-09-19 ENCOUNTER — Ambulatory Visit: Payer: Medicare Other | Attending: Family Medicine | Admitting: Family Medicine

## 2018-09-19 DIAGNOSIS — E0590 Thyrotoxicosis, unspecified without thyrotoxic crisis or storm: Secondary | ICD-10-CM | POA: Diagnosis not present

## 2018-09-19 DIAGNOSIS — E559 Vitamin D deficiency, unspecified: Secondary | ICD-10-CM

## 2018-09-19 DIAGNOSIS — I10 Essential (primary) hypertension: Secondary | ICD-10-CM

## 2018-09-19 LAB — T4, FREE: Free T4: 0.79 ng/dL (ref 0.60–1.60)

## 2018-09-19 LAB — TSH: TSH: 1.13 u[IU]/mL (ref 0.35–4.50)

## 2018-09-19 MED ORDER — HYDROCHLOROTHIAZIDE 25 MG PO TABS: 12.5000 mg | ORAL_TABLET | Freq: Every day | ORAL | 1 refills | Status: DC

## 2018-09-19 MED FILL — HYDROCHLOROTHIAZIDE 25 MG T: 25 | 90 days supply | Qty: 90 | Fill #0

## 2018-09-19 NOTE — Progress Notes (Signed)
Virtual Visit via Video Note  I connected with Kayla Preston, on 09/19/2018 at 8:58 AM by video enabled telemedicine device due to the COVID-19 pandemic and verified that I am speaking with the correct person using two identifiers.   Consent: I discussed the limitations, risks, security and privacy concerns of performing an evaluation and management service by telemedicine and the availability of in person appointments. I also discussed with the patient that there may be a patient responsible charge related to this service. The patient expressed understanding and agreed to proceed.   Location of Patient: Home  Location of Provider: Clinic   Persons participating in Telemedicine visit: Kayla Preston Kayla Preston-CMA Dr. Nelwyn SalisburyNewlin-PCP    History of Present Illness: Kayla BradleyDoris Preston Kayla Preston  is a 68 year old female with a history of hypertension, [name] She reports doing well and denies chest pain, dyspnea. Seen by endocrine for hypothyroidism, commenced on methimazole and referred to cardiology due to palpitations in the setting of SVT. Cardiology visit she was commenced on metoprolol and amlodipine discontinued.  She denies palpitations at this time and is scheduled for repeat thyroid blood test today with endocrine. She has no additional concerns today.    Past Medical History:  Diagnosis Date  . Atrial fibrillation (HCC)   . Atrial fibrillation (HCC)    she had ablation which resolved intermittent afib  . Hypertension   . Obesity   . Seasonal allergies    No Known Allergies  Current Outpatient Medications on File Prior to Visit  Medication Sig Dispense Refill  . aspirin EC 81 MG tablet Take 81 mg by mouth daily.    . Cyanocobalamin (VITAMIN B 12 PO) Take 1 tablet by mouth daily. Reported on 07/22/2015    . hydrochlorothiazide (HYDRODIURIL) 25 MG tablet Take 0.5 tablets (12.5 mg total) by mouth daily. 30 tablet 6  .  LINZESS 145 MCG CAPS capsule Take 1 capsule by mouth daily.  2  . methimazole (TAPAZOLE) 5 MG tablet Take 1 tablet (5 mg total) by mouth daily. 30 tablet 3  . metoprolol succinate (TOPROL XL) 25 MG 24 hr tablet Take 1 tablet (25 mg total) by mouth daily. 90 tablet 1  . Vitamin D, Ergocalciferol, (DRISDOL) 1.25 MG (50000 UT) CAPS capsule Take 1 capsule (50,000 Units total) by mouth every 7 (seven) days. 16 capsule 0  . amLODipine (NORVASC) 2.5 MG tablet Take 1 tablet (2.5 mg total) by mouth daily. (Patient not taking: Reported on 09/19/2018) 30 tablet 6   No current facility-administered medications on file prior to visit.     Observations/Objective: Awake, alert, oriented x3 Not in acute distress   CMP Latest Ref Rng & Units 07/27/2018 12/12/2017 07/02/2017  Glucose 65 - 99 mg/dL 83 90 161(W110(H)  BUN 8 - 27 mg/dL 15 17 11   Creatinine 0.57 - 1.00 mg/dL 9.600.71 4.540.80 0.980.80  Sodium 134 - 144 mmol/L 142 140 139  Potassium 3.5 - 5.2 mmol/L 4.2 4.0 4.1  Chloride 96 - 106 mmol/L 104 106 106  CO2 20 - 29 mmol/L 19(L) 18(L) 23  Calcium 8.7 - 10.3 mg/dL 9.7 9.6 9.4  Total Protein 6.0 - 8.5 g/dL 6.8 6.9 7.5  Total Bilirubin 0.0 - 1.2 mg/dL 0.3 0.3 0.5  Alkaline Phos 39 - 117 IU/L 77 83 73  AST 0 - 40 IU/L 23 21 32  ALT 0 - 32 IU/L 41(H) 23 27    Lab Results  Component Value Date  TSH 0.020 (L) 07/27/2018     Assessment and Plan: 1. Essential hypertension Stable Counseled on blood pressure goal of less than 130/80, low-sodium, DASH diet, medication compliance, 150 minutes of moderate intensity exercise per week. Discussed medication compliance, adverse effects. Counseled on blood pressure goal of less than 130/80, low-sodium, DASH diet, medication compliance, 150 minutes of moderate intensity exercise per week. Discussed medication compliance, adverse effects. - hydrochlorothiazide (HYDRODIURIL) 25 MG tablet; Take 0.5 tablets (12.5 mg total) by mouth daily.  Dispense: 90 tablet; Refill: 1  2.  Hyperthyroidism Suppressed TSH as last visit Continue methimazole Followed by endocrine  3. Vitamin D deficiency Currently on Drisdol   Follow Up Instructions: Return in about 3 months (around 12/20/2018).    I discussed the assessment and treatment plan with the patient. The patient was provided an opportunity to ask questions and all were answered. The patient agreed with the plan and demonstrated an understanding of the instructions.   The patient was advised to call back or seek an in-person evaluation if the symptoms worsen or if the condition fails to improve as anticipated.     I provided 15 minutes total of Telehealth time during this encounter including median intraservice time, reviewing previous notes, labs, imaging, medications and explaining diagnosis and management.     Hoy Register, MD, FAAFP. [facility] [place], [place] [phone]   09/19/2018, 8:58 AM

## 2018-09-19 NOTE — Progress Notes (Signed)
Patient has been called and DOB has been verified. Patient has been screened and transferred to PCP to start phone visit.  Patient is having pain in left arm. Patient is having pain in right elbow.

## 2018-09-25 DIAGNOSIS — R140 Abdominal distension (gaseous): Secondary | ICD-10-CM | POA: Diagnosis not present

## 2018-09-25 DIAGNOSIS — Z86010 Personal history of colonic polyps: Secondary | ICD-10-CM | POA: Diagnosis not present

## 2018-09-25 DIAGNOSIS — K5909 Other constipation: Secondary | ICD-10-CM | POA: Diagnosis not present

## 2018-09-26 MED FILL — methIMAzole 5 MG TABS: 5 | 30 days supply | Qty: 30 | Fill #1

## 2018-09-28 ENCOUNTER — Telehealth (HOSPITAL_COMMUNITY): Payer: Self-pay

## 2018-09-28 NOTE — Telephone Encounter (Signed)
LMTCB to schedule echo

## 2018-10-10 ENCOUNTER — Telehealth (HOSPITAL_COMMUNITY): Payer: Self-pay | Admitting: Cardiology

## 2018-10-10 NOTE — Telephone Encounter (Signed)
No answer. No message left.

## 2018-10-11 ENCOUNTER — Ambulatory Visit (HOSPITAL_COMMUNITY): Payer: Medicare Other | Attending: Cardiology

## 2018-10-11 ENCOUNTER — Other Ambulatory Visit: Payer: Self-pay

## 2018-10-11 DIAGNOSIS — Z8249 Family history of ischemic heart disease and other diseases of the circulatory system: Secondary | ICD-10-CM | POA: Diagnosis not present

## 2018-10-11 DIAGNOSIS — I77810 Thoracic aortic ectasia: Secondary | ICD-10-CM | POA: Insufficient documentation

## 2018-10-11 DIAGNOSIS — R002 Palpitations: Secondary | ICD-10-CM | POA: Insufficient documentation

## 2018-10-11 DIAGNOSIS — I10 Essential (primary) hypertension: Secondary | ICD-10-CM | POA: Diagnosis not present

## 2018-10-11 DIAGNOSIS — I4891 Unspecified atrial fibrillation: Secondary | ICD-10-CM | POA: Insufficient documentation

## 2018-10-11 DIAGNOSIS — E669 Obesity, unspecified: Secondary | ICD-10-CM | POA: Diagnosis not present

## 2018-10-23 ENCOUNTER — Telehealth: Payer: Self-pay | Admitting: Cardiovascular Disease

## 2018-10-23 NOTE — Telephone Encounter (Signed)
New Message ° ° ° °Pt is returning call  ° ° ° °Please call back  °

## 2018-10-24 ENCOUNTER — Telehealth: Payer: Self-pay | Admitting: Cardiovascular Disease

## 2018-10-25 MED FILL — VIT D2 1.25 MG (50,000 UNIT: 1.25 MG | 27 days supply | Qty: 4 | Fill #1

## 2018-10-25 MED FILL — methIMAzole 5 MG TABS: 5 | 30 days supply | Qty: 30 | Fill #2

## 2018-10-26 ENCOUNTER — Telehealth (INDEPENDENT_AMBULATORY_CARE_PROVIDER_SITE_OTHER): Payer: Medicare Other | Admitting: Cardiovascular Disease

## 2018-10-26 VITALS — BP 140/76 | Ht 66.0 in | Wt 198.0 lb

## 2018-10-26 DIAGNOSIS — I10 Essential (primary) hypertension: Secondary | ICD-10-CM

## 2018-10-26 DIAGNOSIS — E559 Vitamin D deficiency, unspecified: Secondary | ICD-10-CM

## 2018-10-26 DIAGNOSIS — I4510 Unspecified right bundle-branch block: Secondary | ICD-10-CM

## 2018-10-26 DIAGNOSIS — E0590 Thyrotoxicosis, unspecified without thyrotoxic crisis or storm: Secondary | ICD-10-CM | POA: Diagnosis not present

## 2018-10-26 DIAGNOSIS — I519 Heart disease, unspecified: Secondary | ICD-10-CM | POA: Diagnosis not present

## 2018-10-26 DIAGNOSIS — R002 Palpitations: Secondary | ICD-10-CM | POA: Diagnosis not present

## 2018-10-26 DIAGNOSIS — I5189 Other ill-defined heart diseases: Secondary | ICD-10-CM

## 2018-10-26 NOTE — Patient Instructions (Signed)

## 2018-10-26 NOTE — Progress Notes (Signed)
Virtual Visit via Telephone Note   This visit type was conducted due to national recommendations for restrictions regarding the COVID-19 Pandemic (e.g. social distancing) in an effort to limit this patient's exposure and mitigate transmission in our community.  Due to her co-morbid illnesses, this patient is at least at moderate risk for complications without adequate follow up.  This format is felt to be most appropriate for this patient at this time.  The patient did not have access to video technology/had technical difficulties with video requiring transitioning to audio format only (telephone).  All issues noted in this document were discussed and addressed.  No physical exam could be performed with this format.  Please refer to the patient's chart for her  consent to telehealth [facility].   Date:  10/26/2018   ID:  Kayla Preston, DOB 16-Feb-1951, MRN [id number]  Patient Location: Home Provider Location: Home  PCP:  Kayla Preston, Enobong, MD  Cardiologist:  Kayla Guadalajarahomas Kimberlly Norgard, MD Electrophysiologist:  None   Evaluation Performed:  Follow-Up Visit  Chief Complaint: Follow-up evaluation of palpitations  History of Present Illness:    Kayla Preston is a 68 y.o. female [facility].  She was evaluated initially on September 05, 2018 and a telemedicine visit.  She presents for follow-up evaluation.  Kayla Preston states 2010, [facility] and underwent an atrial fibrillation ablation. She had done very well with reference to this.  She has a history of hypertension and has been on amlodipine in addition to hydrochlorothiazide.  She also has GI issues which have improved with Linzess.  Recently, she has begun to notice episodic palpitations associated with night sweats.  She denied any associated chest pain.  She had undergone recent laboratory and was found to have an over suppressed thyroid TSH of 0.020.  She was scheduled to see  an endocrinologist and recently was started on methimazole 5 mg.  She was subsequently evaluated by Dr. Romero BellingSean Preston.  Subsequent TSH and free T4 were normal and she was advised to continue her same medication.  She also has a history of vitamin D deficiency and was on 50,000 units weekly.   When I initially saw her, she was on amlodipine and HCTZ for hypertension.  She was found to have over suppressed thyroid which may be contributing to her arrhythmia and palpitations elected to add Toprol-XL initially at 25 mg.  I recommended that she undergo a 2D echo Doppler study which was done on October 11, 2018 and showed normal LV function with mild LVH and mild diastolic dysfunction.  There is very mild dilation of her aortic root at 39 mm.  Over the past several months, she has noted significant benefit since the initiation of Toprol-XL.  She states her blood pressure typically runs in the 130 systolic range.  She denies any recurrent palpitations.  She denies presyncope or syncope or chest pain.  She is sleeping well but often wakes up when neighbors in a nearby apartment seem to smoke in the early morning hours with weed and other compounds  which wakes her up and contributes to congestion.   The patient does not have symptoms concerning for COVID-19 infection (fever, chills, cough, or new shortness of breath).    Past Medical History:  Diagnosis Date   Atrial fibrillation [facility](HCC)    Atrial fibrillation [facility](HCC)    she had ablation which resolved intermittent afib   Hypertension    Obesity  Seasonal allergies    Past Surgical History:  Procedure Laterality Date   ABLATION       Current Meds  Medication Sig   aspirin EC 81 MG tablet Take 81 mg by mouth daily.   Cyanocobalamin (VITAMIN B 12 PO) Take 1 tablet by mouth daily. Reported on 07/22/2015   hydrochlorothiazide (HYDRODIURIL) 25 MG tablet Take 0.5 tablets (12.5 mg total) by mouth daily.   LINZESS 145 MCG CAPS capsule Take 1 capsule  by mouth daily.   methimazole (TAPAZOLE) 5 MG tablet Take 1 tablet (5 mg total) by mouth daily.   metoprolol succinate (TOPROL XL) 25 MG 24 hr tablet Take 1 tablet (25 mg total) by mouth daily.   Vitamin D, Ergocalciferol, (DRISDOL) 1.25 MG (50000 UT) CAPS capsule Take 1 capsule (50,000 Units total) by mouth every 7 (seven) days.     Allergies:   Patient has no known allergies.   Social History   Tobacco Use   Smoking status: Never Smoker   Smokeless tobacco: Never Used  Substance Use Topics   Alcohol use: No   Drug use: No     Family Hx: The patient's family history includes Heart disease in her father and mother; Hyperlipidemia in her sister. There is no history of Thyroid disease.  ROS:   Please see the history of present illness.    She denies any fevers chills night sweats No cough No wheezing No chest pain, palpitations have resolved with beta-blocker therapy in addition to her with Tapazole No diarrhea nausea vomiting No edema No neurologic issues Sleeping well   All other systems reviewed and are negative.   Prior CV studies:   The following studies were reviewed today:  ECHO 10/11/2018 IMPRESSIONS  1. The left ventricle has normal systolic function, with an ejection fraction of 55-60%. The cavity size was normal. There is mildly increased left ventricular wall thickness. Left ventricular diastolic Doppler parameters are consistent with impaired  relaxation. No evidence of left ventricular regional wall motion abnormalities.  2. The right ventricle has normal systolic function. The cavity was normal. There is no increase in right ventricular wall thickness.  3. There is mild mitral annular calcification present. Trivial mitral regurgitation.  4. The aortic valve is tricuspid. No stenosis of the aortic valve.  5. There is mild dilatation of the aortic root measuring 39 mm.  6. Normal IVC size. PA systolic pressure 21 mmHg.   Labs/Other Tests and Data  Reviewed:    EKG:  An ECG dated 07/27/2018 was personally reviewed today and demonstrated:  Normal sinus rhythm at 81 with mild sinus arrhythmia, right bundle branch block.  Normal QT interval at 446  Recent Labs: 03/06/2018: Hemoglobin 13.1; Platelets 225 07/27/2018: ALT 41; BUN 15; Creatinine, Ser 0.71; Potassium 4.2; Sodium 142 09/19/2018: TSH 1.13   Recent Lipid Panel Lab Results  Component Value Date/Time   CHOL 201 (H) 12/21/2017 09:00 AM   TRIG 63 12/21/2017 09:00 AM   HDL 59 12/21/2017 09:00 AM   CHOLHDL 3.4 12/21/2017 09:00 AM   CHOLHDL 3.4 05/17/2016 10:04 AM   LDLCALC 129 (H) 12/21/2017 09:00 AM    Wt Readings from Last 3 Encounters:  10/26/18 198 lb (89.8 kg)  09/05/18 197 lb (89.4 kg)  07/27/18 202 lb 9.6 oz (91.9 kg)     Objective:    Vital Signs:  BP 140/76    Ht 5\' 6"  (1.676 m)    Wt 198 lb (89.8 kg)    BMI 31.96 kg/m  She states typical blood pressures often run in the 132/70 range.  This was a phone visit.  I could not see the patient. Breathing was normal and not labored There was no wheezing Her pulse was regular and she did not detect any rhythm abnormality She did not have any chest wall tenderness to palpation Her abdomen was nontender to palpation There was no edema She denied neurologic issues She had normal cognition mood and affect   ASSESSMENT & PLAN:    1. Essential hypertension: When I saw her for initial evaluation Toprol-XL 25 mg was added to her current dose of amlodipine and HCTZ.  Her blood pressures have improved and her palpitations have resolved.  We discussed optimal blood pressure less than 120/80 with stage I hypertension beginning at 130/80.  She will continue to monitor blood pressure.  We discussed sodium restriction.  I reviewed her echo Doppler study which shows normal systolic function with mild LVH. 2. Palpitations: Resolved with addition of low-dose Toprol-XL 25 mg daily 3. Grade 1 diastolic dysfunction: Most likely  contributed by hypertension.  Currently stable without dyspnea. 4. Hypothyroidism: TSH initially was over suppressed at 0.02 for which she was started on methimazole 5 mg.  Most recent laboratory studies are normal.  She has seen Dr. Romero BellingSean Preston for endocrinologic evaluation. 5. History of right bundle branch block: Could not reassess today 6. History of vitamin D deficiency treated with vitamin D  COVID-19 Education: The signs and symptoms of COVID-19 were discussed with the patient and how to seek care for testing (follow up with PCP or arrange E-visit).  The importance of social distancing was discussed today.  Time:   Today, I have spent 16 minutes with the patient with telehealth technology discussing the above problems.     Medication Adjustments/Labs and Tests Ordered: Current medicines are reviewed at length with the patient today.  Concerns regarding medicines are outlined above.   Tests Ordered: No orders of the defined types were placed in this encounter.   Medication Changes: No orders of the defined types were placed in this encounter.   Follow Up: 6266-month office evaluation  Signed, Kayla Guadalajarahomas Jolyne Laye, MD  10/26/2018 3:50 PM    [facility]

## 2018-11-07 NOTE — Telephone Encounter (Signed)
Opened in error

## 2018-11-14 ENCOUNTER — Telehealth: Payer: Self-pay

## 2018-11-14 NOTE — Telephone Encounter (Signed)
Called patient, she states that she did speak with the manager at her apartment- and they are faxing over a page for him to sign due to her health issues of why she can no longer live where she is and needs to move to a different place.   Patient advised that Dr.Kelly is out until next week-  Patient verbalized understanding.

## 2018-11-21 ENCOUNTER — Telehealth: Payer: Self-pay

## 2018-11-21 NOTE — Telephone Encounter (Signed)
Error

## 2018-11-21 NOTE — Telephone Encounter (Signed)
Not needed

## 2018-11-21 NOTE — Telephone Encounter (Signed)
error 

## 2018-11-22 NOTE — Telephone Encounter (Signed)
Information was signed and faxed back into the patients living facility.

## 2018-11-23 MED FILL — methIMAzole 5 MG TABS: 5 | 30 days supply | Qty: 30 | Fill #3

## 2018-12-19 MED FILL — LINZESS 145 MCG CAPSULE: 145 | 30 days supply | Qty: 30 | Fill #0

## 2019-01-01 MED FILL — HYDROCHLOROTHIAZIDE 25 MG T: 25 | 30 days supply | Qty: 15 | Fill #1

## 2019-01-01 MED FILL — METOPROLOL SUCCINATE ER 25: 25 | 30 days supply | Qty: 30 | Fill #1

## 2019-01-08 ENCOUNTER — Ambulatory Visit: Payer: Medicaid Other | Admitting: Family Medicine

## 2019-02-01 MED FILL — HYDROCHLOROTHIAZIDE 25 MG T: 25 | 30 days supply | Qty: 15 | Fill #2

## 2019-02-01 MED FILL — LINZESS 145 MCG CAPSULE: 145 | 90 days supply | Qty: 90 | Fill #1

## 2019-02-01 MED FILL — METOPROLOL SUCCINATE ER 25: 25 | 30 days supply | Qty: 30 | Fill #2

## 2019-02-06 ENCOUNTER — Other Ambulatory Visit: Payer: Self-pay

## 2019-02-06 ENCOUNTER — Ambulatory Visit: Payer: Medicare Other | Attending: Family Medicine | Admitting: Pharmacist

## 2019-02-06 DIAGNOSIS — Z23 Encounter for immunization: Secondary | ICD-10-CM | POA: Diagnosis not present

## 2019-02-06 NOTE — Progress Notes (Signed)
Patient presents for vaccination against influenza per orders of Dr. Newlin. Consent given. Counseling provided. No contraindications exists. Vaccine administered without incident.   

## 2019-02-14 ENCOUNTER — Ambulatory Visit: Payer: Medicare Other | Attending: Family Medicine | Admitting: Family Medicine

## 2019-02-14 ENCOUNTER — Other Ambulatory Visit: Payer: Self-pay

## 2019-02-26 ENCOUNTER — Other Ambulatory Visit: Payer: Self-pay | Admitting: Physician Assistant

## 2019-02-26 ENCOUNTER — Other Ambulatory Visit: Payer: Self-pay | Admitting: Family Medicine

## 2019-02-26 MED FILL — methIMAzole 5 MG TABS: 5 | 30 days supply | Qty: 30 | Fill #0

## 2019-02-26 NOTE — Telephone Encounter (Signed)
Completed.

## 2019-02-26 NOTE — Telephone Encounter (Signed)
1) Medication(s) Requested (by name): -methimazole (TAPAZOLE) 5 MG tablet   2) Pharmacy of Choice: -[facility]  3) Special Requests: -Until appt date 03/05/2019

## 2019-03-05 ENCOUNTER — Ambulatory Visit: Payer: Medicare Other | Attending: Family Medicine | Admitting: Family Medicine

## 2019-03-05 ENCOUNTER — Other Ambulatory Visit: Payer: Self-pay

## 2019-03-12 ENCOUNTER — Ambulatory Visit: Payer: Medicare Other | Attending: Family Medicine | Admitting: Family Medicine

## 2019-03-12 ENCOUNTER — Other Ambulatory Visit: Payer: Self-pay

## 2019-03-12 DIAGNOSIS — I10 Essential (primary) hypertension: Secondary | ICD-10-CM | POA: Diagnosis not present

## 2019-03-12 DIAGNOSIS — E559 Vitamin D deficiency, unspecified: Secondary | ICD-10-CM

## 2019-03-12 DIAGNOSIS — E2839 Other primary ovarian failure: Secondary | ICD-10-CM | POA: Diagnosis not present

## 2019-03-12 DIAGNOSIS — E0590 Thyrotoxicosis, unspecified without thyrotoxic crisis or storm: Secondary | ICD-10-CM | POA: Diagnosis not present

## 2019-03-12 MED ORDER — HYDROCHLOROTHIAZIDE 25 MG PO TABS: 12.5000 mg | ORAL_TABLET | Freq: Every day | ORAL | 1 refills | Status: DC

## 2019-03-12 MED ORDER — MISC. DEVICES MISC: 0 refills | Status: DC

## 2019-03-12 MED FILL — HYDROCHLOROTHIAZIDE 25 MG T: 25 | 90 days supply | Qty: 45 | Fill #0

## 2019-03-12 NOTE — Progress Notes (Signed)
Virtual Visit via Telephone Note  I connected with Kayla Preston, on 03/12/2019 at 3:12 PM by telephone due to the COVID-19 pandemic and verified that I am speaking with the correct person using two identifiers.   Consent: I discussed the limitations, risks, security and privacy concerns of performing an evaluation and management service by telephone and the availability of in person appointments. I also discussed with the patient that there may be a patient responsible charge related to this service. The patient expressed understanding and agreed to proceed.   Location of Patient: Home  Location of Provider: Clinic   Persons participating in Telemedicine visit: Danniela Claretta Fraise Farrington-CMA Dr. Felecia Shelling     History of Present Illness: Zaray Gatchel  is a 68 year old female with a history of hypertension, hyperthyroidism [name]  She has been exercising regularly and has been compliant with her antihypertensive. Unable to check her blood pressure at home.  She has been off Tapazole when she was informed her thyroid labs were good but states she did not feel well and she subsequently resumed it and has since been compliant with Tapazole Hyperthyroidism wi being followed by endocrine however she informs me she was advised to continue follow-up with her PCP going forward complete review of her notes indicate her last visit with endocrine was in 09/2018 and there was no mention of such. Last seen by Dr. Claiborne Billings of cardiology for follow-up and no regimen changes made. She will be seeing the Dentist next month. She has no additional concerns today.  Past Medical History:  Diagnosis Date  . Atrial fibrillation ([place])   . Atrial fibrillation ([place])    she had ablation which resolved intermittent afib  . Hypertension   . Obesity   . Seasonal allergies    No Known Allergies  Current Outpatient Medications on File Prior to Visit   Medication Sig Dispense Refill  . aspirin EC 81 MG tablet Take 81 mg by mouth daily.    . Cyanocobalamin (VITAMIN B 12 PO) Take 1 tablet by mouth daily. Reported on 07/22/2015    . hydrochlorothiazide (HYDRODIURIL) 25 MG tablet Take 0.5 tablets (12.5 mg total) by mouth daily. 90 tablet 1  . LINZESS 145 MCG CAPS capsule Take 1 capsule by mouth daily.  2  . methimazole (TAPAZOLE) 5 MG tablet TAKE 1 TABLET (5 MG TOTAL) BY MOUTH DAILY. 30 tablet 3  . metoprolol succinate (TOPROL XL) 25 MG 24 hr tablet Take 1 tablet (25 mg total) by mouth daily. 90 tablet 1  . Vitamin D, Ergocalciferol, (DRISDOL) 1.25 MG (50000 UT) CAPS capsule Take 1 capsule (50,000 Units total) by mouth every 7 (seven) days. (Patient not taking: Reported on 03/12/2019) 16 capsule 0   No current facility-administered medications on file prior to visit.     Observations/Objective: Awake, alert, oriented x3 Not in acute distress  CMP Latest Ref Rng & Units 07/27/2018 12/12/2017 07/02/2017  Glucose 65 - 99 mg/dL 83 90 110(H)  BUN 8 - 27 mg/dL '15 17 11  '$ Creatinine 0.57 - 1.00 mg/dL 0.71 0.80 0.80  Sodium 134 - 144 mmol/L 142 140 139  Potassium 3.5 - 5.2 mmol/L 4.2 4.0 4.1  Chloride 96 - 106 mmol/L 104 106 106  CO2 20 - 29 mmol/L 19(L) 18(L) 23  Calcium 8.7 - 10.3 mg/dL 9.7 9.6 9.4  Total Protein 6.0 - 8.5 g/dL 6.8 6.9 7.5  Total Bilirubin 0.0 - 1.2 mg/dL 0.3 0.3 0.5  Alkaline Phos 39 - 117 IU/L 77 83 73  AST 0 - 40 IU/L 23 21 32  ALT 0 - 32 IU/L 41(H) 23 27    Lab Results  Component Value Date   TSH 1.13 09/19/2018     Assessment and Plan: 1. Essential hypertension Controlled We will prescribe blood pressure monitor so she can keep blood pressure log at home - hydrochlorothiazide (HYDRODIURIL) 25 MG tablet; Take 0.5 tablets (12.5 mg total) by mouth daily.  Dispense: 90 tablet; Refill: 1 - Misc. Devices MISC; Blood Pressure monitor. Dx: Hypertension  Dispense: 1 each; Refill: 0 - CMP14+EGFR; Future - Lipid panel;  Future  2. Estrogen deficiency - DG Bone Density; Future  3. Hyperthyroidism Controlled Continue Tapazole - T4, free; Future - TSH; Future  4. Vitamin D deficiency Previous deficiency Completed replacement with vitamin D - VITAMIN D 25 Hydroxy (Vit-D Deficiency, Fractures); Future   Follow Up Instructions: Return in about 6 months (around 09/09/2019) for Chronic medical conditions. Advised next visit will need to be impaired   I discussed the assessment and treatment plan with the patient. The patient was provided an opportunity to ask questions and all were answered. The patient agreed with the plan and demonstrated an understanding of the instructions.   The patient was advised to call back or seek an in-person evaluation if the symptoms worsen or if the condition fails to improve as anticipated.     I provided 17 minutes total of non-face-to-face time during this encounter including median intraservice time, reviewing previous notes, labs, imaging, medications, management and patient verbalized understanding.     [name], MD, FAAFP. [facility] [place], [place]   03/12/2019, 3:12 PM

## 2019-03-12 NOTE — Progress Notes (Signed)
Patient has been called and DOB has been verified. Patient has been screened and transferred to PCP to start phone visit.     

## 2019-03-13 ENCOUNTER — Ambulatory Visit (HOSPITAL_BASED_OUTPATIENT_CLINIC_OR_DEPARTMENT_OTHER): Payer: Medicare Other | Admitting: Pharmacist

## 2019-03-13 ENCOUNTER — Ambulatory Visit: Payer: Medicare Other | Attending: Family Medicine

## 2019-03-13 ENCOUNTER — Other Ambulatory Visit: Payer: Self-pay

## 2019-03-13 DIAGNOSIS — Z23 Encounter for immunization: Secondary | ICD-10-CM

## 2019-03-13 DIAGNOSIS — E2839 Other primary ovarian failure: Secondary | ICD-10-CM

## 2019-03-13 DIAGNOSIS — I10 Essential (primary) hypertension: Secondary | ICD-10-CM

## 2019-03-13 DIAGNOSIS — E559 Vitamin D deficiency, unspecified: Secondary | ICD-10-CM

## 2019-03-13 DIAGNOSIS — E0590 Thyrotoxicosis, unspecified without thyrotoxic crisis or storm: Secondary | ICD-10-CM

## 2019-03-13 NOTE — Progress Notes (Signed)
Patient presents for vaccination against strep pneumo per orders of Dr. Margarita Rana. Consent given. Counseling provided. No contraindications exists. Vaccine administered without incident.

## 2019-03-14 ENCOUNTER — Telehealth: Payer: Self-pay

## 2019-03-14 ENCOUNTER — Other Ambulatory Visit: Payer: Self-pay | Admitting: Family Medicine

## 2019-03-14 LAB — CMP14+EGFR
ALT: 29 IU/L (ref 0–32)
AST: 30 IU/L (ref 0–40)
Albumin/Globulin Ratio: 1.8 (ref 1.2–2.2)
Albumin: 4.5 g/dL (ref 3.8–4.8)
Alkaline Phosphatase: 108 IU/L (ref 39–117)
BUN/Creatinine Ratio: 18 (ref 12–28)
BUN: 14 mg/dL (ref 8–27)
Bilirubin Total: 0.2 mg/dL (ref 0.0–1.2)
CO2: 22 mmol/L (ref 20–29)
Calcium: 10.2 mg/dL (ref 8.7–10.3)
Chloride: 102 mmol/L (ref 96–106)
Creatinine, Ser: 0.79 mg/dL (ref 0.57–1.00)
GFR calc Af Amer: 90 mL/min/{1.73_m2} (ref >59)
GFR calc non Af Amer: 78 mL/min/{1.73_m2} (ref >59)
Globulin, Total: 2.5 g/dL (ref 1.5–4.5)
Glucose: 80 mg/dL (ref 65–99)
Potassium: 4.4 mmol/L (ref 3.5–5.2)
Sodium: 142 mmol/L (ref 134–144)
Total Protein: 7 g/dL (ref 6.0–8.5)

## 2019-03-14 LAB — LIPID PANEL
Chol/HDL Ratio: 3.6 ratio (ref 0.0–4.4)
Cholesterol, Total: 229 mg/dL — ABNORMAL HIGH (ref 100–199)
HDL: 64 mg/dL (ref >39)
LDL Chol Calc (NIH): 146 mg/dL — ABNORMAL HIGH (ref 0–99)
Triglycerides: 109 mg/dL (ref 0–149)
VLDL Cholesterol Cal: 19 mg/dL (ref 5–40)

## 2019-03-14 LAB — VITAMIN D 25 HYDROXY (VIT D DEFICIENCY, FRACTURES): Vit D, 25-Hydroxy: 21.2 ng/mL — ABNORMAL LOW (ref 30.0–100.0)

## 2019-03-14 LAB — T4, FREE: Free T4: 1.05 ng/dL (ref 0.82–1.77)

## 2019-03-14 LAB — TSH: TSH: 4.05 u[IU]/mL (ref 0.450–4.500)

## 2019-03-14 MED ORDER — ATORVASTATIN CALCIUM 20 MG PO TABS: 20.0000 mg | ORAL_TABLET | Freq: Every day | ORAL | 3 refills | Status: DC

## 2019-03-14 MED ORDER — VITAMIN D (ERGOCALCIFEROL) 1.25 MG (50000 UNIT) PO CAPS: 50000.0000 [IU] | ORAL_CAPSULE | ORAL | 0 refills | Status: DC

## 2019-03-14 MED FILL — ATORVASTATIN CALCIUM 20 MG: 20 | 90 days supply | Qty: 90 | Fill #0

## 2019-03-14 NOTE — Telephone Encounter (Signed)
Patient name and DOB has been verified Patient was informed of lab results. Patient had no questions.  

## 2019-03-14 NOTE — Telephone Encounter (Signed)
-----   Message from [name], MD sent at 03/14/2019  1:13 PM EST ----- Thyroid panel is normal, vitamin D is low and I have sent a replacement to her pharmacy.  Cholesterol is elevated and I have placed her on a cholesterol pill.  Please advised to adhere to a low-cholesterol diet and exercise.

## 2019-03-15 MED FILL — VIT D2 1.25 MG (50,000 UNIT: 1.25 MG | 28 days supply | Qty: 4 | Fill #0

## 2019-03-15 MED FILL — METOPROLOL SUCCINATE ER 25: 25 | 30 days supply | Qty: 30 | Fill #3

## 2019-04-03 DIAGNOSIS — H524 Presbyopia: Secondary | ICD-10-CM | POA: Diagnosis not present

## 2019-04-03 DIAGNOSIS — Z961 Presence of intraocular lens: Secondary | ICD-10-CM | POA: Diagnosis not present

## 2019-04-11 MED FILL — VIT D2 1.25 MG (50,000 UNIT: 1.25 MG | 28 days supply | Qty: 4 | Fill #1

## 2019-04-11 MED FILL — methIMAzole 5 MG TABS: 5 | 30 days supply | Qty: 30 | Fill #1

## 2019-04-13 ENCOUNTER — Other Ambulatory Visit: Payer: Self-pay

## 2019-04-13 ENCOUNTER — Encounter (HOSPITAL_COMMUNITY): Payer: Self-pay | Admitting: Emergency Medicine

## 2019-04-13 ENCOUNTER — Emergency Department (HOSPITAL_COMMUNITY): Payer: Medicare Other

## 2019-04-13 ENCOUNTER — Emergency Department (HOSPITAL_COMMUNITY)
Admission: EM | Admit: 2019-04-13 | Discharge: 2019-04-13 | Disposition: A | Discharge status: Home / Self Care | Payer: Medicare Other | Attending: Emergency Medicine | Admitting: Emergency Medicine

## 2019-04-13 DIAGNOSIS — U071 COVID-19: Secondary | ICD-10-CM | POA: Diagnosis not present

## 2019-04-13 DIAGNOSIS — R079 Chest pain, unspecified: Secondary | ICD-10-CM

## 2019-04-13 DIAGNOSIS — R05 Cough: Secondary | ICD-10-CM | POA: Diagnosis not present

## 2019-04-13 DIAGNOSIS — Z7982 Long term (current) use of aspirin: Secondary | ICD-10-CM | POA: Diagnosis not present

## 2019-04-13 DIAGNOSIS — R0789 Other chest pain: Secondary | ICD-10-CM | POA: Diagnosis not present

## 2019-04-13 DIAGNOSIS — Z79899 Other long term (current) drug therapy: Secondary | ICD-10-CM | POA: Diagnosis not present

## 2019-04-13 DIAGNOSIS — J45909 Unspecified asthma, uncomplicated: Secondary | ICD-10-CM | POA: Insufficient documentation

## 2019-04-13 DIAGNOSIS — I10 Essential (primary) hypertension: Secondary | ICD-10-CM | POA: Insufficient documentation

## 2019-04-13 LAB — CBC
HCT: 44.4 % (ref 36.0–46.0)
Hemoglobin: 13.9 g/dL (ref 12.0–15.0)
MCH: 23.3 pg — ABNORMAL LOW (ref 26.0–34.0)
MCHC: 31.3 g/dL (ref 30.0–36.0)
MCV: 74.5 fL — ABNORMAL LOW (ref 80.0–100.0)
Platelets: 229 10*3/uL (ref 150–400)
RBC: 5.96 MIL/uL — ABNORMAL HIGH (ref 3.87–5.11)
RDW: 16.2 % — ABNORMAL HIGH (ref 11.5–15.5)
WBC: 3.3 10*3/uL — ABNORMAL LOW (ref 4.0–10.5)
nRBC: 0 % (ref 0.0–0.2)

## 2019-04-13 LAB — BASIC METABOLIC PANEL
Anion gap: 10 (ref 5–15)
BUN: 9 mg/dL (ref 8–23)
CO2: 22 mmol/L (ref 22–32)
Calcium: 9.7 mg/dL (ref 8.9–10.3)
Chloride: 107 mmol/L (ref 98–111)
Creatinine, Ser: 0.76 mg/dL (ref 0.44–1.00)
GFR calc Af Amer: >60 mL/min (ref >60)
GFR calc non Af Amer: >60 mL/min (ref >60)
Glucose, Bld: 114 mg/dL — ABNORMAL HIGH (ref 70–99)
Potassium: 4 mmol/L (ref 3.5–5.1)
Sodium: 139 mmol/L (ref 135–145)

## 2019-04-13 LAB — TROPONIN I (HIGH SENSITIVITY)
Troponin I (High Sensitivity): 12 ng/L (ref <18)
Troponin I (High Sensitivity): 12 ng/L (ref <18)

## 2019-04-13 LAB — SARS CORONAVIRUS 2 (TAT 6-24 HRS): SARS Coronavirus 2: POSITIVE — AB

## 2019-04-13 MED ORDER — SODIUM CHLORIDE 0.9% FLUSH: 3.0000 mL | Freq: Once | INTRAVENOUS | Status: DC

## 2019-04-13 MED ORDER — AZITHROMYCIN 250 MG PO TABS: ORAL_TABLET | ORAL | 0 refills | Status: DC

## 2019-04-13 MED ORDER — DEXAMETHASONE SODIUM PHOSPHATE 10 MG/ML IJ SOLN
8.0000 mg | Freq: Once | INTRAMUSCULAR | Status: AC
  Administered 2019-04-13: 8 mg via INTRAMUSCULAR
  Filled 2019-04-13: qty 1

## 2019-04-13 MED ORDER — ALBUTEROL SULFATE HFA 108 (90 BASE) MCG/ACT IN AERS
2.0000 | INHALATION_SPRAY | RESPIRATORY_TRACT | Status: DC
  Administered 2019-04-13 (×2): 2 via RESPIRATORY_TRACT
  Filled 2019-04-13: qty 6.7

## 2019-04-13 MED FILL — AZITHROMYCIN 250 MG TABLET: 250 | 5 days supply | Qty: 6 | Fill #0

## 2019-04-13 NOTE — Discharge Instructions (Signed)
You may have a small amount of pneumonia in your left lung.  Prescription for antibiotic.  You were given an injection of steroids today.  Follow-up with [facility].
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
## 2019-04-13 NOTE — ED Notes (Signed)
Patient verbalizes understanding of discharge instructions. Opportunity for questioning and answers were provided. Armband removed by staff, pt discharged from ED.  

## 2019-04-14 ENCOUNTER — Other Ambulatory Visit: Payer: Self-pay | Admitting: Nurse Practitioner

## 2019-04-14 ENCOUNTER — Telehealth: Payer: Self-pay | Admitting: Nurse Practitioner

## 2019-04-14 ENCOUNTER — Encounter: Payer: Self-pay | Admitting: Nurse Practitioner

## 2019-04-14 DIAGNOSIS — U071 COVID-19: Secondary | ICD-10-CM | POA: Insufficient documentation

## 2019-04-14 NOTE — Telephone Encounter (Signed)
  I connected by phone with Kayla Preston on 04/14/2019 at 1:28 PM to discuss the potential use of an new treatment for mild to moderate COVID-19 viral infection in non-hospitalized patients. Current symptoms started on Tuesday with coughing, she tested positive yesterday and was noted to have PNA on imaging at [facility].  Currently has Zpack and inhaler.  Is interested in obtaining infusion.  She is aware if any worsening symptoms prior to infusion time Tuesday to go immediately to ER.  This patient is a 68 y.o. female that meets the FDA criteria for Emergency Use Authorization of bamlanivimab:  Has a (+) direct SARS-CoV-2 viral test result  Has mild or moderate COVID-19   Is ? 68 years of age and weighs ? 40 kg  Is NOT hospitalized due to COVID-19  Is NOT requiring oxygen therapy or requiring an increase in baseline oxygen flow rate due to COVID-19  Is within 10 days of symptom onset  Has at least one of the high risk factor(s) for progression to severe COVID-19 and/or hospitalization as defined in EUA.  Specific high risk criteria : Hypertension, currently managed by her PCP  Reviewed patient chronic problem list, which is currently managed by their PCP.  I have spoken and communicated the following to the patient or parent/caregiver:  1. FDA has authorized the emergency use of bamlanivimab for the treatment of mild to moderate COVID-19 in adults and pediatric patients with positive results of direct SARS-CoV-2 viral testing who are 79 years of age and older weighing at least 40 kg, and who are at high risk for progressing to severe COVID-19 and/or hospitalization.  2. The significant known and potential risks and benefits of bamlanivimab, and the extent to which such potential risks and benefits are unknown.  3. Information on available alternative treatments and the risks and benefits of those alternatives, including clinical trials.  4. Patients treated with bamlanivimab should  continue to self-isolate and use infection control measures (e.g., wear mask, isolate, social distance, avoid sharing personal items, clean and disinfect "high touch" surfaces, and frequent handwashing) according to CDC guidelines.   5. The patient or parent/caregiver has the option to accept or refuse bamlanivimab.  After reviewing this information with the patient, The patient agreed to proceed with receiving the infusion of bamlanivimab and will be provided a copy of the Fact sheet prior to receiving the infusion.  Tonya Carlile T Afton Lavalle 04/14/2019 1:28 PM

## 2019-04-14 NOTE — Progress Notes (Signed)
[facility] orders for infusion Tuesday 04/17/19

## 2019-04-15 NOTE — Telephone Encounter (Signed)
Voicemail left for her to return call to me - 1050 12/6

## 2019-04-16 NOTE — Telephone Encounter (Signed)
Scheduled for 12/8 at 0830

## 2019-04-17 ENCOUNTER — Ambulatory Visit (HOSPITAL_COMMUNITY)
Admission: RE | Admit: 2019-04-17 | Discharge: 2019-04-17 | Disposition: A | Discharge status: Home / Self Care | Payer: Medicare Other | Source: Ambulatory Visit | Attending: Pulmonary Disease | Admitting: Pulmonary Disease

## 2019-04-17 DIAGNOSIS — U071 COVID-19: Secondary | ICD-10-CM | POA: Diagnosis not present

## 2019-04-17 DIAGNOSIS — Z23 Encounter for immunization: Secondary | ICD-10-CM | POA: Diagnosis not present

## 2019-04-17 MED ORDER — SODIUM CHLORIDE 0.9 % IV SOLN
INTRAVENOUS | Status: DC | PRN
  Administered 2019-04-17: 1000 mL via INTRAVENOUS

## 2019-04-17 MED ORDER — METHYLPREDNISOLONE SODIUM SUCC 125 MG IJ SOLR: 125.0000 mg | Freq: Once | INTRAMUSCULAR | Status: DC | PRN

## 2019-04-17 MED ORDER — FAMOTIDINE IN NACL 20-0.9 MG/50ML-% IV SOLN: 20.0000 mg | Freq: Once | INTRAVENOUS | Status: DC | PRN

## 2019-04-17 MED ORDER — ALBUTEROL SULFATE HFA 108 (90 BASE) MCG/ACT IN AERS: 2.0000 | INHALATION_SPRAY | Freq: Once | RESPIRATORY_TRACT | Status: DC | PRN

## 2019-04-17 MED ORDER — SODIUM CHLORIDE 0.9 % IV SOLN
700.0000 mg | Freq: Once | INTRAVENOUS | Status: AC
  Administered 2019-04-17: 700 mg via INTRAVENOUS
  Filled 2019-04-17: qty 20

## 2019-04-17 MED ORDER — EPINEPHRINE 0.3 MG/0.3ML IJ SOAJ: 0.3000 mg | Freq: Once | INTRAMUSCULAR | Status: DC | PRN

## 2019-04-17 MED ORDER — DIPHENHYDRAMINE HCL 50 MG/ML IJ SOLN: 50.0000 mg | Freq: Once | INTRAMUSCULAR | Status: DC | PRN

## 2019-04-17 NOTE — Progress Notes (Signed)
  Diagnosis: COVID-19  Physician:  Dr. Wright  Procedure: [facility] Med: bamlanivimab infusion - Provided patient with bamlanimivab fact sheet for patients, parents and caregivers prior to infusion.  Complications: No immediate complications noted.  Discharge: Discharged home   Saniah Schroeter 04/17/2019  

## 2019-04-18 ENCOUNTER — Other Ambulatory Visit: Payer: Medicaid Other

## 2019-04-19 ENCOUNTER — Other Ambulatory Visit: Payer: Medicaid Other

## 2019-04-23 ENCOUNTER — Telehealth: Payer: Self-pay

## 2019-04-23 NOTE — Telephone Encounter (Signed)
Patient was tested positive for Covid and has completed her Z-pack and using her inhaler. Patient states that when using the inhaler she feel like it is a mucus build up in the back of her throat.  Patient would like a medication to help with the mucus in her throat.

## 2019-04-24 MED ORDER — CETIRIZINE HCL 10 MG PO TABS: 10.0000 mg | ORAL_TABLET | Freq: Every day | ORAL | 1 refills | Status: DC

## 2019-04-24 NOTE — Telephone Encounter (Signed)
I have sent a prescription for Zyrtec to her pharmacy 

## 2019-04-24 NOTE — Telephone Encounter (Signed)
Patient was called and a voicemail was left informing patient to return phone call. 

## 2019-04-24 NOTE — Telephone Encounter (Signed)
Patient called back

## 2019-04-25 ENCOUNTER — Other Ambulatory Visit: Payer: Self-pay

## 2019-04-25 ENCOUNTER — Ambulatory Visit: Payer: Medicare Other | Attending: Internal Medicine

## 2019-04-25 DIAGNOSIS — Z20822 Contact with and (suspected) exposure to covid-19: Secondary | ICD-10-CM

## 2019-04-25 NOTE — Telephone Encounter (Signed)
Patient called asking to speak with Angelina Sheriff

## 2019-04-25 NOTE — Telephone Encounter (Signed)
Patient was informed of Zrytec being sent to pharmacy.

## 2019-04-27 LAB — NOVEL CORONAVIRUS, NAA: SARS-CoV-2, NAA: NOT DETECTED

## 2019-05-07 MED FILL — VIT D2 1.25 MG (50,000 UNIT: 1.25 MG | 28 days supply | Qty: 4 | Fill #2

## 2019-05-07 MED FILL — LINZESS 145 MCG CAPSULE: 145 | 90 days supply | Qty: 90 | Fill #2

## 2019-05-07 MED FILL — methIMAzole 5 MG TABS: 5 | 30 days supply | Qty: 30 | Fill #2

## 2019-05-22 ENCOUNTER — Encounter: Payer: Self-pay | Admitting: Cardiovascular Disease

## 2019-05-22 ENCOUNTER — Ambulatory Visit (INDEPENDENT_AMBULATORY_CARE_PROVIDER_SITE_OTHER): Payer: Medicare Other | Admitting: Cardiovascular Disease

## 2019-05-22 ENCOUNTER — Other Ambulatory Visit: Payer: Self-pay

## 2019-05-22 VITALS — BP 130/88 | HR 75 | Temp 97.9°F | Ht 65.0 in | Wt 216.0 lb

## 2019-05-22 DIAGNOSIS — R002 Palpitations: Secondary | ICD-10-CM | POA: Diagnosis not present

## 2019-05-22 DIAGNOSIS — U071 COVID-19: Secondary | ICD-10-CM

## 2019-05-22 DIAGNOSIS — I4510 Unspecified right bundle-branch block: Secondary | ICD-10-CM | POA: Diagnosis not present

## 2019-05-22 DIAGNOSIS — E0590 Thyrotoxicosis, unspecified without thyrotoxic crisis or storm: Secondary | ICD-10-CM

## 2019-05-22 DIAGNOSIS — I519 Heart disease, unspecified: Secondary | ICD-10-CM

## 2019-05-22 DIAGNOSIS — I10 Essential (primary) hypertension: Secondary | ICD-10-CM | POA: Diagnosis not present

## 2019-05-22 DIAGNOSIS — E6609 Other obesity due to excess calories: Secondary | ICD-10-CM | POA: Diagnosis not present

## 2019-05-22 DIAGNOSIS — Z6835 Body mass index (BMI) 35.0-35.9, adult: Secondary | ICD-10-CM | POA: Diagnosis not present

## 2019-05-22 DIAGNOSIS — I5189 Other ill-defined heart diseases: Secondary | ICD-10-CM

## 2019-05-22 NOTE — Progress Notes (Signed)
Cardiology Office Note    Date:  05/24/2019   ID:  Kayla Preston, DOB 1950-08-21, MRN [id number]  PCP:  Kayla Rakes, MD  Cardiologist:  Kayla Majestic, MD   63-monthfollow-up evaluation  History of Present Illness:  Kayla Preston a 69y.o. female  who was referred byEnobongfor evaluation of palpitations.  She was evaluated initially on September 05, 2018 in a telemedicine visit and evaluated in follow-up on October 26, 2018.  She presents for a 764-monthollow-up cardiology evaluation.  [facility] and underwent an atrial fibrillation ablation. She had done very well with reference to this. She has a history of hypertension and has been on amlodipine in addition tohydrochlorothiazide.She also has GI issues which have improved with Linzess.Recently, she has begun to notice episodic palpitations associated with night sweats. She denied any associated chest pain. She had undergone recent laboratory and was found to have an over suppressed thyroid TSH of 0.020. She was scheduled to see an endocrinologist and  was started on methimazole 5 mg.  She was subsequently evaluated by Kayla Preston Subsequent TSH and free T4 were normal and she was advised to continue her same medication.  She also has a history of vitamin D deficiency and was on 50,000 units weekly.  When I initially evaluated her, she was on amlodipine and HCTZ for hypertension.  She was found to have over suppressed thyroid which may be contributing to her arrhythmia and palpitations elected to add Toprol-XL initially at 25 mg.  I recommended that she undergo a 2D echo Doppler study which was done on October 11, 2018 and showed normal LV function with mild LVH and mild diastolic dysfunction.  There is very mild dilation of her aortic root at 39 mm.  When she was evaluated in her follow-up telemedicine evaluation in June 2020 she noted significant benefit since the initiation of  Toprol-XL.  She states her blood pressure typically runs in the 13479ystolic range.  She denied any recurrent palpitations,presyncope or syncope or chest pain.  She was sleeping well but often wakes up when neighbors in a nearby apartment seem to smoke in the early morning hours with weed and other compounds  which wakes her up and contributes to congestion.  Since I last saw her, she was diagnosed with COVID-19 infection and questionable pneumonia.  She was treated with albuterol, Decadron, as well as Zithromax in the emergency room on April 13, 2019.  On December 8 she had an infusion of bamlanivimab.  Following her infusion therapy she felt significant benefit in her symptomatology and shortness of breath.  Presently, she is without chest pain or palpitations.  She will be having repeat lab work by Dr. EnCharlott Preston She has felt well with metoprolol XL 25 mg and is without recurrent tachypalpitations.  She continues to be on HCTZ 12.5 mg and denies leg swelling.  She also was on atorvastatin 20 mg for hyperlipidemia.  She presents for repeat evaluation.  Past Medical History:  Diagnosis Date  . Atrial fibrillation ([facility]  . Atrial fibrillation ([facility]   she had ablation which resolved intermittent afib  . Hypertension   . Obesity   . Seasonal allergies     Past Surgical History:  Procedure Laterality Date  . ABLATION      Current Medications: Outpatient Medications Prior to Visit  Medication Sig Dispense Refill  . aspirin EC 81 MG tablet Take 81 mg  by mouth daily.    Marland Kitchen atorvastatin (LIPITOR) 20 MG tablet Take 1 tablet (20 mg total) by mouth daily. 30 tablet 3  . Cyanocobalamin (VITAMIN B 12 PO) Take 1 tablet by mouth daily. Reported on 07/22/2015    . hydrochlorothiazide (HYDRODIURIL) 25 MG tablet Take 0.5 tablets (12.5 mg total) by mouth daily. 90 tablet 1  . LINZESS 145 MCG CAPS capsule Take 1 capsule by mouth daily.  2  . methimazole (TAPAZOLE) 5 MG tablet TAKE 1 TABLET (5 MG  TOTAL) BY MOUTH DAILY. 30 tablet 3  . metoprolol succinate (TOPROL XL) 25 MG 24 hr tablet Take 1 tablet (25 mg total) by mouth daily. 90 tablet 1  . Misc. Devices MISC Blood Pressure monitor. Dx: Hypertension 1 each 0  . Vitamin D, Ergocalciferol, (DRISDOL) 1.25 MG (50000 UT) CAPS capsule Take 1 capsule (50,000 Units total) by mouth every 7 (seven) days. 16 capsule 0  . azithromycin (ZITHROMAX) 250 MG tablet 2 tablets day 1; 1 tablet day 2 through 5 6 each 0  . cetirizine (ZYRTEC) 10 MG tablet Take 1 tablet (10 mg total) by mouth daily. 30 tablet 1   No facility-administered medications prior to visit.     Allergies:   Patient has no known allergies.   Social History   Socioeconomic History  . Marital status: Single    Spouse name: Not on file  . Number of children: Not on file  . Years of education: Not on file  . Highest education level: Not on file  Occupational History  . Not on file  Tobacco Use  . Smoking status: Never Smoker  . Smokeless tobacco: Never Used  Substance and Sexual Activity  . Alcohol use: No  . Drug use: No  . Sexual activity: Not Currently  Other Topics Concern  . Not on file  Social History Narrative  . Not on file   Social Determinants of Health   Financial Resource Strain:   . Difficulty of Paying Living Expenses: Not on file  Food Insecurity:   . Worried About Charity fundraiser in the Last Year: Not on file  . Ran Out of Food in the Last Year: Not on file  Transportation Needs:   . Lack of Transportation (Medical): Not on file  . Lack of Transportation (Non-Medical): Not on file  Physical Activity:   . Days of Exercise per Week: Not on file  . Minutes of Exercise per Session: Not on file  Stress:   . Feeling of Stress : Not on file  Social Connections:   . Frequency of Communication with Friends and Family: Not on file  . Frequency of Social Gatherings with Friends and Family: Not on file  . Attends Religious Services: Not on file  .  Active Member of Clubs or Organizations: Not on file  . Attends Archivist Meetings: Not on file  . Marital Status: Not on file     Family History:  The patient's family history includes Heart disease in her father and mother; Hyperlipidemia in her sister.   ROS General: Negative; No fevers, chills, or night sweats;  HEENT: Negative; No changes in vision or hearing, sinus congestion, difficulty swallowing Pulmonary: Negative; No cough, wheezing, shortness of breath, hemoptysis Cardiovascular: Positive for recent Covid infection with possible pneumonia GI: Negative; No nausea, vomiting, diarrhea, or abdominal pain GU: Negative; No dysuria, hematuria, or difficulty voiding Musculoskeletal: Negative; no myalgias, joint pain, or weakness Hematologic/Oncology: Negative; no easy bruising, bleeding Endocrine:  Negative; no heat/cold intolerance; no diabetes Neuro: Negative; no changes in balance, headaches Skin: Negative; No rashes or skin lesions Psychiatric: Negative; No behavioral problems, depression Sleep: Negative; No snoring, daytime sleepiness, hypersomnolence, bruxism, restless legs, hypnogognic hallucinations, no cataplexy Other comprehensive 14 point system review is negative.   PHYSICAL EXAM:   VS:  BP 130/88 (BP Location: Left Arm, Patient Position: Sitting, Cuff Size: Normal)   Pulse 75   Temp 97.9 F (36.6 C)   Ht '5\' 5"'$  (1.651 m)   Wt 216 lb (98 kg)   BMI 35.94 kg/m    Blood pressure by me 128/88 Wt Readings from Last 3 Encounters:  05/22/19 216 lb (98 kg)  10/26/18 198 lb (89.8 kg)  09/05/18 197 lb (89.4 kg)    General: Alert, oriented, no distress.  Skin: normal turgor, no rashes, warm and dry HEENT: Normocephalic, atraumatic. Pupils equal round and reactive to light; sclera anicteric; extraocular muscles intact;  Nose without nasal septal hypertrophy Mouth/Parynx benign; Mallinpatti scale 3 Neck: No JVD, no carotid bruits; normal carotid  upstroke Lungs: clear to ausculatation and percussion; no wheezing or rales Chest wall: without tenderness to palpitation Heart: PMI not displaced, RRR, s1 s2 normal, 1/6 systolic murmur, no diastolic murmur, no rubs, gallops, thrills, or heaves Abdomen: Central adiposity;  soft, nontender; no hepatosplenomehaly, BS+; abdominal aorta nontender and not dilated by palpation. Back: no CVA tenderness Pulses 2+ Musculoskeletal: full range of motion, normal strength, no joint deformities Extremities: no clubbing cyanosis or edema, Homan's sign negative  Neurologic: grossly nonfocal; Cranial nerves grossly wnl Psychologic: Normal mood and affect   Studies/Labs Reviewed:   EKG:  EKG  ordered today.  ECG (independently read by me): Normal sinus rhythm at 75 bpm.  Incomplete right bundle branch block.  No ectopy.  Recent Labs: BMP Latest Ref Rng & Units 04/13/2019 03/13/2019 07/27/2018  Glucose 70 - 99 mg/dL 114(H) 80 83  BUN 8 - 23 mg/dL '9 14 15  '$ Creatinine 0.44 - 1.00 mg/dL 0.76 0.79 0.71  BUN/Creat Ratio 12 - 28 - 18 21  Sodium 135 - 145 mmol/L 139 142 142  Potassium 3.5 - 5.1 mmol/L 4.0 4.4 4.2  Chloride 98 - 111 mmol/L 107 102 104  CO2 22 - 32 mmol/L 22 22 19(L)  Calcium 8.9 - 10.3 mg/dL 9.7 10.2 9.7     Hepatic Function Latest Ref Rng & Units 03/13/2019 07/27/2018 12/12/2017  Total Protein 6.0 - 8.5 g/dL 7.0 6.8 6.9  Albumin 3.8 - 4.8 g/dL 4.5 4.5 4.3  AST 0 - 40 IU/L '30 23 21  '$ ALT 0 - 32 IU/L 29 41(H) 23  Alk Phosphatase 39 - 117 IU/L 108 77 83  Total Bilirubin 0.0 - 1.2 mg/dL 0.2 0.3 0.3  Bilirubin, Direct <=0.2 mg/dL - - -    CBC Latest Ref Rng & Units 04/13/2019 03/06/2018 11/18/2017  WBC 4.0 - 10.5 K/uL 3.3(L) 4.1 4.6  Hemoglobin 12.0 - 15.0 g/dL 13.9 13.1 12.9  Hematocrit 36.0 - 46.0 % 44.4 41.9 41.8  Platelets 150 - 400 K/uL 229 225 268   Lab Results  Component Value Date   MCV 74.5 (L) 04/13/2019   MCV 75.0 (L) 03/06/2018   MCV 77 (L) 11/18/2017   Lab Results   Component Value Date   TSH 4.050 03/13/2019   Lab Results  Component Value Date   HGBA1C 6.0 (H) 11/18/2017     BNP No results found for: BNP  ProBNP No results found for: PROBNP  Lipid Panel     Component Value Date/Time   CHOL 229 (H) 03/13/2019 0848   TRIG 109 03/13/2019 0848   HDL 64 03/13/2019 0848   CHOLHDL 3.6 03/13/2019 0848   CHOLHDL 3.4 05/17/2016 1004   VLDL 18 05/17/2016 1004   LDLCALC 146 (H) 03/13/2019 0848   LABVLDL 19 03/13/2019 0848     RADIOLOGY: No results found.   Additional studies/ records that were reviewed today include:   ECHO 10/11/2018 IMPRESSIONS  1. The left ventricle has normal systolic function, with an ejection fraction of 55-60%. The cavity size was normal. There is mildly increased left ventricular wall thickness. Left ventricular diastolic Doppler parameters are consistent with impaired  relaxation. No evidence of left ventricular regional wall motion abnormalities.  2. The right ventricle has normal systolic function. The cavity was normal. There is no increase in right ventricular wall thickness.  3. There is mild mitral annular calcification present. Trivial mitral regurgitation.  4. The aortic valve is tricuspid. No stenosis of the aortic valve.  5. There is mild dilatation of the aortic root measuring 39 mm.  6. Normal IVC size. PA systolic pressure 21 mmHg.  ASSESSMENT:    1. Palpitations   2. Essential hypertension   3. Bundle branch block, right   4. Hyperthyroidism   5. Grade I diastolic dysfunction   6. COVID-19 virus infection   7. Class 2 obesity due to excess calories without serious comorbidity with body mass index (BMI) of 35.0 to 35.9 in adult      PLAN:  1.  Essential hypertension: Currently controlled and she continues to be on Toprol-XL 25 mg, HCTZ 12.5 mg.  2.  Palpitations: Resolved with addition of low-dose metoprolol succinate and improvement in her TSH over suppression on methimazole.  3.  Grade  1 diastolic dysfunction: Noted on echo Doppler study on October 11, 2018.  Normal systolic function.  4.  Recent COVID-19 infection: Treated with steroids, Zithromax, and received bamlanivimab infusion therapy with marked resolution of symptoms.  5.  Right bundle branch block: Stable on current ECG  6.  Hyperthyroidism, TSH initially was over suppressed at 0.02 for which she was started on methimazole.  Most recent TSH has normalized and in November 2020 was 4.05.  7.  Moderate obesity: BMI 35.94.  Discussed the importance of weight loss.  Discussed exercise and walking at least 5 days/week for 30 minutes if at all possible.  Medication Adjustments/Labs and Tests Ordered: Current medicines are reviewed at length with the patient today.  Concerns regarding medicines are outlined above.  Medication changes, Labs and Tests ordered today are listed in the Patient Instructions below. Patient Instructions  Medication Instructions:  Your physician recommends that you continue on your current medications as directed. Please refer to the Current Medication list given to you today.  If you need a refill on your cardiac medications before your next appointment, please call your pharmacy.   Lab work: NONE  Testing/Procedures: NONE  Follow-Up: At [facility], you and your health needs are our priority.  As part of our continuing mission to provide you with exceptional heart care, we have created designated Provider Care Teams.  These Care Teams include your primary Cardiologist (physician) and Advanced Practice Providers (APPs -  Physician Assistants and Nurse Practitioners) who all work together to provide you with the care you need, when you need it. You may see Dr. Claiborne Billings or one of the following Advanced Practice Providers on your designated Care Team:  Almyra Deforest, PA-C  Fabian Sharp, PA-C or   Roby Lofts, [place]  Your physician wants you to follow-up in: 1 year with Dr. Claiborne Billings            Signed, Kayla Majestic, MD  05/24/2019 1:23 PM    [facility] [address] [place], [place], [place]  [address] Phone: [phone]

## 2019-05-22 NOTE — Patient Instructions (Signed)
Medication Instructions:  Your physician recommends that you continue on your current medications as directed. Please refer to the Current Medication list given to you today.  If you need a refill on your cardiac medications before your next appointment, please call your pharmacy.   Lab work: NONE  Testing/Procedures: NONE  Follow-Up: At [facility], you and your health needs are our priority.  As part of our continuing mission to provide you with exceptional heart care, we have created designated Provider Care Teams.  These Care Teams include your primary Cardiologist (physician) and Advanced Practice Providers (APPs -  Physician Assistants and Nurse Practitioners) who all work together to provide you with the care you need, when you need it. You may see Dr. Kelly or one of the following Advanced Practice Providers on your designated Care Team:    Hao Meng, PA-C  Angela Duke, PA-C or   Krista Kroeger, PA-C  Your physician wants you to follow-up in: 1 year with Dr. Kelly      

## 2019-05-24 ENCOUNTER — Encounter: Payer: Self-pay | Admitting: Cardiovascular Disease

## 2019-06-07 ENCOUNTER — Other Ambulatory Visit: Payer: Self-pay | Admitting: Cardiovascular Disease

## 2019-06-07 MED FILL — methIMAzole 5 MG TABS: 5 | 30 days supply | Qty: 30 | Fill #3

## 2019-06-07 MED FILL — VIT D2 1.25 MG (50,000 UNIT: 1.25 MG | 28 days supply | Qty: 4 | Fill #3

## 2019-06-07 MED FILL — METOPROLOL SUCCINATE ER 25: 25 | 90 days supply | Qty: 90 | Fill #0

## 2019-06-18 IMAGING — MG DIGITAL SCREENING BILATERAL MAMMOGRAM WITH CAD
7 series · 7 of 7 positions shown · non-contrast
Comparison: Previous exam(s).

CLINICAL DATA: Screening.

EXAM:
DIGITAL SCREENING BILATERAL MAMMOGRAM WITH CAD

[L CC]
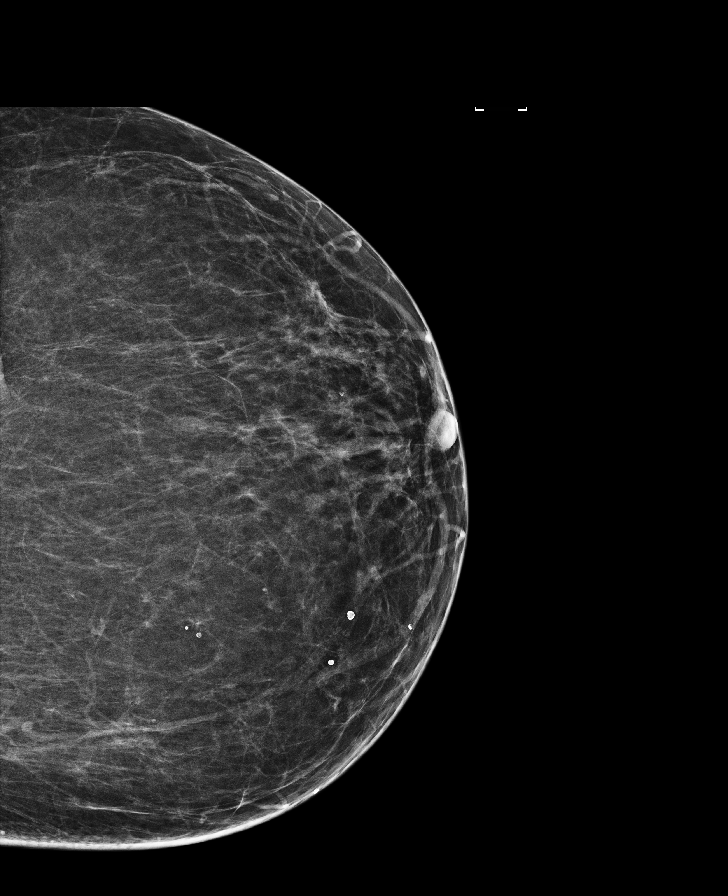

[R CC (1 of 2)]
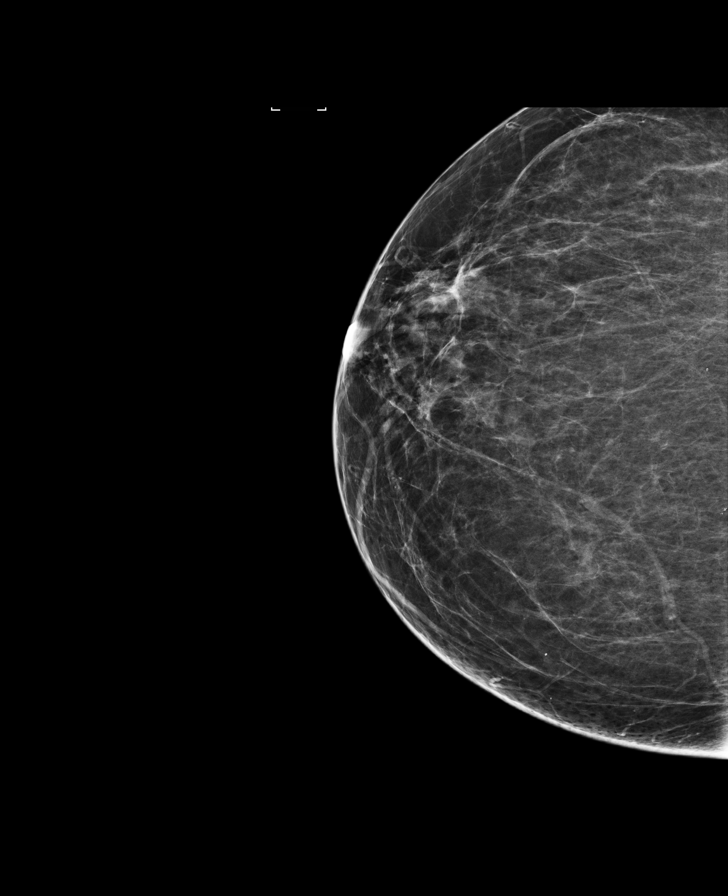

[R MLO (1 of 2)]
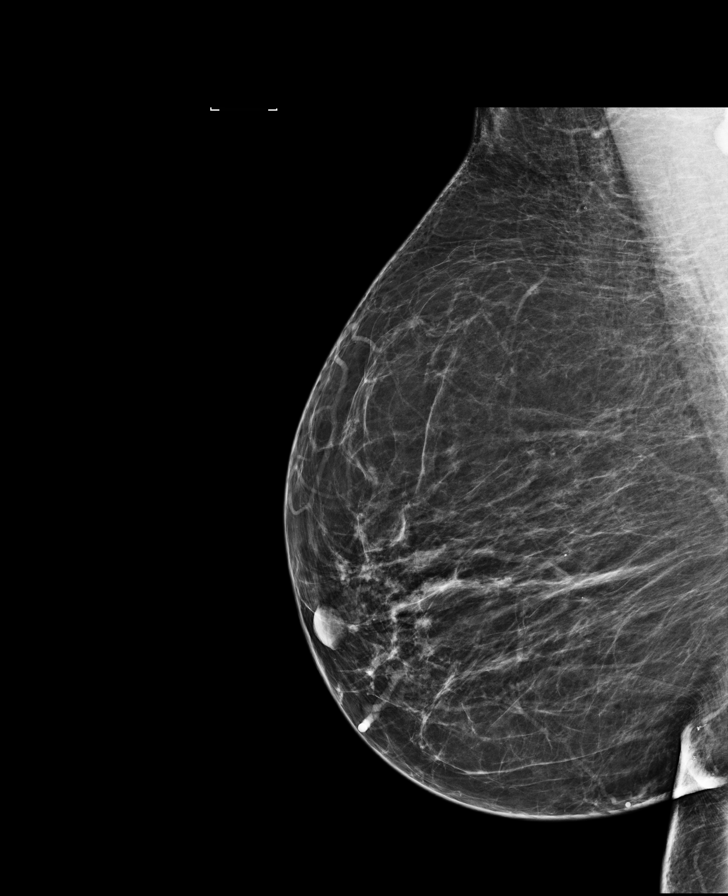

[L MLO (1 of 2)]
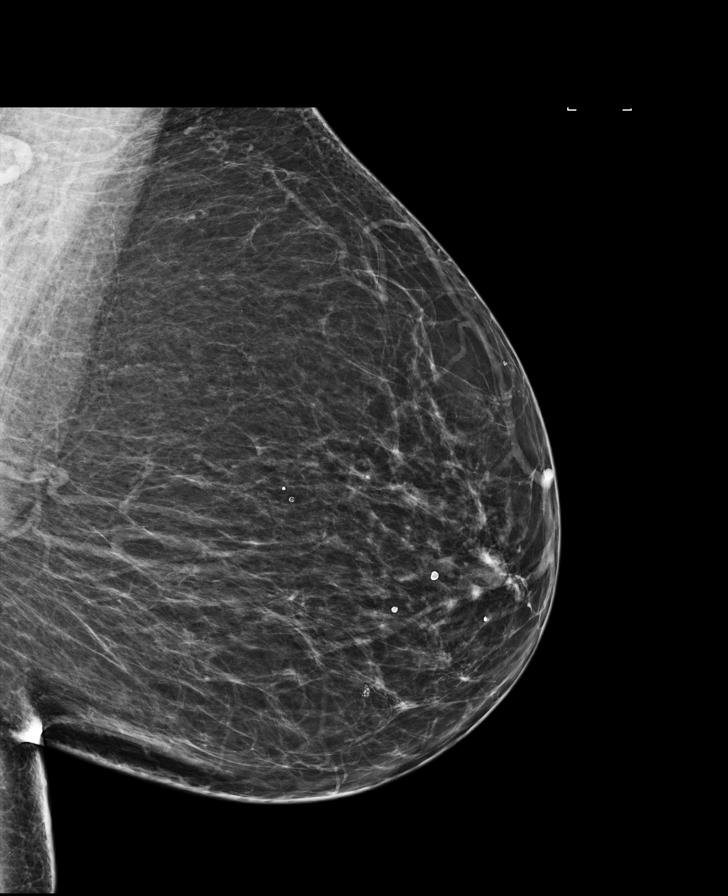

[R CC (2 of 2)]
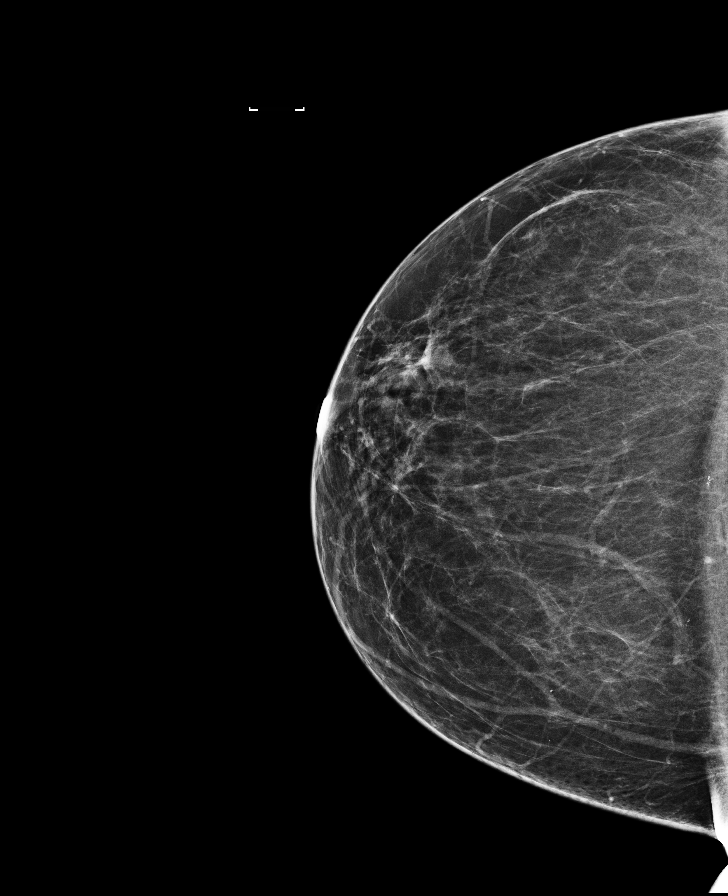

[L MLO (2 of 2)]
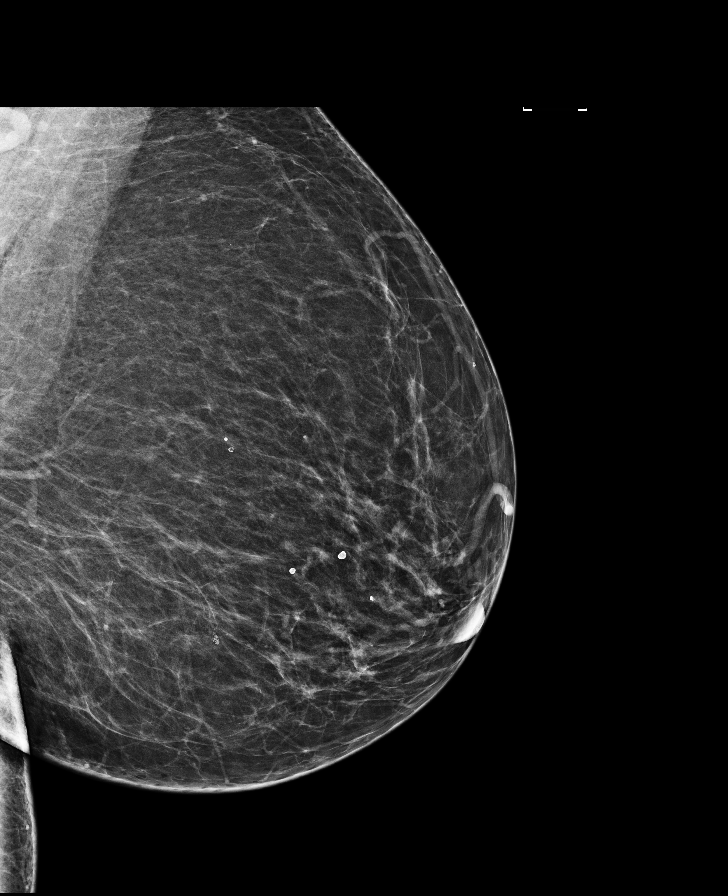

[R MLO (2 of 2)]
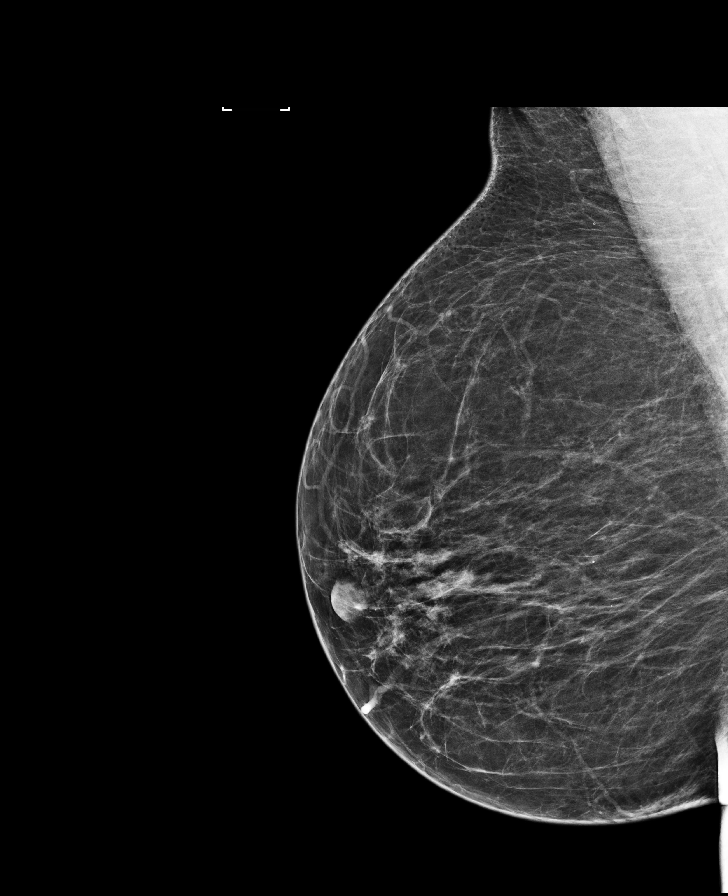

[7 of 7 positions shown; findings below may reference images not displayed]

ACR Breast Density Category b: There are scattered areas of
fibroglandular density.
FINDINGS: There are no findings suspicious for malignancy. Images were
processed with CAD.
IMPRESSION: No mammographic evidence of malignancy. A result letter of this
screening mammogram will be mailed directly to the patient.

RECOMMENDATION:
Screening mammogram in one year. (Code:AS-G-LCT)

BI-RADS CATEGORY  1: Negative.

## 2019-06-26 ENCOUNTER — Ambulatory Visit
Admission: RE | Admit: 2019-06-26 | Discharge: 2019-06-26 | Disposition: A | Discharge status: Home / Self Care | Payer: Medicare Other | Source: Ambulatory Visit | Attending: Family Medicine | Admitting: Family Medicine

## 2019-06-26 ENCOUNTER — Other Ambulatory Visit: Payer: Self-pay

## 2019-06-26 DIAGNOSIS — E2839 Other primary ovarian failure: Secondary | ICD-10-CM

## 2019-06-26 DIAGNOSIS — Z780 Asymptomatic menopausal state: Secondary | ICD-10-CM | POA: Diagnosis not present

## 2019-06-26 DIAGNOSIS — Z13820 Encounter for screening for osteoporosis: Secondary | ICD-10-CM | POA: Diagnosis not present

## 2019-07-09 ENCOUNTER — Other Ambulatory Visit: Payer: Self-pay | Admitting: Family Medicine

## 2019-07-09 ENCOUNTER — Other Ambulatory Visit: Payer: Self-pay | Admitting: Nurse Practitioner

## 2019-07-09 MED FILL — HYDROCHLOROTHIAZIDE 25 MG T: 25 | 90 days supply | Qty: 45 | Fill #1

## 2019-07-09 MED FILL — ATORVASTATIN CALCIUM 20 MG: 20 | 30 days supply | Qty: 30 | Fill #1

## 2019-07-10 MED FILL — methIMAzole 5 MG TABS: 5 | 30 days supply | Qty: 30 | Fill #0

## 2019-07-10 MED FILL — VIT D2 1.25 MG (50,000 UNIT: 1.25 MG | 28 days supply | Qty: 4 | Fill #0

## 2019-08-02 MED FILL — LINZESS 145 MCG CAPSULE: 145 | 60 days supply | Qty: 60 | Fill #3

## 2019-08-14 ENCOUNTER — Other Ambulatory Visit: Payer: Self-pay | Admitting: Family Medicine

## 2019-08-14 MED FILL — methIMAzole 5 MG TABS: 5 | 90 days supply | Qty: 90 | Fill #1

## 2019-10-01 DIAGNOSIS — H2511 Age-related nuclear cataract, right eye: Secondary | ICD-10-CM | POA: Diagnosis not present

## 2019-10-01 DIAGNOSIS — Z961 Presence of intraocular lens: Secondary | ICD-10-CM | POA: Diagnosis not present

## 2019-10-17 ENCOUNTER — Other Ambulatory Visit: Payer: Self-pay | Admitting: Gastroenterology

## 2019-10-17 DIAGNOSIS — K5909 Other constipation: Secondary | ICD-10-CM | POA: Diagnosis not present

## 2019-10-17 DIAGNOSIS — Z86010 Personal history of colonic polyps: Secondary | ICD-10-CM | POA: Diagnosis not present

## 2019-10-31 ENCOUNTER — Other Ambulatory Visit: Payer: Self-pay

## 2019-10-31 ENCOUNTER — Ambulatory Visit: Payer: Medicare Other | Attending: Family Medicine | Admitting: Family Medicine

## 2019-10-31 ENCOUNTER — Encounter: Payer: Self-pay | Admitting: Family Medicine

## 2019-10-31 VITALS — BP 122/62 | HR 86 | Ht 65.0 in | Wt 213.0 lb

## 2019-10-31 DIAGNOSIS — R7303 Prediabetes: Secondary | ICD-10-CM | POA: Diagnosis not present

## 2019-10-31 DIAGNOSIS — E039 Hypothyroidism, unspecified: Secondary | ICD-10-CM | POA: Insufficient documentation

## 2019-10-31 DIAGNOSIS — I10 Essential (primary) hypertension: Secondary | ICD-10-CM

## 2019-10-31 DIAGNOSIS — Z79899 Other long term (current) drug therapy: Secondary | ICD-10-CM | POA: Insufficient documentation

## 2019-10-31 DIAGNOSIS — Z8249 Family history of ischemic heart disease and other diseases of the circulatory system: Secondary | ICD-10-CM | POA: Insufficient documentation

## 2019-10-31 DIAGNOSIS — I4891 Unspecified atrial fibrillation: Secondary | ICD-10-CM | POA: Insufficient documentation

## 2019-10-31 DIAGNOSIS — Z8616 Personal history of COVID-19: Secondary | ICD-10-CM | POA: Insufficient documentation

## 2019-10-31 DIAGNOSIS — Z7984 Long term (current) use of oral hypoglycemic drugs: Secondary | ICD-10-CM | POA: Diagnosis not present

## 2019-10-31 DIAGNOSIS — Z8349 Family history of other endocrine, nutritional and metabolic diseases: Secondary | ICD-10-CM | POA: Insufficient documentation

## 2019-10-31 DIAGNOSIS — Z7982 Long term (current) use of aspirin: Secondary | ICD-10-CM | POA: Diagnosis not present

## 2019-10-31 DIAGNOSIS — Z6838 Body mass index (BMI) 38.0-38.9, adult: Secondary | ICD-10-CM | POA: Insufficient documentation

## 2019-10-31 DIAGNOSIS — E669 Obesity, unspecified: Secondary | ICD-10-CM | POA: Diagnosis not present

## 2019-10-31 DIAGNOSIS — E0590 Thyrotoxicosis, unspecified without thyrotoxic crisis or storm: Secondary | ICD-10-CM

## 2019-10-31 DIAGNOSIS — K089 Disorder of teeth and supporting structures, unspecified: Secondary | ICD-10-CM

## 2019-10-31 DIAGNOSIS — I471 Supraventricular tachycardia: Secondary | ICD-10-CM

## 2019-10-31 DIAGNOSIS — E559 Vitamin D deficiency, unspecified: Secondary | ICD-10-CM | POA: Diagnosis not present

## 2019-10-31 LAB — POCT GLYCOSYLATED HEMOGLOBIN (HGB A1C): HbA1c, POC (controlled diabetic range): 6 % (ref 0.0–7.0)

## 2019-10-31 MED ORDER — ATORVASTATIN CALCIUM 20 MG PO TABS: 20.0000 mg | ORAL_TABLET | Freq: Every day | ORAL | 1 refills | Status: DC

## 2019-10-31 MED ORDER — METFORMIN HCL 500 MG PO TABS: 500.0000 mg | ORAL_TABLET | Freq: Every day | ORAL | 1 refills | Status: DC

## 2019-10-31 MED ORDER — METHIMAZOLE 5 MG PO TABS: 5.0000 mg | ORAL_TABLET | Freq: Every day | ORAL | 3 refills | Status: DC

## 2019-10-31 MED ORDER — HYDROCHLOROTHIAZIDE 25 MG PO TABS: 12.5000 mg | ORAL_TABLET | Freq: Every day | ORAL | 1 refills | Status: DC

## 2019-10-31 MED FILL — METFORMIN HCL 500 MG TABS: 500 | 90 days supply | Qty: 90 | Fill #0

## 2019-10-31 MED FILL — HYDROCHLOROTHIAZIDE 25 MG T: 25 | 90 days supply | Qty: 45 | Fill #0

## 2019-10-31 MED FILL — ATORVASTATIN CALCIUM 20 MG: 20 | 90 days supply | Qty: 90 | Fill #0

## 2019-10-31 NOTE — Progress Notes (Signed)
Subjective:  Patient ID: Kayla Preston, female    DOB: 1950/12/29  Age: 69 y.o. MRN: [id number]  CC: Hypertension and Hypothyroidism   HPI Kayla Preston is a 69 year old female with a history of prediabetes (A1c 6.0), hypertension, hyperthyroidism previous history of SVT, history of COVID-19 in 03/2019 (status post monoclonal antibody infusion) seenfor a follow up visit. She sometimes has a brain fog, she still notices some slight dyspnea but otherwise states she is doing well.  She has been out of Tapazole for 2 months; previously followed by endocrinology whom she last saw in 09/2018 and was supposed to follow-up in 2 months but she states she was not aware. She gained weight due to being sedentary with the pandemic and is requesting something for weight loss. Compliant with her antihypertensive. For her SVT she is followed by cardiology with her last visit in 05/2019 and she is asymptomatic from a cardiac standpoint. Requests referral to her dentist as she has lost multiple teeth and would like to be evaluated for dentures. Past Medical History:  Diagnosis Date  . Atrial fibrillation ([place])   . Atrial fibrillation ([place])    she had ablation which resolved intermittent afib  . Hypertension   . Obesity   . Seasonal allergies     Past Surgical History:  Procedure Laterality Date  . ABLATION      Family History  Problem Relation Age of Onset  . Heart disease Mother   . Heart disease Father   . Hyperlipidemia Sister   . Thyroid disease Neg Hx     No Known Allergies  Outpatient Medications Prior to Visit  Medication Sig Dispense Refill  . aspirin EC 81 MG tablet Take 81 mg by mouth daily.    Marland Kitchen atorvastatin (LIPITOR) 20 MG tablet Take 1 tablet (20 mg total) by mouth daily. 30 tablet 3  . Cyanocobalamin (VITAMIN B 12 PO) Take 1 tablet by mouth daily. Reported on 07/22/2015    . hydrochlorothiazide (HYDRODIURIL) 25 MG tablet Take 0.5 tablets (12.5 mg total) by mouth daily.  90 tablet 1  . LINZESS 145 MCG CAPS capsule Take 1 capsule by mouth daily.  2  . methimazole (TAPAZOLE) 5 MG tablet TAKE 1 TABLET (5 MG TOTAL) BY MOUTH DAILY. 30 tablet 3  . metoprolol succinate (TOPROL-XL) 25 MG 24 hr tablet TAKE 1 TABLET (25 MG TOTAL) BY MOUTH DAILY. 90 tablet 3  . Misc. Devices MISC Blood Pressure monitor. Dx: Hypertension 1 each 0  . Vitamin D, Ergocalciferol, (DRISDOL) 1.25 MG (50000 UNIT) CAPS capsule TAKE 1 CAPSULE (50,000 UNITS TOTAL) BY MOUTH EVERY 7 (SEVEN) DAYS. 4 capsule 0   No facility-administered medications prior to visit.     ROS Review of Systems  Constitutional: Negative for activity change, appetite change and fatigue.  HENT: Negative for congestion, sinus pressure and sore throat.   Eyes: Negative for visual disturbance.  Respiratory: Negative for cough, chest tightness, shortness of breath and wheezing.   Cardiovascular: Negative for chest pain and palpitations.  Gastrointestinal: Negative for abdominal distention, abdominal pain and constipation.  Endocrine: Negative for polydipsia.  Genitourinary: Negative for dysuria and frequency.  Musculoskeletal: Negative for arthralgias and back pain.  Skin: Negative for rash.  Neurological: Negative for tremors, light-headedness and numbness.  Hematological: Does not bruise/bleed easily.  Psychiatric/Behavioral: Negative for agitation and behavioral problems.    Objective:  BP 122/62   Pulse 86   Ht _0  (1.651 m)   Wt 231 lb 9.6 oz (105.1 kg)  SpO2 98%   BMI 38.54 kg/m   BP/Weight 10/31/2019 05/22/2019 13/06/4399  Systolic BP 027 253 664  Diastolic BP 62 88 74  Wt. (Lbs) 231.6 216 -  BMI 38.54 35.94 -      Physical Exam Constitutional:      Appearance: She is well-developed.  Neck:     Vascular: No JVD.  Cardiovascular:     Rate and Rhythm: Normal rate.     Heart sounds: Normal heart sounds. No murmur heard.   Pulmonary:     Effort: Pulmonary effort is normal.     Breath sounds:  Normal breath sounds. No wheezing or rales.  Chest:     Chest wall: No tenderness.  Abdominal:     General: Bowel sounds are normal. There is no distension.     Palpations: Abdomen is soft. There is no mass.     Tenderness: There is no abdominal tenderness.  Musculoskeletal:        General: Normal range of motion.     Right lower leg: No edema.     Left lower leg: No edema.  Neurological:     Mental Status: She is alert and oriented to person, place, and time.  Psychiatric:        Mood and Affect: Mood normal.     CMP Latest Ref Rng & Units 04/13/2019 03/13/2019 07/27/2018  Glucose 70 - 99 mg/dL 114(H) 80 83  BUN 8 - 23 mg/dL _0 Creatinine 0.44 - 1.00 mg/dL 0.76 0.79 0.71  Sodium 135 - 145 mmol/L 139 142 142  Potassium 3.5 - 5.1 mmol/L 4.0 4.4 4.2  Chloride 98 - 111 mmol/L 107 102 104  CO2 22 - 32 mmol/L 22 22 19(L)  Calcium 8.9 - 10.3 mg/dL 9.7 10.2 9.7  Total Protein 6.0 - 8.5 g/dL - 7.0 6.8  Total Bilirubin 0.0 - 1.2 mg/dL - 0.2 0.3  Alkaline Phos 39 - 117 IU/L - 108 77  AST 0 - 40 IU/L - 30 23  ALT 0 - 32 IU/L - 29 41(H)    Lipid Panel     Component Value Date/Time   CHOL 229 (H) 03/13/2019 0848   TRIG 109 03/13/2019 0848   HDL 64 03/13/2019 0848   CHOLHDL 3.6 03/13/2019 0848   CHOLHDL 3.4 05/17/2016 1004   VLDL 18 05/17/2016 1004   LDLCALC 146 (H) 03/13/2019 0848    CBC    Component Value Date/Time   WBC 3.3 (L) 04/13/2019 0832   RBC 5.96 (H) 04/13/2019 0832   HGB 13.9 04/13/2019 0832   HGB 12.9 11/18/2017 1010   HCT 44.4 04/13/2019 0832   HCT 41.8 11/18/2017 1010   PLT 229 04/13/2019 0832   PLT 268 11/18/2017 1010   MCV 74.5 (L) 04/13/2019 0832   MCV 77 (L) 11/18/2017 1010   MCH 23.3 (L) 04/13/2019 0832   MCHC 31.3 04/13/2019 0832   RDW 16.2 (H) 04/13/2019 0832   RDW 17.3 (H) 11/18/2017 1010   LYMPHSABS 1.8 03/06/2018 0656   LYMPHSABS 2.2 11/18/2017 1010   MONOABS 0.4 03/06/2018 0656   EOSABS 0.2 03/06/2018 0656   EOSABS 0.1 11/18/2017 1010     BASOSABS 0.0 03/06/2018 0656   BASOSABS 0.0 11/18/2017 1010    Lab Results  Component Value Date   HGBA1C 6.0 (H) 11/18/2017    Assessment & Plan:   1. Prediabetes Stable with A1c of 6.0 We will commence Metformin which will also aid with weight loss Discussed lifestyle modification  to prevent progression to diabetes mellitus - POCT glycosylated hemoglobin (Hb A1C) - metFORMIN (GLUCOPHAGE) 500 MG tablet; Take 1 tablet (500 mg total) by mouth daily with breakfast.  Dispense: 90 tablet; Refill: 1  2. Poor dentition  Ambulatory referral to Dentistry  3. Vitamin D deficiency Completed course of Drisdol We will repeat level - VITAMIN D 25 Hydroxy (Vit-D Deficiency, Fractures)  4. Hyperthyroidism Likely to be uncontrolled as she has been out of Tapazole which I have refilled; no regimen changes if levels are abnormal given she has been out - T4, free - TSH - methimazole (TAPAZOLE) 5 MG tablet; Take 1 tablet (5 mg total) by mouth daily.  Dispense: 30 tablet; Refill: 3  5. Essential hypertension Controlled Counseled on blood pressure goal of less than 130/80, low-sodium, DASH diet, medication compliance, 150 minutes of moderate intensity exercise per week. Discussed medication compliance, adverse effects. - CMP14+EGFR - Lipid panel - hydrochlorothiazide (HYDRODIURIL) 25 MG tablet; Take 0.5 tablets (12.5 mg total) by mouth daily.  Dispense: 90 tablet; Refill: 1  6. Supraventricular tachycardia ([place]) Stable   Return in about 6 months (around 05/01/2020) for Chronic disease management.      [name], MD, FAAFP. [facility] [place], [place]   10/31/2019, 10:54 AM

## 2019-10-31 NOTE — Patient Instructions (Signed)

## 2019-11-01 ENCOUNTER — Other Ambulatory Visit: Payer: Self-pay | Admitting: Family Medicine

## 2019-11-01 LAB — TSH: TSH: 2.29 u[IU]/mL (ref 0.450–4.500)

## 2019-11-01 LAB — CMP14+EGFR
ALT: 33 IU/L — ABNORMAL HIGH (ref 0–32)
AST: 21 IU/L (ref 0–40)
Albumin/Globulin Ratio: 1.5 (ref 1.2–2.2)
Albumin: 4.4 g/dL (ref 3.8–4.8)
Alkaline Phosphatase: 94 IU/L (ref 48–121)
BUN/Creatinine Ratio: 22 (ref 12–28)
BUN: 17 mg/dL (ref 8–27)
Bilirubin Total: 0.3 mg/dL (ref 0.0–1.2)
CO2: 20 mmol/L (ref 20–29)
Calcium: 10.2 mg/dL (ref 8.7–10.3)
Chloride: 110 mmol/L — ABNORMAL HIGH (ref 96–106)
Creatinine, Ser: 0.78 mg/dL (ref 0.57–1.00)
GFR calc Af Amer: 92 mL/min/{1.73_m2} (ref >59)
GFR calc non Af Amer: 79 mL/min/{1.73_m2} (ref >59)
Globulin, Total: 2.9 g/dL (ref 1.5–4.5)
Glucose: 92 mg/dL (ref 65–99)
Potassium: 4 mmol/L (ref 3.5–5.2)
Sodium: 143 mmol/L (ref 134–144)
Total Protein: 7.3 g/dL (ref 6.0–8.5)

## 2019-11-01 LAB — LIPID PANEL
Chol/HDL Ratio: 3.1 ratio (ref 0.0–4.4)
Cholesterol, Total: 210 mg/dL — ABNORMAL HIGH (ref 100–199)
HDL: 67 mg/dL (ref >39)
LDL Chol Calc (NIH): 126 mg/dL — ABNORMAL HIGH (ref 0–99)
Triglycerides: 96 mg/dL (ref 0–149)
VLDL Cholesterol Cal: 17 mg/dL (ref 5–40)

## 2019-11-01 LAB — VITAMIN D 25 HYDROXY (VIT D DEFICIENCY, FRACTURES): Vit D, 25-Hydroxy: 26.3 ng/mL — ABNORMAL LOW (ref 30.0–100.0)

## 2019-11-01 LAB — T4, FREE: Free T4: 1.14 ng/dL (ref 0.82–1.77)

## 2019-11-01 MED ORDER — VITAMIN D (ERGOCALCIFEROL) 1.25 MG (50000 UNIT) PO CAPS: 50000.0000 [IU] | ORAL_CAPSULE | ORAL | 0 refills | Status: DC

## 2019-11-01 MED FILL — VIT D2 1.25 MG (50,000 UNIT: 1.25 MG | 28 days supply | Qty: 4 | Fill #0

## 2019-11-05 ENCOUNTER — Telehealth: Payer: Self-pay

## 2019-11-05 NOTE — Telephone Encounter (Signed)
Patient name and DOB has been verified Patient was informed of lab results. Patient had no questions.  

## 2019-11-05 NOTE — Telephone Encounter (Signed)
-----   Message from Hoy Register, MD sent at 11/01/2019 11:07 AM EDT ----- Cholesterol is slightly elevated probably because she had been out of her medications for some time. No regimen change at this time. Vitamin D is low and I have sent a rx for Drisdol to her pharmacy for 12 weeks after which she can continue with OTC vitamin D 1000 units daily. Thyroid is normal.

## 2019-11-08 MED FILL — VIT D2 1.25 MG (50,000 UNIT: 1.25 MG | 28 days supply | Qty: 4 | Fill #0

## 2019-11-30 DIAGNOSIS — Z86010 Personal history of colonic polyps: Secondary | ICD-10-CM | POA: Diagnosis not present

## 2019-11-30 DIAGNOSIS — K648 Other hemorrhoids: Secondary | ICD-10-CM | POA: Diagnosis not present

## 2019-11-30 DIAGNOSIS — K644 Residual hemorrhoidal skin tags: Secondary | ICD-10-CM | POA: Diagnosis not present

## 2019-12-05 MED FILL — AMOXICILLIN 500 MG CAPSULE: 500 | 1 days supply | Qty: 4 | Fill #0

## 2019-12-06 MED FILL — methIMAzole 5 MG TABS: 5 | 90 days supply | Qty: 90 | Fill #0

## 2019-12-11 MED FILL — VIT D2 1.25 MG (50,000 UNIT: 1.25 MG | 28 days supply | Qty: 4 | Fill #1

## 2019-12-11 MED FILL — AMOXICILLIN 500 MG CAPSULE: 500 | 7 days supply | Qty: 21 | Fill #0

## 2019-12-19 MED FILL — AMOXICILLIN 500 MG CAPSULE: 500 | 7 days supply | Qty: 21 | Fill #0

## 2020-01-11 MED FILL — LINZESS 145 MCG CAPSULE: 145 | 90 days supply | Qty: 90 | Fill #1

## 2020-01-11 MED FILL — METOPROLOL SUCCINATE ER 25: 25 | 90 days supply | Qty: 90 | Fill #2

## 2020-01-11 MED FILL — VIT D2 1.25 MG (50,000 UNIT: 1.25 MG | 28 days supply | Qty: 4 | Fill #2

## 2020-01-31 MED FILL — HYDROCHLOROTHIAZIDE 25 MG T: 25 | 90 days supply | Qty: 45 | Fill #1

## 2020-01-31 MED FILL — ATORVASTATIN CALCIUM 20 MG: 20 | 90 days supply | Qty: 90 | Fill #1

## 2020-01-31 MED FILL — METFORMIN HCL 500 MG TABS: 500 | 90 days supply | Qty: 90 | Fill #1

## 2020-02-05 ENCOUNTER — Ambulatory Visit: Payer: Self-pay

## 2020-02-12 ENCOUNTER — Ambulatory Visit: Payer: Self-pay | Attending: Internal Medicine

## 2020-02-12 DIAGNOSIS — Z23 Encounter for immunization: Secondary | ICD-10-CM

## 2020-02-12 NOTE — Progress Notes (Signed)
   Covid-19 Vaccination Clinic  Name:  Kayla Preston    MRN: [id number] DOB: 07/05/1950  02/12/2020  [name] was observed post Covid-19 immunization for 15 minutes without incident. She was provided with Vaccine Information Sheet and instruction to access the V-Safe system.   [name] was instructed to call 911 with any severe reactions post vaccine: Marland Kitchen Difficulty breathing  . Swelling of face and throat  . A fast heartbeat  . A bad rash all over body  . Dizziness and weakness

## 2020-02-20 ENCOUNTER — Other Ambulatory Visit: Payer: Self-pay | Admitting: Family Medicine

## 2020-02-20 NOTE — Telephone Encounter (Signed)
Requested medication (s) are due for refill today - yes  Requested medication (s) are on the active medication list -yes  Future visit scheduled -yes  Last refill: 01/11/20  Notes to clinic: Request for non delegated rx  Requested Prescriptions  Pending Prescriptions Disp Refills   Vitamin D, Ergocalciferol, (DRISDOL) 1.25 MG (50000 UNIT) CAPS capsule [Pharmacy Med Name: VIT D2 1.25 MG (50,000 UNIT 1.25 MG Capsule] 4 capsule 0    Sig: Take 1 capsule (50,000 Units total) by mouth every 7 (seven) days.      Endocrinology:  Vitamins - Vitamin D Supplementation Failed - 02/20/2020 11:37 AM      Failed - 50,000 IU strengths are not delegated      Failed - Phosphate in normal range and within 360 days    No results found for: PHOS        Failed - Vitamin D in normal range and within 360 days    Vit D, 25-Hydroxy  Date Value Ref Range Status  10/31/2019 26.3 (L) 30.0 - 100.0 ng/mL Final    Comment:    Vitamin D deficiency has been defined by the Institute of Medicine and an Endocrine Society practice guideline as a level of serum 25-OH vitamin D less than 20 ng/mL (1,2). The Endocrine Society went on to further define vitamin D insufficiency as a level between 21 and 29 ng/mL (2). 1. IOM (Institute of Medicine). 2010. Dietary reference    intakes for calcium and D. [place]: The    [employer]. 2. Holick MF, Binkley Pulpotio Bareas, Bischoff-Ferrari HA, et al.    Evaluation, treatment, and prevention of vitamin D    deficiency: an Endocrine Society clinical practice    guideline. JCEM. 2011 Jul; 96(7):1911-30.           Passed - Ca in normal range and within 360 days    Calcium  Date Value Ref Range Status  10/31/2019 10.2 8.7 - 10.3 mg/dL Final          Passed - Valid encounter within last 12 months    Recent Outpatient Visits           3 months ago Prediabetes   [facility] [place], [place], [place]   11 months ago Need for vaccination  against Streptococcus pneumoniae using pneumococcal conjugate vaccine 13   [facility] [place], Cornelius Moras, RPH-CPP   11 months ago Estrogen deficiency   [facility] [place], Odette Horns, MD   1 year ago Hyperthyroidism   [facility] [place], Odette Horns, MD   1 year ago Acute bilateral low back pain without sciatica   [facility] [place], [place], [place]       Future Appointments             In 2 months Hoy Register, MD [facility]                Requested Prescriptions  Pending Prescriptions Disp Refills   Vitamin D, Ergocalciferol, (DRISDOL) 1.25 MG (50000 UNIT) CAPS capsule [Pharmacy Med Name: VIT D2 1.25 MG (50,000 UNIT 1.25 MG Capsule] 4 capsule 0    Sig: Take 1 capsule (50,000 Units total) by mouth every 7 (seven) days.      Endocrinology:  Vitamins - Vitamin D Supplementation Failed - 02/20/2020 11:37 AM      Failed - 50,000 IU strengths are  not delegated      Failed - Phosphate in normal range and within 360 days    No results found for: PHOS        Failed - Vitamin D in normal range and within 360 days    Vit D, 25-Hydroxy  Date Value Ref Range Status  10/31/2019 26.3 (L) 30.0 - 100.0 ng/mL Final    Comment:    Vitamin D deficiency has been defined by the Institute of Medicine and an Endocrine Society practice guideline as a level of serum 25-OH vitamin D less than 20 ng/mL (1,2). The Endocrine Society went on to further define vitamin D insufficiency as a level between 21 and 29 ng/mL (2). 1. IOM (Institute of Medicine). 2010. Dietary reference    intakes for calcium and D. [place]: The    [employer]. 2. Holick MF, Binkley Cutten, Bischoff-Ferrari HA, et al.    Evaluation, treatment, and prevention of vitamin D    deficiency: an Endocrine Society clinical practice    guideline. JCEM. 2011 Jul;  96(7):1911-30.           Passed - Ca in normal range and within 360 days    Calcium  Date Value Ref Range Status  10/31/2019 10.2 8.7 - 10.3 mg/dL Final          Passed - Valid encounter within last 12 months    Recent Outpatient Visits           3 months ago Prediabetes   [facility] [place], [place], MD   11 months ago Need for vaccination against Streptococcus pneumoniae using pneumococcal conjugate vaccine 13   [facility] [place], Cornelius Moras, RPH-CPP   11 months ago Estrogen deficiency   [facility] Hoy Register, MD   1 year ago Hyperthyroidism   [facility] [place], Odette Horns, MD   1 year ago Acute bilateral low back pain without sciatica   [facility] [place], Marzella [place], [place]       Future Appointments             In 2 months Hoy Register, MD [facility]

## 2020-03-04 ENCOUNTER — Ambulatory Visit: Payer: Self-pay | Admitting: Family Medicine

## 2020-03-04 NOTE — Telephone Encounter (Signed)
Returned patient call and scheduled an appt with PCP for 10/27

## 2020-03-04 NOTE — Telephone Encounter (Signed)
Pt reports fall, occurred Sept. 7th. States tripped and fell on buttocks, back. Reports intermittent pain of left hip "At waist area." States "I thought I should start walking so I started walking every day." States pain resolved "For a while" then occured intermittently  until Saturday, now constant, 9/10 at night, 7/10 during day. HAs not taken anything for pain, has not used ice or heat "Because I don't know what's wrong with it." Made aware no availability at practice.  Declines ED/UC. Requesting Xray order. Assured NT would route to practice for PCPs review.  Care advise given, ED for worsening symptoms, "I won't go there."  Please advise: [phone]  Reason for Disposition  [1] Caller has NON-URGENT question AND [2] triager unable to answer question    Hip area pain S/P fall Sept 7th. Requesting Xray, refuses ED/UC  Answer Assessment - Initial Assessment Questions 1. MECHANISM: "How did the fall happen?"     Tripped 2. DOMESTIC VIOLENCE AND ELDER ABUSE SCREENING: "Did you fall because someone pushed you or tried to hurt you?" If Yes, ask: "Are you safe now?"     No 3. ONSET: "When did the fall happen?" (e.g., minutes, hours, or days ago)     Sept. 7th 4. LOCATION: "What part of the body hit the ground?" (e.g., back, buttocks, head, hips, knees, hands, head, stomach)     Back, buttocks 5. INJURY: "Did you hurt (injure) yourself when you fell?" If Yes, ask: "What did you injure? Tell me more about this?" (e.g., body area; type of injury; pain severity)"     Buttocks and back 6. PAIN: "Is there any pain?" If Yes, ask: "How bad is the pain?" (e.g., Scale 1-10; or mild,  moderate, severe)   - NONE (0): no pain   - MILD (1-3): doesn't interfere with normal activities    - MODERATE (4-7): interferes with normal activities or awakens from sleep    - SEVERE (8-10): excruciating pain, unable to do any normal activities      Night 9/10, after moving around some 7/10. 7. SIZE: For cuts,  bruises, or swelling, ask: "How large is it?" (e.g., inches or centimeters)      unsure 9. OTHER SYMPTOMS: "Do you have any other symptoms?" (e.g., dizziness, fever, weakness; new onset or worsening).     Intermittent, present now and worse 10. CAUSE: "What do you think caused the fall (or falling)?" (e.g., tripped, dizzy spell)       Tripped  Protocols used: FALLS AND FALLING-A-AH

## 2020-03-05 ENCOUNTER — Other Ambulatory Visit: Payer: Self-pay | Admitting: Family Medicine

## 2020-03-05 ENCOUNTER — Ambulatory Visit: Payer: Medicare Other | Attending: Family Medicine | Admitting: Family Medicine

## 2020-03-05 ENCOUNTER — Encounter: Payer: Self-pay | Admitting: Family Medicine

## 2020-03-05 ENCOUNTER — Other Ambulatory Visit: Payer: Self-pay

## 2020-03-05 DIAGNOSIS — G8929 Other chronic pain: Secondary | ICD-10-CM

## 2020-03-05 DIAGNOSIS — M5442 Lumbago with sciatica, left side: Secondary | ICD-10-CM | POA: Diagnosis not present

## 2020-03-05 MED ORDER — METHOCARBAMOL 500 MG PO TABS: 500.0000 mg | ORAL_TABLET | Freq: Three times a day (TID) | ORAL | 1 refills | Status: DC | PRN

## 2020-03-05 MED ORDER — MELOXICAM 7.5 MG PO TABS: 7.5000 mg | ORAL_TABLET | Freq: Every day | ORAL | 1 refills | Status: DC

## 2020-03-05 MED FILL — MELOXICAM 7.5 MG TABLET: 7.5 | 30 days supply | Qty: 30 | Fill #0

## 2020-03-05 NOTE — Progress Notes (Signed)
Patient had a fall in September and now she is having pain in her buttocks.

## 2020-03-05 NOTE — Progress Notes (Signed)
Virtual Visit via Telephone Note  I connected with [name], on 03/05/2020 at 3:15 PM by telephone due to the COVID-19 pandemic and verified that I am speaking with the correct person using two identifiers.   Consent: I discussed the limitations, risks, security and privacy concerns of performing an evaluation and management service by telephone and the availability of in person appointments. I also discussed with the patient that there may be a patient responsible charge related to this service. The patient expressed understanding and agreed to proceed.   Location of Patient: Home  Location of Provider: Clinic   Persons participating in Telemedicine visit: Nailyn Agapito [employer] [place]-CMA Dr. Alvis Lemmings     History of Present Illness: Kayla Preston is a 69 year old female with a history of prediabetes (A1c 6.0), hypertension,hyperthyroidismprevious history of SVT, history of COVID-19 in 03/2019 (status post monoclonal antibody infusion) seenfor a follow up visit.   Has pain in her lower back on the left side. From buttock down to legs and she can't feel her legs sometimes, it also radiates to her waist. She fell last month when reaching out to grab something and she fell on her buttock and couldn't move at the time. It took her 5 minutes to get up and she limped after getting up. Didn't go to the ED due to Covid. Two days after it subsided but after going for a walk pain returned and so she stopped walking. Pain is worse at night and she is unable to move her leg when it occurs and she has to use her hands to move her leg. Pain is sharp and she has to get up at night to work the pain out.  Past Medical History:  Diagnosis Date  . Atrial fibrillation (HCC)   . Atrial fibrillation (HCC)    she had ablation which resolved intermittent afib  . Hypertension   . Obesity   . Seasonal allergies    No Known Allergies  Current Outpatient Medications on File Prior to  Visit  Medication Sig Dispense Refill  . aspirin EC 81 MG tablet Take 81 mg by mouth daily.    Marland Kitchen atorvastatin (LIPITOR) 20 MG tablet Take 1 tablet (20 mg total) by mouth daily. 90 tablet 1  . Cyanocobalamin (VITAMIN B 12 PO) Take 1 tablet by mouth daily. Reported on 07/22/2015    . hydrochlorothiazide (HYDRODIURIL) 25 MG tablet Take 0.5 tablets (12.5 mg total) by mouth daily. 90 tablet 1  . LINZESS 145 MCG CAPS capsule Take 1 capsule by mouth daily.  2  . metFORMIN (GLUCOPHAGE) 500 MG tablet Take 1 tablet (500 mg total) by mouth daily with breakfast. 90 tablet 1  . methimazole (TAPAZOLE) 5 MG tablet Take 1 tablet (5 mg total) by mouth daily. 30 tablet 3  . metoprolol succinate (TOPROL-XL) 25 MG 24 hr tablet TAKE 1 TABLET (25 MG TOTAL) BY MOUTH DAILY. 90 tablet 3  . Misc. Devices MISC Blood Pressure monitor. Dx: Hypertension 1 each 0  . Vitamin D, Ergocalciferol, (DRISDOL) 1.25 MG (50000 UNIT) CAPS capsule Take 1 capsule (50,000 Units total) by mouth every 7 (seven) days. 12 capsule 0   No current facility-administered medications on file prior to visit.    Observations/Objective: Awake, alert, oriented x3 Not in acute distress  Assessment and Plan: 1. Chronic midline low back pain with left-sided sciatica Advised to apply heat We will refer for PT If symptoms persist consider imaging - methocarbamol (ROBAXIN) 500 MG tablet; Take 1 tablet (500 mg total)  by mouth every 8 (eight) hours as needed for muscle spasms.  Dispense: 60 tablet; Refill: 1 - Ambulatory referral to Physical Therapy - meloxicam (MOBIC) 7.5 MG tablet; Take 1 tablet (7.5 mg total) by mouth daily.  Dispense: 30 tablet; Refill: 1   Follow Up Instructions: Keep previously scheduled appointment for chronic disease management   I discussed the assessment and treatment plan with the patient. The patient was provided an opportunity to ask questions and all were answered. The patient agreed with the plan and demonstrated an  understanding of the instructions.   The patient was advised to call back or seek an in-person evaluation if the symptoms worsen or if the condition fails to improve as anticipated.     I provided 14 minutes total of non-face-to-face time during this encounter including median intraservice time, reviewing previous notes, investigations, ordering medications, medical decision making, coordinating care and patient verbalized understanding at the end of the visit.     Hoy Register, MD, FAAFP. [facility] [place], [place] [phone]   03/05/2020, 3:15 PM

## 2020-03-28 ENCOUNTER — Other Ambulatory Visit: Payer: Self-pay

## 2020-03-28 ENCOUNTER — Ambulatory Visit: Payer: Medicare Other | Attending: Family Medicine | Admitting: Physical Therapy

## 2020-03-28 ENCOUNTER — Encounter: Payer: Self-pay | Admitting: Physical Therapy

## 2020-03-28 DIAGNOSIS — M25552 Pain in left hip: Secondary | ICD-10-CM | POA: Diagnosis not present

## 2020-03-28 DIAGNOSIS — M6281 Muscle weakness (generalized): Secondary | ICD-10-CM | POA: Diagnosis not present

## 2020-03-28 DIAGNOSIS — R2689 Other abnormalities of gait and mobility: Secondary | ICD-10-CM | POA: Diagnosis not present

## 2020-03-28 NOTE — Patient Instructions (Signed)
Access Code: [email or web address] URL: [email or web address] Date: 03/28/2020 Prepared by: Rosana Hoes  Exercises Supine Piriformis Stretch with Foot on Ground - 1-2 x daily - 7 x weekly - 3 reps - 20 seconds hold Bridge - 1-2 x daily - 7 x weekly - 2 sets - 10 reps - 3 seconds hold Clamshell with Resistance - 1-2 x daily - 7 x weekly - 2 sets - 15 reps Standing Glute Med Mobilization with Small Ball on Wall - 1-2 minutes hold

## 2020-03-28 NOTE — Therapy (Signed)
[facility] [address] [place], [place], [address] Phone: [phone]   Fax:  [phone]  Physical Therapy Evaluation  Patient Details  Name: Kayla CuffDoris Preston MRN: [id number] Date of Birth: [birth year] Referring Provider (PT): Hoy RegisterNewlin, Enobong, MD   Encounter Date: 03/28/2020   PT End of Session - 03/28/20 0758    Visit Number 1    Number of Visits 6    Date for PT Re-Evaluation 05/09/20    Authorization Type MCR    Authorization Time Period FOTO by 6th visit    Progress Note Due on Visit 10    PT Start Time 0745    PT Stop Time 0828    PT Time Calculation (min) 43 min    Activity Tolerance Patient tolerated treatment well    Behavior During Therapy [facility] for tasks assessed/performed           Past Medical History:  Diagnosis Date  . Atrial fibrillation (HCC)   . Atrial fibrillation (HCC)    she had ablation which resolved intermittent afib  . Hypertension   . Obesity   . Seasonal allergies     Past Surgical History:  Procedure Laterality Date  . ABLATION      There were no vitals filed for this visit.    Subjective Assessment - 03/28/20 0747    Subjective Patient reports she fell 2 times while gardening, one of the times she was reaching and fell on concrete landing on the left hip, and was unable to get up. This happened around the early part of September. For 2 days she just had soreness in the left lower back and hip, but then it started to get really bad. Currenttly, every now and then her left lower back and hip will really bother her especially at night, and when she is getting out of bed she has to slide out and hop for a bit. Pain is sharp when it is bad.    Limitations Sitting;Standing;Walking;House hold activities    How long can you sit comfortably? No limitation    How long can you stand comfortably? 30 minutes    How long can you walk comfortably? 30 minutes    Diagnostic tests None    Patient  Stated Goals Get rid of pain for good    Currently in Pain? Yes    Pain Score 1    9-10/10 at worst   Pain Location Hip    Pain Orientation Left;Posterior    Pain Descriptors / Indicators Sharp    Pain Type Acute pain    Pain Radiating Towards upper gluteal region    Pain Onset More than a month ago    Pain Frequency Intermittent    Aggravating Factors  Walking extended periods, otherwise nothing specific causes pain because it just happens    Pain Relieving Factors Walking a little bit to loosen it up    Effect of Pain on Daily Activities Limited movement when is painful              [facility] PT Assessment - 03/28/20 0001      Assessment   Medical Diagnosis Chronic midline low back pain with left-sided sciatica    Referring Provider (PT) Hoy RegisterNewlin, Enobong, MD    Onset Date/Surgical Date 01/21/20   patient reports 2nd week of September   Next MD Visit 04/23/2020    Prior Therapy None      Precautions   Precautions None      Restrictions  Weight Bearing Restrictions No      Balance Screen   Has the patient fallen in the past 6 months Yes    How many times? 2    Has the patient had a decrease in activity level because of a fear of falling?  No    Is the patient reluctant to leave their home because of a fear of falling?  No      Prior Function   Level of Independence Independent    Leisure Walking      Cognition   Overall Cognitive Status Within Functional Limits for tasks assessed      Observation/Other Assessments   Observations Patient appears in no apparent distress    Focus on Therapeutic Outcomes (FOTO)  65% functional status      Sensation   Light Touch Appears Intact      Coordination   Gross Motor Movements are Fluid and Coordinated Yes      Posture/Postural Control   Posture Comments Slight increase lumbar lordosis      ROM / Strength   AROM / PROM / Strength AROM;PROM;Strength      AROM   Overall AROM Comments Patient reports slight discomfort with  extension, flexion felt better    AROM Assessment Site Lumbar    Lumbar Flexion WFL    Lumbar Extension WFL    Lumbar - Right Side Bend WFL    Lumbar - Left Side Bend WFL    Lumbar - Right Rotation WFL    Lumbar - Left Rotation WFL      PROM   Overall PROM Comments Hip PROM grossly WFL and non-painful      Strength   Strength Assessment Site Hip;Knee    Right/Left Hip Right;Left    Right Hip Flexion 4/5    Right Hip Extension 4-/5    Right Hip ABduction 4-/5    Left Hip Flexion 4/5    Left Hip Extension 3+/5    Left Hip ABduction 4-/5    Right/Left Knee Right;Left    Right Knee Flexion 5/5    Right Knee Extension 5/5    Left Knee Flexion 5/5    Left Knee Extension 5/5      Flexibility   Soft Tissue Assessment /Muscle Length yes    Hamstrings WFL    Piriformis Patient reports concordant "stretch" on left      Palpation   Spinal mobility Not assessed    SI assessment  Not assessed    Palpation comment TTP left upper gluteal region      Special Tests   Other special tests Lumbar radicular testing negtive      Transfers   Transfers Independent with all Transfers      Ambulation/Gait   Ambulation/Gait Yes    Ambulation/Gait Assistance 7: Independent    Gait Comments Slight antalgic and trendelenburg on left                      Objective measurements completed on examination: See above findings.       [facility] Adult PT Treatment/Exercise - 03/28/20 0001      Exercises   Exercises Knee/Hip      Knee/Hip Exercises: Stretches   Piriformis Stretch 2 reps;20 seconds    Piriformis Stretch Limitations supine figure-4 left only    Other Knee/Hip Stretches STM using ball for upper gluteal region      Knee/Hip Exercises: Supine   Bridges 10 reps   3 sec hold  Knee/Hip Exercises: Sidelying   Clams x 15 with red band                  PT Education - 03/28/20 0757    Education Details Exam findings, POC, HEP    Person(s) Educated Patient     Methods Explanation;Demonstration;Tactile cues;Verbal cues;Handout    Comprehension Verbalized understanding;Returned demonstration;Verbal cues required;Tactile cues required;Need further instruction            PT Short Term Goals - 03/28/20 0758      PT SHORT TERM GOAL #1   Title Patient will be I with initial HEP to progress with PT    Time 3    Period Weeks    Status New    Target Date 04/18/20      PT SHORT TERM GOAL #2   Title PT will review FOTO results with patient by 3rd visit    Time 3    Period Weeks    Status New    Target Date 04/18/20             PT Long Term Goals - 03/28/20 0759      PT LONG TERM GOAL #1   Title Patient will be I with final HEP to maintain progress from PT    Time 6    Period Weeks    Status New    Target Date 05/09/20      PT LONG TERM GOAL #2   Title Patient will report improved functional status >/=  77% on FOTO    Time 6    Period Weeks    Status New    Target Date 05/09/20      PT LONG TERM GOAL #3   Title Patient will demonstrate gross hip and core strength >/= 4+/5 MMT in order to improve walking tolerance    Time 6    Period Weeks    Status New    Target Date 05/09/20      PT LONG TERM GOAL #4   Title Patient will report ability to walk for exercise without residual pain or limitation    Time 6    Period Weeks    Status New    Target Date 05/09/20                  Plan - 03/28/20 5277    Clinical Impression Statement Patient presents to PT with report of subacute left posterior hip pain following a fall. Pain occasionally occurs mainly at night and there are no specific aggravating factors. Her symptoms seem muscular in nature at this time. She does exhibit gross hip/core weakness and tendness over the left posterior hip/gluteal region. She was provided with exercises to initiate stretching and strengthening for her hip and patient would benefit from continued skilled PT to reduce pain and improve her walking  ability.    Personal Factors and Comorbidities Age;Fitness;Time since onset of injury/illness/exacerbation    Examination-Activity Limitations Locomotion Level;Stand;Lift    Examination-Participation Restrictions Community Activity;Shop    Stability/Clinical Decision Making Stable/Uncomplicated    Clinical Decision Making Low    Rehab Potential Good    PT Frequency 1x / week    PT Duration 6 weeks    PT Treatment/Interventions ADLs/Self Care Home Management;Cryotherapy;Electrical Stimulation;Iontophoresis 4mg /ml Dexamethasone;Moist Heat;Traction;Ultrasound;Neuromuscular re-education;Balance training;Therapeutic exercise;Therapeutic activities;Functional mobility training;Stair training;Gait training;Patient/family education;Manual techniques;Dry needling;Passive range of motion;Taping;Spinal Manipulations;Joint Manipulations    PT Next Visit Plan Review HEP and progress PRN, manual and stretching for left posterior  hip, progress hip and core strengthening    PT Home Exercise Plan [email or web address]: piriformis stretch, bridge, clamshell with red, STM using tennis ball    Consulted and Agree with Plan of Care Patient           Patient will benefit from skilled therapeutic intervention in order to improve the following deficits and impairments:  Decreased strength, Pain, Difficulty walking, Decreased activity tolerance, Impaired flexibility  Visit Diagnosis: Pain in left hip  Muscle weakness (generalized)  Other abnormalities of gait and mobility     Problem List Patient Active Problem List   Diagnosis Date Noted  . Lab test positive for detection of COVID-19 virus 04/14/2019  . Hyperthyroidism 09/13/2018  . Viral URI with cough 03/13/2018  . Moderate persistent asthma without complication 03/13/2018  . Seasonal allergies 09/13/2017  . Essential hypertension 06/15/2017  . Cervical polyp 06/16/2016  . Anemia, iron deficiency 07/22/2015  . Awareness of heartbeats 07/05/2011  . Bundle  branch block, right 07/05/2011  . Supraventricular tachycardia (HCC) 07/05/2011    Rosana Hoes, PT, DPT, LAT, ATC 03/28/20  9:37 AM Phone: [phone] Fax: [phone]   [facility] [address] [place], [place], [address] Phone: [phone]   Fax:  [phone]  Name: Kayla Preston MRN: [id number] Date of Birth: 1950-11-24

## 2020-04-11 MED FILL — METOPROLOL SUCCINATE ER 25: 25 | 90 days supply | Qty: 90 | Fill #3

## 2020-04-11 MED FILL — LINZESS 145 MCG CAPSULE: 145 | 90 days supply | Qty: 90 | Fill #2

## 2020-04-14 ENCOUNTER — Encounter: Payer: Self-pay | Admitting: Physical Therapy

## 2020-04-14 ENCOUNTER — Ambulatory Visit: Payer: Medicare Other | Attending: Family Medicine | Admitting: Physical Therapy

## 2020-04-14 ENCOUNTER — Other Ambulatory Visit: Payer: Self-pay

## 2020-04-14 DIAGNOSIS — M25552 Pain in left hip: Secondary | ICD-10-CM

## 2020-04-14 DIAGNOSIS — M6281 Muscle weakness (generalized): Secondary | ICD-10-CM

## 2020-04-14 DIAGNOSIS — R2689 Other abnormalities of gait and mobility: Secondary | ICD-10-CM | POA: Diagnosis not present

## 2020-04-14 NOTE — Therapy (Addendum)
[place], [place], [address] Phone: [phone]   Fax:  [phone]  Physical Therapy Treatment  Patient Details  Name: Kayla Preston MRN: [id number] Date of Birth: 1951/04/20 Referring Provider (PT): [name], MD   Encounter Date: 04/14/2020   PT End of Session - 04/14/20 0720    Visit Number 2    Number of Visits 6    Date for PT Re-Evaluation 05/09/20    Authorization Type MCR    Authorization Time Period FOTO by 6th visit    PT Start Time 0716    PT Stop Time 0800    PT Time Calculation (min) 44 min           Past Medical History:  Diagnosis Date  . Atrial fibrillation ([place])   . Atrial fibrillation ([place])    she had ablation which resolved intermittent afib  . Hypertension   . Obesity   . Seasonal allergies     Past Surgical History:  Procedure Laterality Date  . ABLATION      There were no vitals filed for this visit.   Subjective Assessment - 04/14/20 0724    Subjective I like using the ball for massage. The pain is not as bad as it wall. I like the stretch too.    Currently in Pain? No/denies                             [facility] Adult PT Treatment/Exercise - 04/14/20 0001      Knee/Hip Exercises: Stretches   Piriformis Stretch 2 reps;20 seconds    Piriformis Stretch Limitations pull x 3 each       Knee/Hip Exercises: Aerobic   Nustep L4 LE only a 5 minutes       Knee/Hip Exercises: Supine   Bridges 10 reps    Bridges Limitations 2 sets     Other Supine Knee/Hip Exercises supine marching -cued for abdominal draw in and breathing       Knee/Hip Exercises: Sidelying   Hip ABduction 10 reps;2 sets    Clams x 15 with red band   2 sets, bilat     Knee/Hip Exercises: Prone   Other Prone Exercises prone gluteal squeeze     Other Prone Exercises Prone donkey kicks                   PT Education - 04/14/20 0808    Education Details FOTO  score and predictions    Person(s) Educated Patient    Methods Explanation;Handout    Comprehension Verbalized understanding            PT Short Term Goals - 04/14/20 0807      PT SHORT TERM GOAL #1   Title Patient will be I with initial HEP to progress with PT    Time 3    Period Weeks    Status On-going      PT SHORT TERM GOAL #2   Title PT will review FOTO results with patient by 3rd visit    Time 3    Period Weeks    Status Achieved             PT Long Term Goals - 03/28/20 0759      PT LONG TERM GOAL #1   Title Patient will be I with final HEP to maintain progress from PT    Time 6  Period Weeks    Status New    Target Date 05/09/20      PT LONG TERM GOAL #2   Title Patient will report improved functional status >/=  77% on FOTO    Time 6    Period Weeks    Status New    Target Date 05/09/20      PT LONG TERM GOAL #3   Title Patient will demonstrate gross hip and core strength >/= 4+/5 MMT in order to improve walking tolerance    Time 6    Period Weeks    Status New    Target Date 05/09/20      PT LONG TERM GOAL #4   Title Patient will report ability to walk for exercise without residual pain or limitation    Time 6    Period Weeks    Status New    Target Date 05/09/20                 Plan - 04/14/20 0759    Clinical Impression Statement Pt reports decreased pain with use of tennis ball for self massage and her HEP. Reviewed HEP and progressed with core and hip strength. Updated HEP. FOTO score and predictions reviewed with patient. STG#2 met.    PT Next Visit Plan Review HEP and progress PRN, manual and stretching for left posterior hip, progress hip and core strengthening    PT Home Exercise Plan PCPQMYYD: piriformis stretch, bridge, clamshell with red, STM using tennis ball, added hip abduction, prone hip ext with knee flexed, supine march           Patient will benefit from skilled therapeutic intervention in order to improve the  following deficits and impairments:  Decreased strength, Pain, Difficulty walking, Decreased activity tolerance, Impaired flexibility  Visit Diagnosis: Pain in left hip  Muscle weakness (generalized)  Other abnormalities of gait and mobility     Problem List Patient Active Problem List   Diagnosis Date Noted  . Lab test positive for detection of COVID-19 virus 04/14/2019  . Hyperthyroidism 09/13/2018  . Viral URI with cough 03/13/2018  . Moderate persistent asthma without complication 61/16/4353  . Seasonal allergies 09/13/2017  . Essential hypertension 06/15/2017  . Cervical polyp 06/16/2016  . Anemia, iron deficiency 07/22/2015  . Awareness of heartbeats 07/05/2011  . Bundle branch block, right 07/05/2011  . Supraventricular tachycardia ([place]) 07/05/2011    Dorene Ar, PTA 04/14/2020, 8:08 AM  [facility] [address] [place], [place], [address] Phone: [phone]   Fax:  [phone]  Name: Kayla Preston MRN: [id number] Date of Birth: 22-Sep-1950

## 2020-04-21 ENCOUNTER — Other Ambulatory Visit: Payer: Self-pay

## 2020-04-21 ENCOUNTER — Ambulatory Visit: Payer: Medicare Other | Admitting: Physical Therapy

## 2020-04-21 ENCOUNTER — Encounter: Payer: Self-pay | Admitting: Physical Therapy

## 2020-04-21 DIAGNOSIS — M25552 Pain in left hip: Secondary | ICD-10-CM

## 2020-04-21 DIAGNOSIS — M6281 Muscle weakness (generalized): Secondary | ICD-10-CM | POA: Diagnosis not present

## 2020-04-21 DIAGNOSIS — R2689 Other abnormalities of gait and mobility: Secondary | ICD-10-CM

## 2020-04-21 NOTE — Therapy (Signed)
[facility] [address] [place], [place], [address] Phone: [phone]   Fax:  [phone]  Physical Therapy Treatment  Patient Details  Name: Kayla Preston MRN: [id number] Date of Birth: 1950/07/03 Referring Provider (PT): Hoy Register, MD   Encounter Date: 04/21/2020   PT End of Session - 04/21/20 0809    Visit Number 3    Number of Visits 6    Date for PT Re-Evaluation 05/09/20    Authorization Type MCR    Authorization Time Period FOTO by 6th visit    Progress Note Due on Visit 10    PT Start Time 0722    PT Stop Time 0800    PT Time Calculation (min) 38 min           Past Medical History:  Diagnosis Date  . Atrial fibrillation ([facility])   . Atrial fibrillation ([facility])    she had ablation which resolved intermittent afib  . Hypertension   . Obesity   . Seasonal allergies     Past Surgical History:  Procedure Laterality Date  . ABLATION      There were no vitals filed for this visit.   Subjective Assessment - 04/21/20 0728    Subjective no pain at all. I feel great.    Currently in Pain? No/denies                             [facility] Adult PT Treatment/Exercise - 04/21/20 0001      Knee/Hip Exercises: Stretches   Piriformis Stretch 2 reps;20 seconds    Piriformis Stretch Limitations pull x 3 each       Knee/Hip Exercises: Aerobic   Nustep L4 LE only a 7 minutes      Knee/Hip Exercises: Standing   Forward Step Up 15 reps;Hand Hold: 1;Step Height: 6"    Forward Step Up Limitations with opp knee drive    Other Standing Knee Exercises squats at free motion bar x 20      Knee/Hip Exercises: Seated   Sit to Sand 10 reps   with UE, low bariatric chair     Knee/Hip Exercises: Supine   Bridges 10 reps    Bridges Limitations 2 sets     Bridges with Clamshell 10 reps   green   Other Supine Knee/Hip Exercises supine marching -cued for abdominal draw in and breathing       Knee/Hip  Exercises: Sidelying   Hip ABduction 10 reps;2 sets    Hip ABduction Limitations green band    Clams x20 green band                    PT Short Term Goals - 04/14/20 6606      PT SHORT TERM GOAL #1   Title Patient will be I with initial HEP to progress with PT    Time 3    Period Weeks    Status On-going      PT SHORT TERM GOAL #2   Title PT will review FOTO results with patient by 3rd visit    Time 3    Period Weeks    Status Achieved             PT Long Term Goals - 03/28/20 0759      PT LONG TERM GOAL #1   Title Patient will be I with final HEP to maintain progress from PT    Time  6    Period Weeks    Status New    Target Date 05/09/20      PT LONG TERM GOAL #2   Title Patient will report improved functional status >/=  77% on FOTO    Time 6    Period Weeks    Status New    Target Date 05/09/20      PT LONG TERM GOAL #3   Title Patient will demonstrate gross hip and core strength >/= 4+/5 MMT in order to improve walking tolerance    Time 6    Period Weeks    Status New    Target Date 05/09/20      PT LONG TERM GOAL #4   Title Patient will report ability to walk for exercise without residual pain or limitation    Time 6    Period Weeks    Status New    Target Date 05/09/20                 Plan - 04/21/20 0729    Clinical Impression Statement Pt reports she is walking without pain and resting better at night as well as improved transitions in bed and out of bed. She is walking for exercise without pain or limitation. She has made great progress toward her LTGS.    PT Next Visit Plan Check FOTO and MMT, possible Discharge.    PT Home Exercise Plan PCPQMYYD: piriformis stretch, bridge, clamshell with red, STM using tennis ball, added hip abduction, prone hip ext with knee flexed, supine march           Patient will benefit from skilled therapeutic intervention in order to improve the following deficits and impairments:  Decreased  strength,Pain,Difficulty walking,Decreased activity tolerance,Impaired flexibility  Visit Diagnosis: Pain in left hip  Other abnormalities of gait and mobility  Muscle weakness (generalized)     Problem List Patient Active Problem List   Diagnosis Date Noted  . Lab test positive for detection of COVID-19 virus 04/14/2019  . Hyperthyroidism 09/13/2018  . Viral URI with cough 03/13/2018  . Moderate persistent asthma without complication 03/13/2018  . Seasonal allergies 09/13/2017  . Essential hypertension 06/15/2017  . Cervical polyp 06/16/2016  . Anemia, iron deficiency 07/22/2015  . Awareness of heartbeats 07/05/2011  . Bundle branch block, right 07/05/2011  . Supraventricular tachycardia ([facility]) 07/05/2011    Sherrie Mustache, PTA 04/21/2020, 8:16 AM  [facility] [address] [place], [place], [address] Phone: [phone]   Fax:  [phone]  Name: Kayla Preston MRN: [id number] Date of Birth: 1950-10-31

## 2020-04-23 ENCOUNTER — Ambulatory Visit: Payer: Medicare Other | Admitting: Family Medicine

## 2020-04-29 ENCOUNTER — Other Ambulatory Visit: Payer: Self-pay | Admitting: Family Medicine

## 2020-04-29 ENCOUNTER — Ambulatory Visit: Payer: Medicare Other | Admitting: Physical Therapy

## 2020-04-29 ENCOUNTER — Encounter: Payer: Self-pay | Admitting: Physical Therapy

## 2020-04-29 ENCOUNTER — Other Ambulatory Visit: Payer: Self-pay

## 2020-04-29 DIAGNOSIS — M25552 Pain in left hip: Secondary | ICD-10-CM

## 2020-04-29 DIAGNOSIS — R2689 Other abnormalities of gait and mobility: Secondary | ICD-10-CM | POA: Diagnosis not present

## 2020-04-29 DIAGNOSIS — M6281 Muscle weakness (generalized): Secondary | ICD-10-CM | POA: Diagnosis not present

## 2020-04-29 DIAGNOSIS — R7303 Prediabetes: Secondary | ICD-10-CM

## 2020-04-29 MED FILL — METFORMIN HCL 500 MG TABS: 500 | 90 days supply | Qty: 90 | Fill #0

## 2020-04-29 MED FILL — ATORVASTATIN CALCIUM 20 MG: 20 | 90 days supply | Qty: 90 | Fill #0

## 2020-04-29 NOTE — Therapy (Signed)
[place], [place], [address] Phone: [phone]   Fax:  [phone]  Physical Therapy Treatment / Discharge  Patient Details  Name: Kayla Preston MRN: [id number] Date of Birth: [birth year] Referring Provider (PT): [name], MD   Encounter Date: 04/29/2020   PT End of Session - 04/29/20 0843    Visit Number 4    Number of Visits 6    Date for PT Re-Evaluation 05/09/20    Authorization Type MCR    Progress Note Due on Visit 10    PT Start Time 0833    PT Stop Time 0911    PT Time Calculation (min) 38 min    Activity Tolerance Patient tolerated treatment well    Behavior During Therapy [facility] for tasks assessed/performed           Past Medical History:  Diagnosis Date  . Atrial fibrillation ([place])   . Atrial fibrillation ([place])    she had ablation which resolved intermittent afib  . Hypertension   . Obesity   . Seasonal allergies     Past Surgical History:  Procedure Laterality Date  . ABLATION      There were no vitals filed for this visit.   Subjective Assessment - 04/29/20 0839    Subjective Patient reports she is still feeling great. She feels like she is ready to discharge from PT.    How long can you sit comfortably? No limitation    How long can you stand comfortably? No limitation    How long can you walk comfortably? No limitation    Patient Stated Goals Get rid of pain for good    Currently in Pain? No/denies              [facility] PT Assessment - 04/29/20 0001      Assessment   Medical Diagnosis Chronic midline low back pain with left-sided sciatica    Referring Provider (PT) [name], MD      Precautions   Precautions None      Restrictions   Weight Bearing Restrictions No      Balance Screen   Has the patient fallen in the past 6 months Yes    How many times? No falls reported since start of therapy    Has the patient had a decrease in activity level  because of a fear of falling?  No    Is the patient reluctant to leave their home because of a fear of falling?  No      Prior Function   Level of Independence Independent      Observation/Other Assessments   Focus on Therapeutic Outcomes (FOTO)  94% functional status      AROM   Overall AROM Comments Patient exhibits lumbar AROM grossly WFL and non-painful      PROM   Overall PROM Comments Hip PROM grossly WFL and non-painful      Strength   Right Hip Flexion 4/5    Right Hip Extension 4/5    Right Hip ABduction 4/5    Left Hip Flexion 4/5    Left Hip Extension 4/5    Left Hip ABduction 4/5      Ambulation/Gait   Gait Pattern Within Functional Limits                         [facility] Adult PT Treatment/Exercise - 04/29/20 0001      Self-Care   Self-Care  Other Self-Care Comments    Other Self-Care Comments  FOTO, exam findings, POC discharge, HEP      Exercises   Exercises Knee/Hip      Knee/Hip Exercises: Stretches   Piriformis Stretch 2 reps;30 seconds      Knee/Hip Exercises: Aerobic   Nustep L5 x 6 min with LE/UE      Knee/Hip Exercises: Seated   Sit to Sand 10 reps      Knee/Hip Exercises: Supine   Bridges 10 reps   3 sec hold   Other Supine Knee/Hip Exercises Marching  x 20      Knee/Hip Exercises: Sidelying   Hip ABduction 15 reps    Clams x 20 with green band      Knee/Hip Exercises: Prone   Other Prone Exercises Bent knee hip extension x 15                  PT Education - 04/29/20 0842    Education Details FOTO, POC discharge, HEP    Person(s) Educated Patient    Methods Explanation    Comprehension Verbalized understanding            PT Short Term Goals - 04/29/20 0846      PT SHORT TERM GOAL #1   Title Patient will be I with initial HEP to progress with PT    Time 3    Period Weeks    Status Achieved      PT SHORT TERM GOAL #2   Title PT will review FOTO results with patient by 3rd visit    Time 3    Period  Weeks    Status Achieved             PT Long Term Goals - 04/29/20 8871      PT LONG TERM GOAL #1   Title Patient will be I with final HEP to maintain progress from PT    Time 6    Period Weeks    Status Achieved      PT LONG TERM GOAL #2   Title Patient will report improved functional status >/=  77% on FOTO    Time 6    Period Weeks    Status Achieved      PT LONG TERM GOAL #3   Title Patient will demonstrate gross hip and core strength >/= 4+/5 MMT in order to improve walking tolerance    Baseline Hip strength grossly 4/5 MMT    Time 6    Period Weeks    Status Partially Met      PT LONG TERM GOAL #4   Title Patient will report ability to walk for exercise without residual pain or limitation    Time 6    Period Weeks    Status Achieved                 Plan - 04/29/20 0843    Clinical Impression Statement Patient has achieved all goals except strength, which she has improved since initial eval. Her functional level has greatly improved via FOTO and patient reports she is able to do more activities and walking now than she was before. Patient will be discharged from PT.    PT Treatment/Interventions ADLs/Self Care Home Management;Cryotherapy;Electrical Stimulation;Iontophoresis 29m/ml Dexamethasone;Moist Heat;Traction;Ultrasound;Neuromuscular re-education;Balance training;Therapeutic exercise;Therapeutic activities;Functional mobility training;Stair training;Gait training;Patient/family education;Manual techniques;Dry needling;Passive range of motion;Taping;Spinal Manipulations;Joint Manipulations    PT Next Visit Plan NA - discharge    [place]  Consulted and Agree with Plan of Care Patient           Patient will benefit from skilled therapeutic intervention in order to improve the following deficits and impairments:  Decreased strength  Visit Diagnosis: Pain in left hip  Other abnormalities of gait and mobility  Muscle weakness  (generalized)     Problem List Patient Active Problem List   Diagnosis Date Noted  . Lab test positive for detection of COVID-19 virus 04/14/2019  . Hyperthyroidism 09/13/2018  . Viral URI with cough 03/13/2018  . Moderate persistent asthma without complication 81/15/7262  . Seasonal allergies 09/13/2017  . Essential hypertension 06/15/2017  . Cervical polyp 06/16/2016  . Anemia, iron deficiency 07/22/2015  . Awareness of heartbeats 07/05/2011  . Bundle branch block, right 07/05/2011  . Supraventricular tachycardia ([place]) 07/05/2011    Hilda Blades, PT, DPT, LAT, ATC 04/29/20  9:16 AM Phone: [phone] Fax: [facility] [address] [place], [place], [address] Phone: [phone]   Fax:  [phone]  Name: Kayla Preston MRN: [id number] Date of Birth: 1950-09-04   PHYSICAL THERAPY DISCHARGE SUMMARY  Visits from Start of Care: 4  Current functional level related to goals / functional outcomes: See above   Remaining deficits: See above   Education / Equipment: HEP Plan: Patient agrees to discharge.  Patient goals were partially met. Patient is being discharged due to being pleased with the current functional level.  ?????

## 2020-04-29 NOTE — Patient Instructions (Signed)
Access Code: [email or web address] URL: [email or web address] Date: 04/29/2020 Prepared by: Rosana Hoes  Exercises Supine Piriformis Stretch with Foot on Ground - 3-4 x weekly - 2 reps - 20 seconds hold Bridge - 3-4 x weekly - 2 sets - 10 reps - 3 seconds hold Supine March - 3-4 x weekly - 2 sets - 10 reps Sidelying Hip Abduction - 3-4 x weekly - 2 sets - 10 reps Clamshell with Resistance - 3-4 x weekly - 2 sets - 15 reps Prone Hip Extension with Bent Knee - 3-4 x weekly - 2 sets - 10 reps Sit to Stand without Arm Support - 3-4 x weekly - 2 sets - 10 reps Standing Glute Med Mobilization with Small Ball on Wall - 1-2 minutes hold

## 2020-05-05 ENCOUNTER — Ambulatory Visit: Payer: Medicare Other | Admitting: Physical Therapy

## 2020-05-15 ENCOUNTER — Ambulatory Visit: Payer: Medicare Other | Attending: Family Medicine | Admitting: Family Medicine

## 2020-05-15 ENCOUNTER — Other Ambulatory Visit: Payer: Self-pay

## 2020-05-15 ENCOUNTER — Other Ambulatory Visit: Payer: Self-pay | Admitting: Family Medicine

## 2020-05-15 ENCOUNTER — Encounter: Payer: Self-pay | Admitting: Family Medicine

## 2020-05-15 VITALS — BP 145/75 | HR 74 | Ht 65.0 in | Wt 216.0 lb

## 2020-05-15 DIAGNOSIS — R7303 Prediabetes: Secondary | ICD-10-CM | POA: Diagnosis not present

## 2020-05-15 DIAGNOSIS — E0590 Thyrotoxicosis, unspecified without thyrotoxic crisis or storm: Secondary | ICD-10-CM

## 2020-05-15 DIAGNOSIS — Z1231 Encounter for screening mammogram for malignant neoplasm of breast: Secondary | ICD-10-CM

## 2020-05-15 DIAGNOSIS — I10 Essential (primary) hypertension: Secondary | ICD-10-CM | POA: Diagnosis not present

## 2020-05-15 DIAGNOSIS — Z23 Encounter for immunization: Secondary | ICD-10-CM

## 2020-05-15 DIAGNOSIS — Z0000 Encounter for general adult medical examination without abnormal findings: Secondary | ICD-10-CM

## 2020-05-15 MED ORDER — HYDROCHLOROTHIAZIDE 25 MG PO TABS: 12.5000 mg | ORAL_TABLET | Freq: Every day | ORAL | 1 refills | Status: DC

## 2020-05-15 MED ORDER — METOPROLOL SUCCINATE ER 25 MG PO TB24: 25.0000 mg | ORAL_TABLET | Freq: Every day | ORAL | 1 refills | Status: DC

## 2020-05-15 MED ORDER — METFORMIN HCL 500 MG PO TABS: 500.0000 mg | ORAL_TABLET | Freq: Every day | ORAL | 1 refills | Status: DC

## 2020-05-15 MED ORDER — ATORVASTATIN CALCIUM 20 MG PO TABS: 20.0000 mg | ORAL_TABLET | Freq: Every day | ORAL | 1 refills | Status: DC

## 2020-05-15 MED FILL — HYDROCHLOROTHIAZIDE 25 MG T: 25 | 90 days supply | Qty: 45 | Fill #0

## 2020-05-15 NOTE — Progress Notes (Signed)
Would like to get inhaler Coming in office to get flu vaccine and vitals.

## 2020-05-15 NOTE — Progress Notes (Signed)
Virtual Visit via Telephone Note  I connected with Kayla Preston, on 05/15/2020 at 9:44 AM by telephone due to the COVID-19 pandemic and verified that I am speaking with the correct person using two identifiers.   Consent: I discussed the limitations, risks, security and privacy concerns of performing an evaluation and management service by telephone and the availability of in person appointments. I also discussed with the patient that there may be a patient responsible charge related to this service. The patient expressed understanding and agreed to proceed.   Location of Patient: Home  Location of Provider: Clinic   Persons participating in Telemedicine visit: Anamika [employer] [place]-CMA Dr. Alvis Lemmings     History of Present Illness: Kayla Preston a 70 year old female with a history ofprediabetes (A1c 6.0),hypertension,hyperthyroidismprevious history of SVT,history of COVID-19 in 03/2019 (status post monoclonal antibody infusion)seenfor a follow up visit.    She has had her partial dentures and feels better and eats better. She also had new eye glasses. She has been out of Tapazole for the last 3 months and has not been seen by her Endocrinologist Dr Everardo All since 09/2018 as she was unsure if she needed to call for an appointment or they would call her.  After her PT her hip and buttock pain resolved and she no longer needs Meloxicam or Cyclobenzaprine. Tolerating her antihypertensive and her statin. She has no concerns today but would like to receive the Flu and Pneumonia vaccines. Past Medical History:  Diagnosis Date  . Atrial fibrillation (H[facility])   . Atrial fibrillation ([facility])    she had ablation which resolved intermittent afib  . Hypertension   . Obesity   . Seasonal allergies    No Known Allergies  Current Outpatient Medications on File Prior to Visit  Medication Sig Dispense Refill  . aspirin EC 81 MG tablet Take 81 mg by mouth daily.    Marland Kitchen  atorvastatin (LIPITOR) 20 MG tablet TAKE 1 TABLET (20 MG TOTAL) BY MOUTH DAILY. 90 tablet 0  . Cyanocobalamin (VITAMIN B 12 PO) Take 1 tablet by mouth daily. Reported on 07/22/2015    . hydrochlorothiazide (HYDRODIURIL) 25 MG tablet Take 0.5 tablets (12.5 mg total) by mouth daily. 90 tablet 1  . LINZESS 145 MCG CAPS capsule Take 1 capsule by mouth daily.  2  . meloxicam (MOBIC) 7.5 MG tablet Take 1 tablet (7.5 mg total) by mouth daily. 30 tablet 1  . metFORMIN (GLUCOPHAGE) 500 MG tablet TAKE 1 TABLET (500 MG TOTAL) BY MOUTH DAILY WITH BREAKFAST. 90 tablet 0  . methimazole (TAPAZOLE) 5 MG tablet Take 1 tablet (5 mg total) by mouth daily. 30 tablet 3  . methocarbamol (ROBAXIN) 500 MG tablet Take 1 tablet (500 mg total) by mouth every 8 (eight) hours as needed for muscle spasms. 60 tablet 1  . metoprolol succinate (TOPROL-XL) 25 MG 24 hr tablet TAKE 1 TABLET (25 MG TOTAL) BY MOUTH DAILY. 90 tablet 3  . Misc. Devices MISC Blood Pressure monitor. Dx: Hypertension 1 each 0  . Vitamin D, Ergocalciferol, (DRISDOL) 1.25 MG (50000 UNIT) CAPS capsule Take 1 capsule (50,000 Units total) by mouth every 7 (seven) days. 12 capsule 0   No current facility-administered medications on file prior to visit.    Observations/Objective: Today's Vitals   05/15/20 1051  BP: (!) 145/75  Pulse: 74  SpO2: 95%  Weight: 216 lb (98 kg)  Height: 5\' 5"  (1.651 m)   Body mass index is 35.94 kg/m.  Wake, alert oriented x3 Not  in acute distress   CMP Latest Ref Rng & Units 10/31/2019 04/13/2019 03/13/2019  Glucose 65 - 99 mg/dL 92 673(A) 80  BUN 8 - 27 mg/dL 17 9 14   Creatinine 0.57 - 1.00 mg/dL 1.93 7.90  Sodium 134 - 144 mmol/L 143 139 142  Potassium 3.5 - 5.2 mmol/L 4.0 4.0 4.4  Chloride 96 - 106 mmol/L 110(H) 107 102  CO2 20 - 29 mmol/L 20 22 22   Calcium 8.7 - 10.3 mg/dL 2.40 9.7  Total Protein 6.0 - 8.5 g/dL 7.3 - 7.0  Total Bilirubin 0.0 - 1.2 mg/dL 0.3 - 0.2  Alkaline Phos 48 - 121 IU/L 94 - 108   AST 0 - 40 IU/L 21 - 30  ALT 0 - 32 IU/L 33(H) - 29    Lipid Panel     Component Value Date/Time   CHOL 210 (H) 10/31/2019 1125   TRIG 96 10/31/2019 1125   HDL 67 10/31/2019 1125   CHOLHDL 3.1 10/31/2019 1125   CHOLHDL 3.4 05/17/2016 1004   VLDL 18 05/17/2016 1004   LDLCALC 126 (H) 10/31/2019 1125   LABVLDL 17 10/31/2019 1125    Lab Results  Component Value Date   HGBA1C 6.0 10/31/2019    Assessment and Plan: 1. Prediabetes Controlled with A1c of 6.0 Continue diabetic diet, lifestyle modification - Hemoglobin A1c - metFORMIN (GLUCOPHAGE) 500 MG tablet; Take 1 tablet (500 mg total) by mouth daily with breakfast.  Dispense: 90 tablet; Refill: 1  2. Essential hypertension Slightly above goal No regimen change but she will work on her lifestyle Counseled on blood pressure goal of less than 130/80, low-sodium, DASH diet, medication compliance, 150 minutes of moderate intensity exercise per week. Discussed medication compliance, adverse effects. - Basic Metabolic Panel - metoprolol succinate (TOPROL-XL) 25 MG 24 hr tablet; Take 1 tablet (25 mg total) by mouth daily.  Dispense: 90 tablet; Refill: 1 - hydrochlorothiazide (HYDRODIURIL) 25 MG tablet; Take 0.5 tablets (12.5 mg total) by mouth daily.  Dispense: 90 tablet; Refill: 1  3. Encounter for screening mammogram for malignant neoplasm of breast - MM 3D SCREEN BREAST BILATERAL; Future  4. Hyperthyroidism Will send off thyroid panel and resume Tapazole if abnormal Advised to schedule appointment with her Endocrinologist - T4, free - TSH  5. Healthcare maintenance - Pneumococcal polysaccharide vaccine 23-valent greater than or equal to 2yo subcutaneous/IM  6. Need for immunization against influenza - Flu Vaccine QUAD 36+ mos IM   Follow Up Instructions: 6 months   I discussed the assessment and treatment plan with the patient. The patient was provided an opportunity to ask questions and all were answered. The  patient agreed with the plan and demonstrated an understanding of the instructions.   The patient was advised to call back or seek an in-person evaluation if the symptoms worsen or if the condition fails to improve as anticipated.     I provided 21 minutes total of non-face-to-face time during this encounter including median intraservice time, reviewing previous notes, investigations, ordering medications, medical decision making, coordinating care and patient verbalized understanding at the end of the visit.     11/02/2019, MD, FAAFP. [facility] [place], [facility] [place]   05/15/2020, 9:44 AM

## 2020-05-16 LAB — BASIC METABOLIC PANEL
BUN/Creatinine Ratio: 19 (ref 12–28)
BUN: 14 mg/dL (ref 8–27)
CO2: 21 mmol/L (ref 20–29)
Calcium: 9.3 mg/dL (ref 8.7–10.3)
Chloride: 107 mmol/L — ABNORMAL HIGH (ref 96–106)
Creatinine, Ser: 0.72 mg/dL (ref 0.57–1.00)
GFR calc Af Amer: 100 mL/min/{1.73_m2} (ref >59)
GFR calc non Af Amer: 87 mL/min/{1.73_m2} (ref >59)
Glucose: 89 mg/dL (ref 65–99)
Potassium: 4.1 mmol/L (ref 3.5–5.2)
Sodium: 141 mmol/L (ref 134–144)

## 2020-05-16 LAB — HEMOGLOBIN A1C
Est. average glucose Bld gHb Est-mCnc: 128 mg/dL
Hgb A1c MFr Bld: 6.1 % — ABNORMAL HIGH (ref 4.8–5.6)

## 2020-05-16 LAB — T4, FREE: Free T4: 1.09 ng/dL (ref 0.82–1.77)

## 2020-05-16 LAB — TSH: TSH: 1.51 u[IU]/mL (ref 0.450–4.500)

## 2020-07-10 ENCOUNTER — Other Ambulatory Visit: Payer: Self-pay | Admitting: Gastroenterology

## 2020-07-10 MED FILL — METOPROLOL SUCCINATE ER 25: 25 | 90 days supply | Qty: 90 | Fill #0

## 2020-07-10 MED FILL — LINZESS 145 MCG CAPSULE: 145 | 90 days supply | Qty: 90 | Fill #0

## 2020-08-05 ENCOUNTER — Other Ambulatory Visit: Payer: Self-pay | Admitting: Cardiovascular Disease

## 2020-08-05 ENCOUNTER — Ambulatory Visit (INDEPENDENT_AMBULATORY_CARE_PROVIDER_SITE_OTHER): Payer: Medicare Other | Admitting: Cardiovascular Disease

## 2020-08-05 ENCOUNTER — Other Ambulatory Visit: Payer: Self-pay

## 2020-08-05 ENCOUNTER — Encounter: Payer: Self-pay | Admitting: Cardiovascular Disease

## 2020-08-05 DIAGNOSIS — I10 Essential (primary) hypertension: Secondary | ICD-10-CM

## 2020-08-05 DIAGNOSIS — E6609 Other obesity due to excess calories: Secondary | ICD-10-CM

## 2020-08-05 DIAGNOSIS — R002 Palpitations: Secondary | ICD-10-CM | POA: Diagnosis not present

## 2020-08-05 DIAGNOSIS — Z6835 Body mass index (BMI) 35.0-35.9, adult: Secondary | ICD-10-CM

## 2020-08-05 DIAGNOSIS — I5189 Other ill-defined heart diseases: Secondary | ICD-10-CM | POA: Diagnosis not present

## 2020-08-05 DIAGNOSIS — I4510 Unspecified right bundle-branch block: Secondary | ICD-10-CM

## 2020-08-05 DIAGNOSIS — U071 COVID-19: Secondary | ICD-10-CM | POA: Diagnosis not present

## 2020-08-05 MED ORDER — METOPROLOL SUCCINATE ER 50 MG PO TB24: 50.0000 mg | ORAL_TABLET | Freq: Every day | ORAL | 1 refills | Status: DC

## 2020-08-05 MED ORDER — HYDROCHLOROTHIAZIDE 25 MG PO TABS: 12.5000 mg | ORAL_TABLET | ORAL | 1 refills | Status: DC

## 2020-08-05 MED FILL — HYDROCHLOROTHIAZIDE 25 MG T: 25 | 90 days supply | Qty: 45 | Fill #0

## 2020-08-05 NOTE — Patient Instructions (Signed)
Medication Instructions:  Change how you take HCTZ- start taking every other day  Increase Metoprolol to 50 mg daily   *If you need a refill on your cardiac medications before your next appointment, please call your pharmacy*  Follow-Up: At [facility], you and your health needs are our priority.  As part of our continuing mission to provide you with exceptional heart care, we have created designated Provider Care Teams.  These Care Teams include your primary Cardiologist (physician) and Advanced Practice Providers (APPs -  Physician Assistants and Nurse Practitioners) who all work together to provide you with the care you need, when you need it.  We recommend signing up for the patient portal called "MyChart".  Sign up information is provided on this After Visit Summary.  MyChart is used to connect with patients for Virtual Visits (Telemedicine).  Patients are able to view lab/test results, encounter notes, upcoming appointments, etc.  Non-urgent messages can be sent to your provider as well.   To learn more about what you can do with MyChart, go to [email or web address].    Your next appointment:   12 month(s)  The format for your next appointment:   In Person  Provider:   Nicki Guadalajara, MD

## 2020-08-05 NOTE — Progress Notes (Signed)
Cardiology Office Note    Date:  08/07/2020   ID:  Kayla Preston, DOB 1950-07-15, MRN [id number]  PCP:  [name], MD  Cardiologist:  Shelva Majestic, MD   52-month follow-up evaluation  History of Present Illness:  Kayla Preston is a 70 y.o. female  who was referred byEnobongfor evaluation of palpitations.  She was evaluated initially on September 05, 2018 in a telemedicine visit. She presents for a 13 month follow-uo evaluation  Kayla Preston  was hospitalized at [facility] in 2010 and underwent an atrial fibrillation ablation. She had done very well with reference to this. She has a history of hypertension and has been on amlodipine in addition tohydrochlorothiazide.She also has GI issues which have improved with Linzess.Recently, she has begun to notice episodic palpitations associated with night sweats. She denied any associated chest pain. She had undergone recent laboratory and was found to have an over suppressed thyroid TSH of 0.020. She was scheduled to see an endocrinologist and  was started on methimazole 5 mg.  She was subsequently evaluated by Dr. Renato Shin.  Subsequent TSH and free T4 were normal and she was advised to continue her same medication.  She also has a history of vitamin D deficiency and was on 50,000 units weekly.  When I initially evaluated her, she was on amlodipine and HCTZ for hypertension.  She was found to have over suppressed thyroid which may be contributing to her arrhythmia and palpitations elected to add Toprol-XL initially at 25 mg.  I recommended that she undergo a 2D echo Doppler study which was done on October 11, 2018 and showed normal LV function with mild LVH and mild diastolic dysfunction.  There is very mild dilation of her aortic root at 39 mm.  When she was evaluated in her follow-up telemedicine evaluation in June 2020 she noted significant benefit since the initiation of Toprol-XL.  She states her blood pressure typically runs in  the 426 systolic range.  She denied any recurrent palpitations,presyncope or syncope or chest pain.  She was sleeping well but often wakes up when neighbors in a nearby apartment seem to smoke in the early morning hours with weed and other compounds  which wakes her up and contributes to congestion.  She was diagnosed with COVID-19 infection and questionable pneumonia.  She was treated with albuterol, Decadron, as well as Zithromax in the emergency room on April 13, 2019.  On December 8 she had an infusion of bamlanivimab.  Following her infusion therapy she felt significant benefit in her symptomatology and shortness of breath.    I last saw her in the office in January 2021 at which time she felt well and was without chest pain or palpitations.   She was without recurrent tachypalpitations and tolerating metoprolol XL 25 mg daily.  She continued to be on HCTZ 12.5 mg and denied leg swelling.  She also was on atorvastatin 20 mg for hyperlipidemia.  Her blood pressure was controlled.  Since I last saw her, she has been evaluated by Dr. Margarita Rana.  She had undergone laboratory in January 2022 and had an normal TSH of 1.5.  Renal function was stable with a creatinine of 0.72.  Fasting glucose was 89 and hemoglobin A1c was mildly increased at 6.1.  Presently she denies any chest pain.  She denies any swelling.  She has noticed that her pulse tends to be on the high side.  She has not been successful with weight loss.  She presents for reevaluation.  Past Medical History:  Diagnosis Date  . Atrial fibrillation ([place])   . Atrial fibrillation ([place])    she had ablation which resolved intermittent afib  . Hypertension   . Obesity   . Seasonal allergies     Past Surgical History:  Procedure Laterality Date  . ABLATION      Current Medications: Outpatient Medications Prior to Visit  Medication Sig Dispense Refill  . aspirin EC 81 MG tablet Take 81 mg by mouth daily.    Marland Kitchen atorvastatin (LIPITOR) 20 MG  tablet Take 1 tablet (20 mg total) by mouth daily. 90 tablet 1  . Cyanocobalamin (VITAMIN B 12 PO) Take 1 tablet by mouth daily. Reported on 07/22/2015    . LINZESS 145 MCG CAPS capsule Take 1 capsule by mouth daily.  2  . metFORMIN (GLUCOPHAGE) 500 MG tablet Take 1 tablet (500 mg total) by mouth daily with breakfast. 90 tablet 1  . methimazole (TAPAZOLE) 5 MG tablet Take 1 tablet (5 mg total) by mouth daily. 30 tablet 3  . methocarbamol (ROBAXIN) 500 MG tablet Take 1 tablet (500 mg total) by mouth every 8 (eight) hours as needed for muscle spasms. 60 tablet 1  . Misc. Devices MISC Blood Pressure monitor. Dx: Hypertension 1 each 0  . Vitamin D, Ergocalciferol, (DRISDOL) 1.25 MG (50000 UNIT) CAPS capsule Take 1 capsule (50,000 Units total) by mouth every 7 (seven) days. 12 capsule 0  . hydrochlorothiazide (HYDRODIURIL) 25 MG tablet Take 0.5 tablets (12.5 mg total) by mouth daily. 90 tablet 1  . metoprolol succinate (TOPROL-XL) 25 MG 24 hr tablet Take 1 tablet (25 mg total) by mouth daily. 90 tablet 1   No facility-administered medications prior to visit.     Allergies:   Patient has no known allergies.   Social History   Socioeconomic History  . Marital status: Single    Spouse name: Not on file  . Number of children: Not on file  . Years of education: Not on file  . Highest education level: Not on file  Occupational History  . Not on file  Tobacco Use  . Smoking status: Never Smoker  . Smokeless tobacco: Never Used  Substance and Sexual Activity  . Alcohol use: No  . Drug use: No  . Sexual activity: Not Currently  Other Topics Concern  . Not on file  Social History Narrative  . Not on file   Social Determinants of Health   Financial Resource Strain: Not on file  Food Insecurity: Not on file  Transportation Needs: Not on file  Physical Activity: Not on file  Stress: Not on file  Social Connections: Not on file     Family History:  The patient's family history includes  Heart disease in her father and mother; Hyperlipidemia in her sister.   ROS General: Negative; No fevers, chills, or night sweats;  HEENT: Negative; No changes in vision or hearing, sinus congestion, difficulty swallowing Pulmonary: Negative; No cough, wheezing, shortness of breath, hemoptysis Cardiovascular: Positive for Covid infection with possible pneumonia GI: Negative; No nausea, vomiting, diarrhea, or abdominal pain GU: Negative; No dysuria, hematuria, or difficulty voiding Musculoskeletal: Negative; no myalgias, joint pain, or weakness Hematologic/Oncology: Negative; no easy bruising, bleeding Endocrine: Negative; no heat/cold intolerance; no diabetes Neuro: Negative; no changes in balance, headaches Skin: Negative; No rashes or skin lesions Psychiatric: Negative; No behavioral problems, depression Sleep: Negative; No snoring, daytime sleepiness, hypersomnolence, bruxism, restless legs, hypnogognic hallucinations, no cataplexy Other comprehensive 14 point system review is  negative.   PHYSICAL EXAM:   VS:  BP 140/90 (BP Location: Right Arm, Patient Position: Sitting, Cuff Size: Large)   Pulse 94   Ht $R'5\' 5"'LZ$  (1.651 m)   Wt 219 lb (99.3 kg)   BMI 36.44 kg/m    Repeat blood pressure by me was 128/84.  Blood pressure by me 128/88 Wt Readings from Last 3 Encounters:  08/05/20 219 lb (99.3 kg)  05/15/20 216 lb (98 kg)  10/31/19 213 lb (96.6 kg)    General: Alert, oriented, no distress.  Skin: normal turgor, no rashes, warm and dry HEENT: Normocephalic, atraumatic. Pupils equal round and reactive to light; sclera anicteric; extraocular muscles intact;  Nose without nasal septal hypertrophy Mouth/Parynx benign; Mallinpatti scale 3 Neck: No JVD, no carotid bruits; normal carotid upstroke Lungs: clear to ausculatation and percussion; no wheezing or rales Chest wall: without tenderness to palpitation Heart: PMI not displaced, RRR, s1 s2 normal, 1/6 systolic murmur, no diastolic  murmur, no rubs, gallops, thrills, or heaves Abdomen: Mild central adiposity; soft, nontender; no hepatosplenomehaly, BS+; abdominal aorta nontender and not dilated by palpation. Back: no CVA tenderness Pulses 2+ Musculoskeletal: full range of motion, normal strength, no joint deformities Extremities: no clubbing cyanosis or edema, Homan's sign negative  Neurologic: grossly nonfocal; Cranial nerves grossly wnl Psychologic: Normal mood and affect   Studies/Labs Reviewed:   EKG:  EKG  ordered today.  ECG (independently read by me): SInus rhytyhm at 94, PVC; RBBB  January 2021 ECG (independently read by me): Normal sinus rhythm at 75 bpm.  Incomplete right bundle branch block.  No ectopy.  Recent Labs: BMP Latest Ref Rng & Units 05/15/2020 10/31/2019 04/13/2019  Glucose 65 - 99 mg/dL 89 92 114(H)  BUN 8 - 27 mg/dL $Remove'14 17 9  'rSoLRBj$ Creatinine 0.57 - 1.00 mg/dL 0.72 0.78 0.76  BUN/Creat Ratio 12 - $Re'28 19 22 'iuE$ -  Sodium 134 - 144 mmol/L 141 143 139  Potassium 3.5 - 5.2 mmol/L 4.1 4.0 4.0  Chloride 96 - 106 mmol/L 107(H) 110(H) 107  CO2 20 - 29 mmol/L $RemoveB'21 20 22  'njgyogti$ Calcium 8.7 - 10.3 mg/dL 9.3 10.2 9.7     Hepatic Function Latest Ref Rng & Units 10/31/2019 03/13/2019 07/27/2018  Total Protein 6.0 - 8.5 g/dL 7.3 7.0 6.8  Albumin 3.8 - 4.8 g/dL 4.4 4.5 4.5  AST 0 - 40 IU/L $Remov'21 30 23  'nvLlGn$ ALT 0 - 32 IU/L 33(H) 29 41(H)  Alk Phosphatase 48 - 121 IU/L 94 108 77  Total Bilirubin 0.0 - 1.2 mg/dL 0.3 0.2 0.3  Bilirubin, Direct <=0.2 mg/dL - - -    CBC Latest Ref Rng & Units 04/13/2019 03/06/2018 11/18/2017  WBC 4.0 - 10.5 K/uL 3.3(L) 4.1 4.6  Hemoglobin 12.0 - 15.0 g/dL 13.9 13.1 12.9  Hematocrit 36.0 - 46.0 % 44.4 41.9 41.8  Platelets 150 - 400 K/uL 229 225 268   Lab Results  Component Value Date   MCV 74.5 (L) 04/13/2019   MCV 75.0 (L) 03/06/2018   MCV 77 (L) 11/18/2017   Lab Results  Component Value Date   TSH 1.510 05/15/2020   Lab Results  Component Value Date   HGBA1C 6.1 (H) 05/15/2020      BNP No results found for: BNP  ProBNP No results found for: PROBNP   Lipid Panel     Component Value Date/Time   CHOL 210 (H) 10/31/2019 1125   TRIG 96 10/31/2019 1125   HDL 67 10/31/2019 1125   CHOLHDL 3.1  10/31/2019 1125   CHOLHDL 3.4 05/17/2016 1004   VLDL 18 05/17/2016 1004   LDLCALC 126 (H) 10/31/2019 1125   LABVLDL 17 10/31/2019 1125     RADIOLOGY: No results found.   Additional studies/ records that were reviewed today include:   ECHO 10/11/2018 IMPRESSIONS  1. The left ventricle has normal systolic function, with an ejection fraction of 55-60%. The cavity size was normal. There is mildly increased left ventricular wall thickness. Left ventricular diastolic Doppler parameters are consistent with impaired  relaxation. No evidence of left ventricular regional wall motion abnormalities.  2. The right ventricle has normal systolic function. The cavity was normal. There is no increase in right ventricular wall thickness.  3. There is mild mitral annular calcification present. Trivial mitral regurgitation.  4. The aortic valve is tricuspid. No stenosis of the aortic valve.  5. There is mild dilatation of the aortic root measuring 39 mm.  6. Normal IVC size. PA systolic pressure 21 mmHg.  ASSESSMENT:    1. Essential hypertension   2. Palpitations   3. Bundle branch block, right   4. Grade I diastolic dysfunction   5. Class 2 obesity due to excess calories without serious comorbidity with body mass index (BMI) of 35.0 to 35.9 in adult   6. COVID-19 virus infection: December 2020     PLAN:  1.  Essential hypertension: Initial blood pressure on presentation today was elevated at 140/90 and resting pulse was 94.  She has not had any edema.  She has been on metoprolol succinate 25 mg daily and HCTZ 12.5 mg.  With her increased pulse, I have recommended she increase metoprolol succinate to 50 mg daily.  She does not have any edema.  I have recommended she decrease her  HCTZ to every other day.  2.  Palpitations: There is no recurrent palpitations.  She is on metoprolol succinate and her TSH is now normal.  3.  Grade 1 diastolic dysfunction: Noted on echo Doppler study on October 11, 2018.  She has normal LV systolic function.  There is no significant in dyspnea on exertion  4.  Right bundle branch block: Stable  5.  Previous hyperthyroidism with over suppression of TSH for which she was started on methimazole.  Most recent TSH excellent at 1.51  6.  Moderate obesity: BMI today is slightly further increased from her last visit now at 36.4.  Weight loss was recommended.  I discussed the importance of walking at least 5 days/week for at least 30 minutes if at all possible.  7. COVID-19 infection: December 2020 treated with steroids, Zithromax, and received bamlanivimab infusion therapy with marked resolution of symptoms.  She continues to see Dr. Margarita Rana on a regular basis.  I will see her in 1 year for reevaluation.  Medication Adjustments/Labs and Tests Ordered: Current medicines are reviewed at length with the patient today.  Concerns regarding medicines are outlined above.  Medication changes, Labs and Tests ordered today are listed in the Patient Instructions below. Patient Instructions  Medication Instructions:  Change how you take HCTZ- start taking every other day  Increase Metoprolol to 50 mg daily   *If you need a refill on your cardiac medications before your next appointment, please call your pharmacy*  Follow-Up: At [facility], you and your health needs are our priority.  As part of our continuing mission to provide you with exceptional heart care, we have created designated Provider Care Teams.  These Care Teams include your primary Cardiologist (physician)  and Advanced Practice Providers (APPs -  Physician Assistants and Nurse Practitioners) who all work together to provide you with the care you need, when you need it.  We recommend signing  up for the patient portal called "MyChart".  Sign up information is provided on this After Visit Summary.  MyChart is used to connect with patients for Virtual Visits (Telemedicine).  Patients are able to view lab/test results, encounter notes, upcoming appointments, etc.  Non-urgent messages can be sent to your provider as well.   To learn more about what you can do with MyChart, go to [email or web address].    Your next appointment:   12 month(s)  The format for your next appointment:   In Person  Provider:   Shelva Majestic, MD       Signed, Shelva Majestic, MD  08/07/2020 5:49 PM    [facility] [address] [place], [place], [place]  [address] Phone: [phone]

## 2020-08-07 ENCOUNTER — Encounter: Payer: Self-pay | Admitting: Cardiovascular Disease

## 2020-08-07 MED FILL — METFORMIN HCL 500 MG TABS: 500 | 90 days supply | Qty: 90 | Fill #0

## 2020-08-07 MED FILL — ATORVASTATIN CALCIUM 20 MG: 20 | 30 days supply | Qty: 30 | Fill #0

## 2020-08-13 ENCOUNTER — Emergency Department (HOSPITAL_COMMUNITY): Payer: Medicare Other

## 2020-08-13 ENCOUNTER — Emergency Department (HOSPITAL_COMMUNITY)
Admission: EM | Admit: 2020-08-13 | Discharge: 2020-08-13 | Disposition: A | Discharge status: Home / Self Care | Payer: Medicare Other | Attending: Emergency Medicine | Admitting: Emergency Medicine

## 2020-08-13 ENCOUNTER — Other Ambulatory Visit: Payer: Self-pay

## 2020-08-13 DIAGNOSIS — Z7982 Long term (current) use of aspirin: Secondary | ICD-10-CM | POA: Insufficient documentation

## 2020-08-13 DIAGNOSIS — Z7984 Long term (current) use of oral hypoglycemic drugs: Secondary | ICD-10-CM | POA: Insufficient documentation

## 2020-08-13 DIAGNOSIS — J069 Acute upper respiratory infection, unspecified: Secondary | ICD-10-CM | POA: Diagnosis not present

## 2020-08-13 DIAGNOSIS — I2510 Atherosclerotic heart disease of native coronary artery without angina pectoris: Secondary | ICD-10-CM | POA: Diagnosis not present

## 2020-08-13 DIAGNOSIS — B9789 Other viral agents as the cause of diseases classified elsewhere: Secondary | ICD-10-CM | POA: Diagnosis not present

## 2020-08-13 DIAGNOSIS — Z79899 Other long term (current) drug therapy: Secondary | ICD-10-CM | POA: Insufficient documentation

## 2020-08-13 DIAGNOSIS — I4891 Unspecified atrial fibrillation: Secondary | ICD-10-CM | POA: Diagnosis not present

## 2020-08-13 DIAGNOSIS — R0602 Shortness of breath: Secondary | ICD-10-CM | POA: Diagnosis not present

## 2020-08-13 DIAGNOSIS — I4510 Unspecified right bundle-branch block: Secondary | ICD-10-CM | POA: Diagnosis not present

## 2020-08-13 DIAGNOSIS — R059 Cough, unspecified: Secondary | ICD-10-CM | POA: Diagnosis not present

## 2020-08-13 DIAGNOSIS — J4540 Moderate persistent asthma, uncomplicated: Secondary | ICD-10-CM | POA: Diagnosis not present

## 2020-08-13 DIAGNOSIS — I700 Atherosclerosis of aorta: Secondary | ICD-10-CM | POA: Diagnosis not present

## 2020-08-13 DIAGNOSIS — Z8616 Personal history of COVID-19: Secondary | ICD-10-CM | POA: Insufficient documentation

## 2020-08-13 DIAGNOSIS — R079 Chest pain, unspecified: Secondary | ICD-10-CM | POA: Insufficient documentation

## 2020-08-13 DIAGNOSIS — R0981 Nasal congestion: Secondary | ICD-10-CM | POA: Diagnosis present

## 2020-08-13 DIAGNOSIS — Z20822 Contact with and (suspected) exposure to covid-19: Secondary | ICD-10-CM | POA: Diagnosis not present

## 2020-08-13 DIAGNOSIS — I10 Essential (primary) hypertension: Secondary | ICD-10-CM | POA: Diagnosis not present

## 2020-08-13 DIAGNOSIS — R519 Headache, unspecified: Secondary | ICD-10-CM | POA: Diagnosis not present

## 2020-08-13 LAB — CBC WITH DIFFERENTIAL/PLATELET
Abs Immature Granulocytes: 0.03 10*3/uL (ref 0.00–0.07)
Basophils Absolute: 0 10*3/uL (ref 0.0–0.1)
Basophils Relative: 0 %
Eosinophils Absolute: 0.1 10*3/uL (ref 0.0–0.5)
Eosinophils Relative: 2 %
HCT: 42.6 % (ref 36.0–46.0)
Hemoglobin: 13.5 g/dL (ref 12.0–15.0)
Immature Granulocytes: 0 %
Lymphocytes Relative: 23 %
Lymphs Abs: 1.5 10*3/uL (ref 0.7–4.0)
MCH: 23.4 pg — ABNORMAL LOW (ref 26.0–34.0)
MCHC: 31.7 g/dL (ref 30.0–36.0)
MCV: 74 fL — ABNORMAL LOW (ref 80.0–100.0)
Monocytes Absolute: 0.8 10*3/uL (ref 0.1–1.0)
Monocytes Relative: 12 %
Neutro Abs: 4.2 10*3/uL (ref 1.7–7.7)
Neutrophils Relative %: 63 %
Platelets: 208 10*3/uL (ref 150–400)
RBC: 5.76 MIL/uL — ABNORMAL HIGH (ref 3.87–5.11)
RDW: 16.6 % — ABNORMAL HIGH (ref 11.5–15.5)
WBC: 6.7 10*3/uL (ref 4.0–10.5)
nRBC: 0 % (ref 0.0–0.2)

## 2020-08-13 LAB — BASIC METABOLIC PANEL
Anion gap: 10 (ref 5–15)
BUN: 11 mg/dL (ref 8–23)
CO2: 22 mmol/L (ref 22–32)
Calcium: 9.1 mg/dL (ref 8.9–10.3)
Chloride: 104 mmol/L (ref 98–111)
Creatinine, Ser: 0.93 mg/dL (ref 0.44–1.00)
GFR, Estimated: >60 mL/min (ref >60)
Glucose, Bld: 105 mg/dL — ABNORMAL HIGH (ref 70–99)
Potassium: 4.1 mmol/L (ref 3.5–5.1)
Sodium: 136 mmol/L (ref 135–145)

## 2020-08-13 MED ORDER — ACETAMINOPHEN 500 MG PO TABS
1000.0000 mg | ORAL_TABLET | Freq: Once | ORAL | Status: AC
  Administered 2020-08-13: 1000 mg via ORAL
  Filled 2020-08-13: qty 2

## 2020-08-13 MED ORDER — BENZONATATE 100 MG PO CAPS
100.0000 mg | ORAL_CAPSULE | Freq: Three times a day (TID) | ORAL | 0 refills | Status: DC
  Filled 2020-08-13: qty 21, 7d supply, fill #0

## 2020-08-13 MED ORDER — IOHEXOL 300 MG/ML  SOLN
75.0000 mL | Freq: Once | INTRAMUSCULAR | Status: AC | PRN
  Administered 2020-08-13: 75 mL via INTRAVENOUS

## 2020-08-13 MED ORDER — AZITHROMYCIN 250 MG PO TABS
ORAL_TABLET | ORAL | 0 refills | Status: DC
  Filled 2020-08-15: qty 6, 5d supply, fill #0

## 2020-08-13 MED ORDER — PREDNISONE 20 MG PO TABS
40.0000 mg | ORAL_TABLET | Freq: Every day | ORAL | 0 refills | Status: DC
  Filled 2020-08-13: qty 10, 5d supply, fill #0

## 2020-08-13 MED ORDER — BENZONATATE 100 MG PO CAPS
200.0000 mg | ORAL_CAPSULE | Freq: Once | ORAL | Status: AC
  Administered 2020-08-13: 200 mg via ORAL
  Filled 2020-08-13: qty 2

## 2020-08-13 MED ORDER — PREDNISONE 20 MG PO TABS
40.0000 mg | ORAL_TABLET | Freq: Once | ORAL | Status: AC
  Administered 2020-08-13: 40 mg via ORAL
  Filled 2020-08-13: qty 2

## 2020-08-13 NOTE — ED Triage Notes (Signed)
Pt states yesterday she was out working in the garden. Pt states that she started to get nasal congestion and itchy throat. Pt states using an inhaler and it felt a little better. Pt states a history of allergies, but only happens every 2-3 years.  No swelling noted to oral cavity or tongue.

## 2020-08-13 NOTE — ED Notes (Addendum)
Discharge instructions discussed with pt. Pt verbalized understanding. Pt stable and ambulatory.  °

## 2020-08-13 NOTE — ED Provider Notes (Signed)
Patient placed in Quick Look pathway, seen and evaluated   Chief Complaint: cough and congestion  HPI:   Tightness right chest with coughing  ROS: no fever   Physical Exam:   Gen: No distress  Neuro: Awake and Alert  Skin: Warm    Focused Exam: nasal congestion  Hacking cough  Lungs rhonchi   Initiation of care has begun. The patient has been counseled on the process, plan, and necessity for staying for the completion/evaluation, and the remainder of the medical screening examination   Kayla Preston 08/13/20 1814    Gwyneth Sprout, MD 08/13/20 2304

## 2020-08-13 NOTE — ED Provider Notes (Addendum)
[facility] EMERGENCY DEPARTMENT Provider Note   CSN: [id number] Arrival date & time: 08/13/20  1633     History Chief Complaint  Patient presents with  . Nasal Congestion    Kayla Preston is a 70 y.o. female.  Patient is a 70 year old female with a history of hypertension, anemia and seasonal allergies who is presenting today with several complaints.  She reports that on Monday she started having cough, nasal congestion, sore throat which has persisted since Monday.  She has felt feverish and chilled as well as when she is coughing she has pain in the right side of her chest.  She occasionally will have some minimal shortness of breath that does improve with an inhaler that she is gotten in the past when her allergies have been bad.  She did spend all day outside on Saturday in her garden and initially thought that that was the cause of her symptoms.  She has had no known sick contacts.  She has received the COVID vaccine and booster.  After significant coughing which is always worse at night she was having some pain in her right side yesterday but denies any pain currently.  She has had no vomiting or diarrhea.  She has been taking over-the-counter medication and the inhaler which has only minimally helped with the cough.  The history is provided by the patient.       Past Medical History:  Diagnosis Date  . Atrial fibrillation (HCC)   . Atrial fibrillation (HCC)    she had ablation which resolved intermittent afib  . Hypertension   . Obesity   . Seasonal allergies     Patient Active Problem List   Diagnosis Date Noted  . Lab test positive for detection of COVID-19 virus 04/14/2019  . Hyperthyroidism 09/13/2018  . Viral URI with cough 03/13/2018  . Moderate persistent asthma without complication 03/13/2018  . Seasonal allergies 09/13/2017  . Essential hypertension 06/15/2017  . Cervical polyp 06/16/2016  . Anemia, iron deficiency 07/22/2015  . Awareness  of heartbeats 07/05/2011  . Bundle branch block, right 07/05/2011  . Supraventricular tachycardia (HCC) 07/05/2011    Past Surgical History:  Procedure Laterality Date  . ABLATION       OB History   No obstetric history on file.     Family History  Problem Relation Age of Onset  . Heart disease Mother   . Heart disease Father   . Hyperlipidemia Sister   . Thyroid disease Neg Hx     Social History   Tobacco Use  . Smoking status: Never Smoker  . Smokeless tobacco: Never Used  Substance Use Topics  . Alcohol use: No  . Drug use: No    Home Medications Prior to Admission medications   Medication Sig Start Date End Date Taking? Authorizing Provider  aspirin EC 81 MG tablet Take 81 mg by mouth daily.    [provider]  atorvastatin (LIPITOR) 20 MG tablet TAKE 1 TABLET (20 MG TOTAL) BY MOUTH DAILY. 05/15/20 05/15/21  Hoy Register, MD  Cyanocobalamin (VITAMIN B 12 PO) Take 1 tablet by mouth daily. Reported on 07/22/2015    [provider]  hydrochlorothiazide (HYDRODIURIL) 25 MG tablet TAKE 0.5 TABLETS (12.5 MG TOTAL) BY MOUTH EVERY OTHER DAY. 08/05/20 08/05/21  Lennette Bihari, MD  linaclotide (LINZESS) 145 MCG CAPS capsule TAKE 1 CAPSULE BY MOUTH AT LEAST 30 MINUTES BEFORE THE FIRST MEAL OF THE DAY ON AN EMPTY STOMACH 07/10/20 07/10/21  Brahmbhatt,  Parag, MD  linaclotide (LINZESS) 145 MCG CAPS capsule TAKE 1 CAPSULE BY MOUTH AT LEAST 30 MINUTES BEFORE THE FIRST MEAL OF THE DAY ON AN EMPTY STOMACH 10/17/19 10/16/20  Kathi Der, MD  LINZESS 145 MCG CAPS capsule Take 1 capsule by mouth daily. 01/18/18   [provider]  metFORMIN (GLUCOPHAGE) 500 MG tablet TAKE 1 TABLET (500 MG TOTAL) BY MOUTH DAILY WITH BREAKFAST. 05/15/20 05/15/21  Hoy Register, MD  methimazole (TAPAZOLE) 5 MG tablet Take 1 tablet (5 mg total) by mouth daily. 10/31/19   Hoy Register, MD  methocarbamol (ROBAXIN) 500 MG tablet Take 1 tablet (500 mg total) by mouth every 8 (eight) hours as  needed for muscle spasms. 03/05/20   Hoy Register, MD  metoprolol succinate (TOPROL-XL) 50 MG 24 hr tablet TAKE 1 TABLET (50 MG TOTAL) BY MOUTH DAILY. 08/05/20 08/05/21  Lennette Bihari, MD  Misc. Devices MISC Blood Pressure monitor. Dx: Hypertension 03/12/19   Hoy Register, MD  Vitamin D, Ergocalciferol, (DRISDOL) 1.25 MG (50000 UNIT) CAPS capsule Take 1 capsule (50,000 Units total) by mouth every 7 (seven) days. 11/01/19   Hoy Register, MD    Allergies    Patient has no known allergies.  Review of Systems   Review of Systems  All other systems reviewed and are negative.   Physical Exam Updated Vital Signs BP 112/74   Pulse 95   Temp (!) 101 F (38.3 C) (Oral)   Resp 20   Ht 5\' 6"  (1.676 m)   Wt 99.8 kg   SpO2 98%   BMI 35.51 kg/m   Physical Exam Vitals and nursing note reviewed.  Constitutional:      General: She is in acute distress.     Appearance: She is well-developed.  HENT:     Head: Normocephalic and atraumatic.     Right Ear: Tympanic membrane normal.     Left Ear: Tympanic membrane normal.     Nose: Mucosal edema and rhinorrhea present.     Right Turbinates: Enlarged and swollen.     Left Turbinates: Enlarged and swollen.     Mouth/Throat:     Mouth: Mucous membranes are moist.     Pharynx: No oropharyngeal exudate or posterior oropharyngeal erythema.  Eyes:     Pupils: Pupils are equal, round, and reactive to light.  Cardiovascular:     Rate and Rhythm: Normal rate and regular rhythm.     Pulses: Normal pulses.     Heart sounds: Normal heart sounds. No murmur heard. No friction rub.  Pulmonary:     Effort: Pulmonary effort is normal.     Breath sounds: Normal breath sounds. No wheezing or rales.  Chest:     Chest wall: No tenderness.  Abdominal:     General: Bowel sounds are normal. There is no distension.     Palpations: Abdomen is soft.     Tenderness: There is no abdominal tenderness. There is no guarding or rebound.  Musculoskeletal:         General: No tenderness. Normal range of motion.     Cervical back: Normal range of motion and neck supple.     Comments: No edema  Skin:    General: Skin is warm and dry.     Findings: No rash.  Neurological:     General: No focal deficit present.     Mental Status: She is alert and oriented to person, place, and time. Mental status is at baseline.     Cranial  Nerves: No cranial nerve deficit.  Psychiatric:        Mood and Affect: Mood normal.        Behavior: Behavior normal.        Thought Content: Thought content normal.     ED Results / Procedures / Treatments   Labs (all labs ordered are listed, but only abnormal results are displayed) Labs Reviewed  CBC WITH DIFFERENTIAL/PLATELET - Abnormal; Notable for the following components:      Result Value   RBC 5.76 (*)    MCV 74.0 (*)    MCH 23.4 (*)    RDW 16.6 (*)    All other components within normal limits  BASIC METABOLIC PANEL - Abnormal; Notable for the following components:   Glucose, Bld 105 (*)    All other components within normal limits  SARS CORONAVIRUS 2 (TAT 6-24 HRS)    EKG EKG Interpretation  Date/Time:  Wednesday August 13 2020 18:01:01 EDT Ventricular Rate:  92 PR Interval:  136 QRS Duration: 124 QT Interval:  354 QTC Calculation: 437 R Axis:   -15 Text Interpretation: Normal sinus rhythm Right bundle branch block No significant change since last tracing Confirmed by Gwyneth Sprout ([id number]) on 08/13/2020 9:27:16 PM   Radiology DG Chest 2 View  Result Date: 08/13/2020 CLINICAL DATA:  Productive cough, RIGHT-side chest pain, shortness of breath with chest pain and headache since Monday, history hypertension, atrial fibrillation EXAM: CHEST - 2 VIEW COMPARISON:  04/13/2019 FINDINGS: Normal heart size and pulmonary vascularity. Enlargement of the RIGHT hilum, question adenopathy versus mass. Lungs clear. No pleural effusion or pneumothorax. Atherosclerotic calcification aorta noted. No acute osseous  findings. IMPRESSION: Enlargement of the RIGHT pulmonary hilum question mass versus adenopathy; CT chest with contrast recommended for further assessment. Electronically Signed   By: Ulyses Southward M.D.   On: 08/13/2020 19:05    Procedures Procedures   Medications Ordered in ED Medications  acetaminophen (TYLENOL) tablet 1,000 mg (1,000 mg Oral Given 08/13/20 2148)    ED Course  I have reviewed the triage vital signs and the nursing notes.  Pertinent labs & imaging results that were available during my care of the patient were reviewed by me and considered in my medical decision making (see chart for details).    MDM Rules/Calculators/A&P                          Pt with symptoms consistent with viral URI/COVID.  Well appearing here but did develop a fever of 101.  No signs of breathing difficulty but a little bit at home.  No signs of pharyngitis, otitis or abnormal abdominal findings.   CXR while in triage showed enlargement of right pulm hilus question mass vs adenopathy and recommended CT of the chest.  Since pt is having pain in this area will get CT.  She is sating 98% on RA.  EKG without acute findings and low suspicion for endocarditis, myocarditis or pericarditis.  CBC and BMP wnl.  Covid pending. pt to return with any further problems.  11:16 PM CT neg for acute process.  Finding discussed with pt.  Feel this is viral illness.  Will treat with steroids due to cough and prior hx of needing inhaler.  Also given tessalon pearls.  Pt given ppx for abx but instructed to hold it for another 2 days and if still having fever by Friday then she could take it.  MDM Number of Diagnoses or Management  Options   Amount and/or Complexity of Data Reviewed Clinical lab tests: reviewed and ordered Tests in the radiology section of CPT: ordered and reviewed Tests in the medicine section of CPT: ordered and reviewed Independent visualization of images, tracings, or specimens: yes  Risk of  Complications, Morbidity, and/or Mortality Presenting problems: moderate Diagnostic procedures: moderate Management options: low  Patient Progress Patient progress: stable   Final Clinical Impression(s) / ED Diagnoses Final diagnoses:  Viral upper respiratory tract infection    Rx / DC Orders ED Discharge Orders         Ordered    predniSONE (DELTASONE) 20 MG tablet  Daily        08/13/20 2319    benzonatate (TESSALON) 100 MG capsule  Every 8 hours        08/13/20 2319    azithromycin (ZITHROMAX) 250 MG tablet        08/13/20 2319           Gwyneth SproutPlunkett, Wilma Wuthrich, MD 08/13/20 2308    Gwyneth SproutPlunkett, Amandalynn Pitz, MD 08/13/20 2321

## 2020-08-13 NOTE — Discharge Instructions (Addendum)
Covid test should be back tomorrow.  You can continue to take tylenol as needed for fever and you can use you inhaler as needed for coughing or wheezing.  Hold the antibiotic and do not fill unless you are still having fever by Friday.  If the COVID test is positive then you do not need the antibiotic at all.

## 2020-08-14 ENCOUNTER — Other Ambulatory Visit: Payer: Self-pay

## 2020-08-14 LAB — SARS CORONAVIRUS 2 (TAT 6-24 HRS): SARS Coronavirus 2: NEGATIVE

## 2020-08-15 ENCOUNTER — Other Ambulatory Visit: Payer: Self-pay

## 2020-08-19 ENCOUNTER — Ambulatory Visit: Payer: Self-pay | Admitting: *Deleted

## 2020-08-19 NOTE — Telephone Encounter (Signed)
No inhaler on medication list.  Can you fill for patient.

## 2020-08-19 NOTE — Telephone Encounter (Signed)
Pt seen ED on 08/13/2020 for URI symptoms.   She has completed the prednisone and antibiotic that were prescribed.   The ED dr told her to use her albuterol inhaler however she doesn't have one.   She is wheezing and has a tight cough.   "I feel like the congestion is stuck in my chest".   Coughing a lot at night.   Started Mucinex for chest congestion yesterday but hasn't helped.  She is requesting that Dr. Alvis Lemmings prescribe her an albuterol inhaler.   "Dr. Alvis Lemmings knows me and that I go through this every allergy season".   "Would she call in an inhaler for me to the pharmacy there in your office?"  I let pt know I would send Dr. Alvis Lemmings a high priority note with her request.   Pt was agreeable to someone calling her back with Dr. Baxter Flattery response.  She can be reached on her cell at [phone].  I sent my notes to [facility] and Wellness high priority for Dr. Alvis Lemmings.    Reason for Disposition . [1] Continuous (nonstop) coughing AND [2] keeps from working or sleeping    Hard to sleep at night and the pollen is causing her to wheeze.   Requesting an albuterol inhaler.   She goes through this every year.  "Dr. Alvis Lemmings know me and how I get this way every year".  Answer Assessment - Initial Assessment Questions 1. RESPIRATORY STATUS: "Describe your breathing?" (e.g., wheezing, shortness of breath, unable to speak, severe coughing)      I went to the ED for URI.   I've taken the prednisone and azithromycin.    On Sat. The wheezing is worse.   In my chest it's not coming up.   Yesterday I got some Mucinex for chest congestion and started it.    I'm still congested in my chest.    I didn't get the Tessilon Perles due to the side effects and it cost $60. I need an inhaler.   Only time I need an inhaler is during allergy season. 2. ONSET: "When did this breathing problem begin?"      I was seen in the ED last week.    3. PATTERN "Does the difficult breathing come and go, or has it been  constant since it started?"      I'm wheezing.   This is the worst I've ever felt due to the pollen. 4. SEVERITY: "How bad is your breathing?" (e.g., mild, moderate, severe)    - MILD: No SOB at rest, mild SOB with walking, speaks normally in sentences, can lay down, no retractions, pulse < 100.    - MODERATE: SOB at rest, SOB with minimal exertion and prefers to sit, cannot lie down flat, speaks in phrases, mild retractions, audible wheezing, pulse 100-120.    - SEVERE: Very SOB at rest, speaks in single words, struggling to breathe, sitting hunched forward, retractions, pulse > 120      Moderate 5. RECURRENT SYMPTOM: "Have you had difficulty breathing before?" If Yes, ask: "When was the last time?" and "What happened that time?"      Yes   Every allergy season.  I need an albuterol inhaler.   6. CARDIAC HISTORY: "Do you have any history of heart disease?" (e.g., heart attack, angina, bypass surgery, angioplasty)      Not asked since seen recently in ED 7. LUNG HISTORY: "Do you have any history of lung disease?"  (e.g., pulmonary embolus, asthma,  emphysema)     Every spring she has this problem with pollen making her wheeze.   She is requesting an albuterol inhaler because she doesn't have one. 8. CAUSE: "What do you think is causing the breathing problem?"      The pollen   This happens every year. 9. OTHER SYMPTOMS: "Do you have any other symptoms? (e.g., dizziness, runny nose, cough, chest pain, fever)     Tight cough especially at night.   It's hard to sleep and I just don't feel good. 10. PREGNANCY: "Is there any chance you are pregnant?" "When was your last menstrual period?"       N/A due to age 70. TRAVEL: "Have you traveled out of the country in the last month?" (e.g., travel history, exposures)       No  Protocols used: BREATHING DIFFICULTY-A-AH

## 2020-08-20 ENCOUNTER — Other Ambulatory Visit: Payer: Self-pay

## 2020-08-20 ENCOUNTER — Telehealth: Payer: Self-pay | Admitting: Critical Care Medicine

## 2020-08-20 MED ORDER — ALBUTEROL SULFATE HFA 108 (90 BASE) MCG/ACT IN AERS
2.0000 | INHALATION_SPRAY | Freq: Four times a day (QID) | RESPIRATORY_TRACT | 4 refills | Status: DC | PRN
  Filled 2020-08-20: qty 8.5, 25d supply, fill #0
  Filled 2021-03-11: qty 8.5, 25d supply, fill #1

## 2020-08-20 NOTE — Telephone Encounter (Signed)
Pt is requesting inhaler.

## 2020-08-20 NOTE — Telephone Encounter (Signed)
Pt was called and informed of inhaler being sent to pharmacy.

## 2020-08-20 NOTE — Telephone Encounter (Signed)
rx for inhaler sent.  Could not access original encounter

## 2020-09-03 ENCOUNTER — Telehealth: Payer: Self-pay | Admitting: Family Medicine

## 2020-09-03 NOTE — Telephone Encounter (Signed)
Copied from CRM [id number]. Topic: General - Other >> Sep 03, 2020 11:27 AM Louie Bun, Rosey Bath D wrote: Reason for CRM: Patient called and would like to speak with her pcp nurse about a continue cough she has and it bothers her in the afternoon and night. She wants to know if there is anything else she can take for it. She also wants to know more about a 2nd booster shot. Please call patient back, thanks.

## 2020-09-05 NOTE — Telephone Encounter (Signed)
Yes she can.

## 2020-09-05 NOTE — Telephone Encounter (Signed)
Can pt be put in 8:10 or 1:30 slot to discuss

## 2020-09-05 NOTE — Telephone Encounter (Signed)
Pt states that she feel better and does not need the appointment.

## 2020-09-09 DIAGNOSIS — Z23 Encounter for immunization: Secondary | ICD-10-CM | POA: Diagnosis not present

## 2020-10-10 ENCOUNTER — Other Ambulatory Visit: Payer: Self-pay

## 2020-10-10 MED FILL — Metoprolol Succinate Tab ER 24HR 50 MG (Tartrate Equiv): ORAL | 90 days supply | Qty: 90 | Fill #0 | Status: AC

## 2020-10-17 DIAGNOSIS — R7309 Other abnormal glucose: Secondary | ICD-10-CM | POA: Diagnosis not present

## 2020-10-17 DIAGNOSIS — Z961 Presence of intraocular lens: Secondary | ICD-10-CM | POA: Diagnosis not present

## 2020-10-17 DIAGNOSIS — H26492 Other secondary cataract, left eye: Secondary | ICD-10-CM | POA: Diagnosis not present

## 2020-10-17 DIAGNOSIS — H2511 Age-related nuclear cataract, right eye: Secondary | ICD-10-CM | POA: Diagnosis not present

## 2020-10-21 ENCOUNTER — Other Ambulatory Visit: Payer: Self-pay

## 2020-10-29 ENCOUNTER — Encounter: Payer: Self-pay | Admitting: Cardiovascular Disease

## 2020-11-04 ENCOUNTER — Telehealth: Payer: Self-pay | Admitting: Family Medicine

## 2020-11-04 NOTE — Telephone Encounter (Signed)
Copied from CRM [id number]. Topic: Appointment Scheduling - Scheduling Inquiry for Clinic >> Nov 04, 2020 12:17 PM Randol Kern wrote: Reason for CRM: Pt called to schedule MWV, initial. Called office, please advise  Best contact: [phone]  Do we need to get this patient scheduled for a medicare wellness. Let me know and I will call back to schedule the appointment.

## 2020-11-05 NOTE — Telephone Encounter (Signed)
Patient called in checking on status if she is getting medicare well visit scheduled. Please call back

## 2020-11-11 ENCOUNTER — Other Ambulatory Visit: Payer: Self-pay

## 2020-11-11 MED FILL — Metformin HCl Tab 500 MG: ORAL | 90 days supply | Qty: 90 | Fill #0 | Status: AC

## 2020-11-11 MED FILL — Linaclotide Cap 145 MCG: ORAL | 90 days supply | Qty: 90 | Fill #0 | Status: AC

## 2020-11-11 MED FILL — Atorvastatin Calcium Tab 20 MG (Base Equivalent): ORAL | 90 days supply | Qty: 90 | Fill #0 | Status: AC

## 2020-11-19 ENCOUNTER — Ambulatory Visit
Admission: RE | Admit: 2020-11-19 | Discharge: 2020-11-19 | Disposition: A | Discharge status: Home / Self Care | Payer: Medicare Other | Source: Ambulatory Visit | Attending: Family Medicine | Admitting: Family Medicine

## 2020-11-19 ENCOUNTER — Other Ambulatory Visit: Payer: Self-pay

## 2020-11-19 DIAGNOSIS — Z1231 Encounter for screening mammogram for malignant neoplasm of breast: Secondary | ICD-10-CM | POA: Diagnosis not present

## 2020-12-04 ENCOUNTER — Encounter: Payer: Medicare Other | Admitting: Family Medicine

## 2020-12-17 ENCOUNTER — Telehealth: Payer: Self-pay | Admitting: Family Medicine

## 2020-12-17 NOTE — Telephone Encounter (Signed)
Per Kayla Preston patient will need to be scheduled with Dr. Alvis Lemmings for Medicare Wellness visit. Patient has appt on 8/25 and left vm that she will need to be rescheduled and to call [phone]

## 2021-01-01 ENCOUNTER — Encounter: Payer: Medicare Other | Admitting: Physician Assistant

## 2021-01-02 ENCOUNTER — Telehealth: Payer: Self-pay | Admitting: Family Medicine

## 2021-01-02 ENCOUNTER — Other Ambulatory Visit: Payer: Self-pay

## 2021-01-02 MED FILL — Hydrochlorothiazide Tab 25 MG: ORAL | 90 days supply | Qty: 90 | Fill #0 | Status: AC

## 2021-01-02 NOTE — Telephone Encounter (Signed)
Copied from CRM [id number]. Topic: General - Other >> Jan 02, 2021 12:12 PM Jaquita Rector A wrote: Reason for CRM: Patient called in to speak to Dr Earley Abide nurse about getting scheduled for the Shingles vaccine would like a call back at Ph# [phone]   Let me know when this patient should be scheduled for the shingle vaccine.

## 2021-01-02 NOTE — Telephone Encounter (Signed)
Pt has been scheduled for shingles vaccine.

## 2021-01-05 ENCOUNTER — Other Ambulatory Visit: Payer: Self-pay

## 2021-01-07 ENCOUNTER — Ambulatory Visit: Payer: Medicare Other | Attending: Family Medicine

## 2021-01-07 ENCOUNTER — Other Ambulatory Visit: Payer: Self-pay

## 2021-01-07 DIAGNOSIS — Z23 Encounter for immunization: Secondary | ICD-10-CM

## 2021-01-07 MED ORDER — SHINGRIX 50 MCG/0.5ML IM SUSR
0.5000 mL | Freq: Once | INTRAMUSCULAR | 1 refills | Status: AC
  Filled 2021-01-07 (×2): qty 0.5, 1d supply, fill #0
  Filled 2021-03-11: qty 0.5, 1d supply, fill #1

## 2021-01-07 MED ORDER — SHINGRIX 50 MCG/0.5ML IM SUSR: 0.5000 mL | Freq: Once | INTRAMUSCULAR | 1 refills | Status: DC

## 2021-01-07 MED ORDER — SHINGRIX 50 MCG/0.5ML IM SUSR: 0.5000 mL | Freq: Once | INTRAMUSCULAR | 0 refills | Status: DC

## 2021-01-09 NOTE — Progress Notes (Signed)
Pt came into the office for shingrix vaccine.   Vaccine was given by pharmacist

## 2021-01-21 ENCOUNTER — Other Ambulatory Visit: Payer: Self-pay

## 2021-01-21 MED FILL — Metoprolol Succinate Tab ER 24HR 50 MG (Tartrate Equiv): ORAL | 90 days supply | Qty: 90 | Fill #1 | Status: AC

## 2021-01-23 ENCOUNTER — Other Ambulatory Visit (HOSPITAL_BASED_OUTPATIENT_CLINIC_OR_DEPARTMENT_OTHER): Payer: Self-pay

## 2021-02-16 ENCOUNTER — Other Ambulatory Visit: Payer: Self-pay

## 2021-02-16 MED FILL — Linaclotide Cap 145 MCG: ORAL | 90 days supply | Qty: 90 | Fill #1 | Status: AC

## 2021-02-17 ENCOUNTER — Other Ambulatory Visit: Payer: Self-pay

## 2021-03-11 ENCOUNTER — Other Ambulatory Visit: Payer: Self-pay

## 2021-03-11 ENCOUNTER — Ambulatory Visit: Payer: Medicare Other | Attending: Family Medicine | Admitting: Family Medicine

## 2021-03-11 ENCOUNTER — Encounter: Payer: Self-pay | Admitting: Family Medicine

## 2021-03-11 VITALS — BP 127/80 | HR 85 | Ht 66.0 in | Wt 209.6 lb

## 2021-03-11 DIAGNOSIS — Z0001 Encounter for general adult medical examination with abnormal findings: Secondary | ICD-10-CM | POA: Diagnosis not present

## 2021-03-11 DIAGNOSIS — M545 Low back pain, unspecified: Secondary | ICD-10-CM

## 2021-03-11 DIAGNOSIS — Z23 Encounter for immunization: Secondary | ICD-10-CM | POA: Diagnosis not present

## 2021-03-11 DIAGNOSIS — G8929 Other chronic pain: Secondary | ICD-10-CM

## 2021-03-11 DIAGNOSIS — Z0000 Encounter for general adult medical examination without abnormal findings: Secondary | ICD-10-CM

## 2021-03-11 DIAGNOSIS — M65342 Trigger finger, left ring finger: Secondary | ICD-10-CM

## 2021-03-11 NOTE — Progress Notes (Signed)
Subjective:   Kayla Preston is a 70 y.o. female who presents for Medicare Annual (Subsequent) preventive examination.  She complains her ring finger sticks and she has to use her right hand to extend it with associated pain on the palmar aspect of the base of her finger. Her lower back hurts on rising from a seated position and symptoms have been present for over 6 months.  She has no pain radiating to her legs.  She undergoes regular exercise at the [facility].  Review of Systems    General: negative for fever, weight loss, appetite change Eyes: no visual symptoms. ENT: no ear symptoms, no sinus tenderness, no nasal congestion or sore throat. Neck: no pain  Respiratory: no wheezing, shortness of breath, cough Cardiovascular: no chest pain, no dyspnea on exertion, no pedal edema, no orthopnea. Gastrointestinal: no abdominal pain, no diarrhea, no constipation Genito-Urinary: no urinary frequency, no dysuria, no polyuria. Hematologic: no bruising Endocrine: no cold or heat intolerance Neurological: no headaches, no seizures, no tremors Musculoskeletal:see HPI Skin: no pruritus, no rash. Psychological: no depression, no anxiety,         Objective:    Today's Vitals   03/11/21 1520  BP: 127/80  Pulse: 85  SpO2: 100%  Weight: 209 lb 9.6 oz (95.1 kg)  Height: 5\' 6"  (1.676 m)   Body mass index is 33.83 kg/m.  Advanced Directives 03/11/2021 08/13/2020 03/28/2020 10/31/2019 03/06/2018 06/16/2016 05/17/2016  Does Patient Have a Medical Advance Directive? No No No No No No No  Would patient like information on creating a medical advance directive? - - No - Patient declined - - No - Patient declined No - Patient declined    Current Medications (verified) Outpatient Encounter Medications as of 03/11/2021  Medication Sig   albuterol (PROAIR HFA) 108 (90 Base) MCG/ACT inhaler Inhale 2 puffs into the lungs every 6 (six) hours as needed.   aspirin EC 81 MG tablet Take 81 mg by mouth  daily.   atorvastatin (LIPITOR) 20 MG tablet TAKE 1 TABLET (20 MG TOTAL) BY MOUTH DAILY.   hydrochlorothiazide (HYDRODIURIL) 25 MG tablet TAKE 0.5 TABLETS (12.5 MG TOTAL) BY MOUTH EVERY OTHER DAY.   linaclotide (LINZESS) 145 MCG CAPS capsule TAKE 1 CAPSULE BY MOUTH AT LEAST 30 MINUTES BEFORE THE FIRST MEAL OF THE DAY ON AN EMPTY STOMACH   metoprolol succinate (TOPROL-XL) 50 MG 24 hr tablet TAKE 1 TABLET (50 MG TOTAL) BY MOUTH DAILY.   linaclotide (LINZESS) 145 MCG CAPS capsule TAKE 1 CAPSULE BY MOUTH AT LEAST 30 MINUTES BEFORE THE FIRST MEAL OF THE DAY ON AN EMPTY STOMACH   [DISCONTINUED] azithromycin (ZITHROMAX) 250 MG tablet Take 2 tabs (500mg ) on the first day and then 1 tab daily until done   [DISCONTINUED] benzonatate (TESSALON) 100 MG capsule Take 1 capsule (100 mg total) by mouth every 8 (eight) hours.   [DISCONTINUED] Cyanocobalamin (VITAMIN B 12 PO) Take 1 tablet by mouth daily. Reported on 07/22/2015   [DISCONTINUED] LINZESS 145 MCG CAPS capsule Take 1 capsule by mouth daily.   [DISCONTINUED] metFORMIN (GLUCOPHAGE) 500 MG tablet TAKE 1 TABLET (500 MG TOTAL) BY MOUTH DAILY WITH BREAKFAST.   [DISCONTINUED] methimazole (TAPAZOLE) 5 MG tablet Take 1 tablet (5 mg total) by mouth daily.   [DISCONTINUED] methocarbamol (ROBAXIN) 500 MG tablet Take 1 tablet (500 mg total) by mouth every 8 (eight) hours as needed for muscle spasms.   [DISCONTINUED] Misc. Devices MISC Blood Pressure monitor. Dx: Hypertension   [DISCONTINUED] predniSONE (DELTASONE) 20  MG tablet Take 2 tablets (40 mg total) by mouth daily.   [DISCONTINUED] Vitamin D, Ergocalciferol, (DRISDOL) 1.25 MG (50000 UNIT) CAPS capsule Take 1 capsule (50,000 Units total) by mouth every 7 (seven) days.   No facility-administered encounter medications on file as of 03/11/2021.    Allergies (verified) Patient has no known allergies.   History: Past Medical History:  Diagnosis Date   Atrial fibrillation ([place])    Atrial fibrillation ([place])     she had ablation which resolved intermittent afib   Hypertension    Obesity    Seasonal allergies    Past Surgical History:  Procedure Laterality Date   ABLATION     Family History  Problem Relation Age of Onset   Heart disease Mother    Heart disease Father    Hyperlipidemia Sister    Thyroid disease Neg Hx    Social History   Socioeconomic History   Marital status: Single    Spouse name: Not on file   Number of children: Not on file   Years of education: Not on file   Highest education level: Not on file  Occupational History   Not on file  Tobacco Use   Smoking status: Never   Smokeless tobacco: Never  Substance and Sexual Activity   Alcohol use: No   Drug use: No   Sexual activity: Not Currently  Other Topics Concern   Not on file  Social History Narrative   ** Merged History Encounter **       Social Determinants of Health   Financial Resource Strain: Not on file  Food Insecurity: Not on file  Transportation Needs: Not on file  Physical Activity: Not on file  Stress: Not on file  Social Connections: Not on file    Tobacco Counseling Counseling given: Not Answered   Clinical Intake:  Pre-visit preparation completed: Yes  Pain : No/denies pain     Diabetes: No     Diabetic?No  Interpreter Needed?: No      Activities of Daily Living In your present state of health, do you have any difficulty performing the following activities: 03/11/2021  Hearing? N  Vision? Y  Difficulty concentrating or making decisions? N  Walking or climbing stairs? N  Dressing or bathing? N  Doing errands, shopping? N  Preparing Food and eating ? N  Using the Toilet? N  In the past six months, have you accidently leaked urine? N  Do you have problems with loss of bowel control? N  Managing your Medications? N  Managing your Finances? N  Housekeeping or managing your Housekeeping? N  Some recent data might be hidden    Patient Care Team: [name],  MD as PCP - General (Family Medicine)  Indicate any recent Medical Services you may have received from other than [facility] providers in the past year (date may be approximate).     Assessment:   This is a routine wellness examination for Syndi.  Hearing/Vision screen Vision Screening   Right eye Left eye Both eyes  Without correction 20/50 20/20   With correction       Dietary issues and exercise activities discussed:     Goals Addressed   None    Depression Screen PHQ 2/9 Scores 03/11/2021 10/31/2019 07/27/2018 06/28/2018 03/13/2018 11/18/2017 09/13/2017  PHQ - 2 Score 0 0 0 0 0 0 0  PHQ- 9 Score - 0 0 - 0 - 0    Fall Risk Fall Risk  03/11/2021 05/15/2020 03/05/2020  10/31/2019 03/12/2019  Falls in the past year? 0 0 0 0 0  Number falls in past yr: 0 0 - - -  Injury with Fall? 0 0 - - -  Risk for fall due to : - - - No Fall Risks -    FALL RISK PREVENTION PERTAINING TO THE HOME:  Any stairs in or around the home? No  If so, are there any without handrails? No  Home free of loose throw rugs in walkways, pet beds, electrical cords, etc? Yes  Adequate lighting in your home to reduce risk of falls? Yes   ASSISTIVE DEVICES UTILIZED TO PREVENT FALLS:  Life alert? No  Use of a cane, walker or w/c? No  Grab bars in the bathroom? Yes  Shower chair or bench in shower? No  Elevated toilet seat or a handicapped toilet? No   TIMED UP AND GO:  Was the test performed? Yes .  Length of time to ambulate 10 feet: 7 sec.    Gait slow and steady without use of assistive device  Cognitive Function:        Immunizations Immunization History  Administered Date(s) Administered   Influenza,inj,Quad PF,6+ Mos 06/03/2016, 06/15/2017, 01/11/2018, 02/06/2019, 05/15/2020   PFIZER(Purple Top)SARS-COV-2 Vaccination 07/03/2019, 07/24/2019, 02/12/2020, 09/09/2020   Pneumococcal Conjugate-13 03/13/2019   Pneumococcal Polysaccharide-23 05/15/2020     Qualifies for Shingles Vaccine? Yes   Zostavax  completed Yes   Shingrix Completed?: Yes  Screening Tests Health Maintenance  Topic Date Due   Zoster Vaccines- Shingrix (1 of 2) Never done   COVID-19 Vaccine (5 - Booster for Pfizer series) 11/04/2020   INFLUENZA VACCINE  12/08/2020   MAMMOGRAM  11/20/2022   TETANUS/TDAP  02/26/2025   COLONOSCOPY (Pts 45-46yrs Insurance coverage will need to be confirmed)  11/29/2029   Pneumonia Vaccine 51+ Years old  Completed   DEXA SCAN  Completed   Hepatitis C Screening  Completed   HPV VACCINES  Aged Out    Health Maintenance  Health Maintenance Due  Topic Date Due   Zoster Vaccines- Shingrix (1 of 2) Never done   COVID-19 Vaccine (5 - Booster for Pfizer series) 11/04/2020   INFLUENZA VACCINE  12/08/2020    Colorectal cancer screening: Type of screening: Colonoscopy. Completed 11/2019. Repeat every 10 years  Mammogram status: Completed 11/2020. Repeat every year  Bone Density status: Completed 06/2019. Results reflect: Bone density results: NORMAL. Repeat every   years.   Community Resource Referral / Chronic Care Management: CRR required this visit?  No   CCM required this visit?  No      Plan:    1. Encounter for Medicare annual wellness exam Counseled on 150 minutes of exercise per week, healthy eating (including decreased daily intake of saturated fats, cholesterol, added sugars, sodium), routine healthcare maintenance.   2. Trigger ring finger of left hand She will need Cortisone injection - AMB referral to orthopedics  3. Chronic bilateral low back pain without sciatica Suspicious for OA Consider PT referral after imaging Advised to apply heat or ice whichever is tolerated to painful areas. Counseled on evidence of improvement in pain control with regards to yoga, water aerobics, massage, home physical therapy, exercise as tolerated. - DG Lumbar Spine Complete; Future  4. Need for immunization against influenza - Flu Vaccine QUAD 68mo+IM (Fluarix, Fluzone & Alfiuria  Quad PF)  I have personally reviewed and noted the following in the patient's chart:   Medical and social history Use of alcohol, tobacco or illicit  drugs  Current medications and supplements including opioid prescriptions.  Functional ability and status Nutritional status Physical activity Advanced directives List of other physicians Hospitalizations, surgeries, and ER visits in previous 12 months Vitals Screenings to include cognitive, depression, and falls Referrals and appointments  In addition, I have reviewed and discussed with patient certain preventive protocols, quality metrics, and best practice recommendations. A written personalized care plan for preventive services as well as general preventive health recommendations were provided to patient.     Hoy Register, MD   03/11/2021

## 2021-03-11 NOTE — Patient Instructions (Signed)
  Ms. Purnell Shoemaker , Thank you for taking time to come for your Medicare Wellness Visit. I appreciate your ongoing commitment to your health goals. Please review the following plan we discussed and let me know if I can assist you in the future.   These are the goals we discussed:  Goals   None     This is a list of the screening recommended for you and due dates:  Health Maintenance  Topic Date Due   Zoster (Shingles) Vaccine (1 of 2) Never done   COVID-19 Vaccine (5 - Booster for Pfizer series) 11/04/2020   Flu Shot  12/08/2020   Mammogram  11/20/2022   Tetanus Vaccine  02/26/2025   Colon Cancer Screening  11/29/2029   Pneumonia Vaccine  Completed   DEXA scan (bone density measurement)  Completed   Hepatitis C Screening: USPSTF Recommendation to screen - Ages 29-79 yo.  Completed   HPV Vaccine  Aged Out

## 2021-03-17 ENCOUNTER — Ambulatory Visit: Payer: Medicare Other | Admitting: Orthopedic Surgery

## 2021-03-18 ENCOUNTER — Ambulatory Visit: Payer: Medicare Other | Admitting: Family Medicine

## 2021-03-23 ENCOUNTER — Other Ambulatory Visit: Payer: Self-pay

## 2021-03-24 ENCOUNTER — Encounter: Payer: Self-pay | Admitting: Orthopedic Surgery

## 2021-03-24 ENCOUNTER — Ambulatory Visit (INDEPENDENT_AMBULATORY_CARE_PROVIDER_SITE_OTHER): Payer: Medicare Other | Admitting: Orthopedic Surgery

## 2021-03-24 ENCOUNTER — Other Ambulatory Visit: Payer: Self-pay

## 2021-03-24 DIAGNOSIS — M65341 Trigger finger, right ring finger: Secondary | ICD-10-CM

## 2021-03-24 MED ORDER — LIDOCAINE HCL 1 % IJ SOLN
1.0000 mL | INTRAMUSCULAR | Status: AC | PRN
  Administered 2021-03-24: 1 mL

## 2021-03-24 MED ORDER — BETAMETHASONE SOD PHOS & ACET 6 (3-3) MG/ML IJ SUSP
6.0000 mg | INTRAMUSCULAR | Status: AC | PRN
  Administered 2021-03-24: 6 mg via INTRA_ARTICULAR

## 2021-03-24 NOTE — Progress Notes (Signed)
Office Visit Note   Patient: Kayla Preston           Date of Birth: [birth year]           MRN: [id number] Visit Date: 03/24/2021              Requested by: [name], MD [place],  [place] [address] PCP: [name], MD   Assessment & Plan: Visit Diagnoses:  1. Trigger finger, right ring finger     Plan: We discussed the diagnosis, prognosis, non-operative and operative treatment options for trigger finger.  After our discussion, the patient would like to proceed with corticosteroid injection.  We reviewed the risks and benefits of conservative management.  The patient expressed understanding of the reasoning and strategy going forward.  All patient questions and concerns were addressed.    Follow-Up Instructions: No follow-ups on file.   Orders:  No orders of the defined types were placed in this encounter.  No orders of the defined types were placed in this encounter.     Procedures: Hand/UE Inj: R ring A1 for trigger finger on 03/24/2021 9:10 AM Indications: pain Details: 25 G needle, volar approach Medications: 1 mL lidocaine 1 %; 6 mg betamethasone acetate-betamethasone sodium phosphate 6 (3-3) MG/ML Outcome: tolerated well, no immediate complications Procedure, treatment alternatives, risks and benefits explained, specific risks discussed. Consent was given by the patient. Patient was prepped and draped in the usual sterile fashion.      Clinical Data: No additional findings.   Subjective: Chief Complaint  Patient presents with   Left Ring Finger - Pain    Trigger finger, RT handed, Pain:9/10, hx of it x 2 yrs ago,     This is a 70 yo RHD F who presents with painful triggering of the right ring finger for the last two months.  She notes that this actually started years ago but resolved completely until two months ago.  It has gotten worse.  She has soreness at the level of the ring finger MP joint.  It often gets stuck in a  flexed position and occasionally requires her to manually extend the finger with the other hand.  She denies trigger in any other digit.    Review of Systems   Objective: Vital Signs: BP 125/81 (BP Location: Left Arm, Patient Position: Sitting)   Pulse 87   Ht 5\' 6"  (1.676 m)   Wt 210 lb (95.3 kg)   BMI 33.89 kg/m   Physical Exam Constitutional:      Appearance: Normal appearance.  Cardiovascular:     Rate and Rhythm: Normal rate.     Pulses: Normal pulses.  Pulmonary:     Effort: Pulmonary effort is normal.  Skin:    General: Skin is warm and dry.     Capillary Refill: Capillary refill takes less than 2 seconds.  Neurological:     Mental Status: She is alert.    Right Hand Exam   Tenderness  Right hand tenderness location: TTP at ring finger A1 pulley.  Range of Motion  The patient has normal right wrist ROM.   Muscle Strength  The patient has normal right wrist strength.  Other  Erythema: absent Sensation: normal Pulse: present  Comments:  Palpable and visible triggering of ring finger     Specialty Comments:  No specialty comments available.  Imaging: No results found.   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   Supraventricular tachycardia ([facility]) 07/05/2011   Past Medical History:  Diagnosis Date   Atrial fibrillation ([facility])    Atrial fibrillation ([facility])    she had ablation which resolved intermittent afib   Hypertension    Obesity    Seasonal allergies     Family History   Problem Relation Age of Onset   Heart disease Mother    Heart disease Father    Hyperlipidemia Sister    Thyroid disease Neg Hx     Past Surgical History:  Procedure Laterality Date   ABLATION     Social History   Occupational History   Not on file  Tobacco Use   Smoking status: Never   Smokeless tobacco: Never  Substance and Sexual Activity   Alcohol use: No   Drug use: No   Sexual activity: Not Currently

## 2021-03-25 DIAGNOSIS — Z23 Encounter for immunization: Secondary | ICD-10-CM | POA: Diagnosis not present

## 2021-04-06 ENCOUNTER — Telehealth: Payer: Self-pay | Admitting: Family Medicine

## 2021-04-06 ENCOUNTER — Other Ambulatory Visit: Payer: Self-pay

## 2021-04-06 MED FILL — Hydrochlorothiazide Tab 25 MG: ORAL | 90 days supply | Qty: 45 | Fill #1 | Status: AC

## 2021-04-06 NOTE — Telephone Encounter (Signed)
error 

## 2021-04-14 DIAGNOSIS — Z86010 Personal history of colonic polyps: Secondary | ICD-10-CM | POA: Diagnosis not present

## 2021-04-14 DIAGNOSIS — K5909 Other constipation: Secondary | ICD-10-CM | POA: Diagnosis not present

## 2021-04-29 ENCOUNTER — Other Ambulatory Visit: Payer: Self-pay | Admitting: Cardiovascular Disease

## 2021-04-29 ENCOUNTER — Other Ambulatory Visit: Payer: Self-pay

## 2021-04-29 DIAGNOSIS — I10 Essential (primary) hypertension: Secondary | ICD-10-CM

## 2021-04-29 MED ORDER — METOPROLOL SUCCINATE ER 50 MG PO TB24
ORAL_TABLET | Freq: Every day | ORAL | 1 refills | Status: DC
  Filled 2021-04-29 – 2021-07-31 (×2): qty 90, 90d supply, fill #0

## 2021-04-30 ENCOUNTER — Other Ambulatory Visit: Payer: Self-pay

## 2021-05-01 ENCOUNTER — Other Ambulatory Visit: Payer: Self-pay

## 2021-05-06 ENCOUNTER — Other Ambulatory Visit: Payer: Self-pay

## 2021-05-06 ENCOUNTER — Encounter: Payer: Self-pay | Admitting: Family Medicine

## 2021-05-06 ENCOUNTER — Ambulatory Visit: Payer: Medicare Other | Attending: Family Medicine | Admitting: Family Medicine

## 2021-05-06 VITALS — BP 145/88 | HR 71 | Ht 66.0 in | Wt 208.2 lb

## 2021-05-06 DIAGNOSIS — R590 Localized enlarged lymph nodes: Secondary | ICD-10-CM | POA: Diagnosis not present

## 2021-05-06 DIAGNOSIS — I10 Essential (primary) hypertension: Secondary | ICD-10-CM

## 2021-05-06 DIAGNOSIS — R7303 Prediabetes: Secondary | ICD-10-CM

## 2021-05-06 DIAGNOSIS — E669 Obesity, unspecified: Secondary | ICD-10-CM

## 2021-05-06 DIAGNOSIS — E785 Hyperlipidemia, unspecified: Secondary | ICD-10-CM | POA: Diagnosis not present

## 2021-05-06 LAB — POCT GLYCOSYLATED HEMOGLOBIN (HGB A1C): HbA1c, POC (prediabetic range): 6 % (ref 5.7–6.4)

## 2021-05-06 MED ORDER — TRULICITY 0.75 MG/0.5ML ~~LOC~~ SOAJ
0.7500 mg | SUBCUTANEOUS | 6 refills | Status: DC
  Filled 2021-05-06 – 2021-06-08 (×2): qty 2, 28d supply, fill #0
  Filled 2021-07-09: qty 2, 28d supply, fill #1
  Filled 2021-08-03: qty 2, 28d supply, fill #2
  Filled 2021-09-07 (×2): qty 2, 28d supply, fill #3

## 2021-05-06 NOTE — Progress Notes (Signed)
Has knot in on left chin. Discuss weight loss injection.

## 2021-05-06 NOTE — Progress Notes (Signed)
Subjective:  Patient ID: Kayla Preston, female    DOB: 1951/02/10  Age: 70 y.o. MRN: [id number]  CC: Hypertension   HPI Kayla Preston is a 70 y.o. year old female with a history of prediabetes (A1c 6.0), hypertension, hyperthyroidism previous history of SVT, history of COVID-19 in 03/2019 (status post monoclonal antibody infusion) seen for a follow up visit.   Interval History: Kayla Preston has not been taking her atorvastatin for hyperlipidemia but endorses compliance with hydrochlorothiazide and metoprolol for her hypertension Except for today when Kayla Preston did not take her antihypertensive as Kayla Preston was fasting in anticipation of blood work. Kayla Preston did see GI due to chronic constipation and was placed on Linzess. Today Kayla Preston would like to discuss possible weight loss injections as Kayla Preston is trying to eat right and be more active but is not losing more weight.  Kayla Preston noticed a left submandibular swelling x2 days and it only hurts when Kayla Preston touches it.  Denies presence of fever, tooth ache, otalgia but does have some sniffles.  Past Medical History:  Diagnosis Date   Atrial fibrillation (HCC)    Atrial fibrillation ([facility])    Kayla Preston had ablation which resolved intermittent afib   Hypertension    Obesity    Seasonal allergies     Past Surgical History:  Procedure Laterality Date   ABLATION      Family History  Problem Relation Age of Onset   Heart disease Mother    Heart disease Father    Hyperlipidemia Sister    Thyroid disease Neg Hx     No Known Allergies  Outpatient Medications Prior to Visit  Medication Sig Dispense Refill   albuterol (PROAIR HFA) 108 (90 Base) MCG/ACT inhaler Inhale 2 puffs into the lungs every 6 (six) hours as needed. 8.5 g 4   aspirin EC 81 MG tablet Take 81 mg by mouth daily.     hydrochlorothiazide (HYDRODIURIL) 25 MG tablet TAKE 0.5 TABLETS (12.5 MG TOTAL) BY MOUTH EVERY OTHER DAY. 90 tablet 1   linaclotide (LINZESS) 145 MCG CAPS capsule TAKE 1 CAPSULE BY MOUTH AT  LEAST 30 MINUTES BEFORE THE FIRST MEAL OF THE DAY ON AN EMPTY STOMACH 90 capsule 2   metoprolol succinate (TOPROL-XL) 50 MG 24 hr tablet TAKE 1 TABLET (50 MG TOTAL) BY MOUTH DAILY. 90 tablet 1   atorvastatin (LIPITOR) 20 MG tablet TAKE 1 TABLET (20 MG TOTAL) BY MOUTH DAILY. (Patient not taking: Reported on 05/06/2021) 90 tablet 1   linaclotide (LINZESS) 145 MCG CAPS capsule TAKE 1 CAPSULE BY MOUTH AT LEAST 30 MINUTES BEFORE THE FIRST MEAL OF THE DAY ON AN EMPTY STOMACH 90 capsule 2   No facility-administered medications prior to visit.     ROS Review of Systems  Constitutional:  Negative for activity change, appetite change and fatigue.  HENT:  Negative for congestion, sinus pressure and sore throat.   Eyes:  Negative for visual disturbance.  Respiratory:  Negative for cough, chest tightness, shortness of breath and wheezing.   Cardiovascular:  Negative for chest pain and palpitations.  Gastrointestinal:  Negative for abdominal distention, abdominal pain and constipation.  Endocrine: Negative for polydipsia.  Genitourinary:  Negative for dysuria and frequency.  Musculoskeletal:  Negative for arthralgias and back pain.  Skin:  Negative for rash.  Neurological:  Negative for tremors, light-headedness and numbness.  Hematological:  Does not bruise/bleed easily.  Psychiatric/Behavioral:  Negative for agitation and behavioral problems.    Objective:  BP (!) 145/88 Comment: no medication today  Pulse 71    Ht $R'5\' 6"'pi$  (1.676 m)    Wt 208 lb 3.2 oz (94.4 kg)    SpO2 99%    BMI 33.60 kg/m   BP/Weight 05/06/2021 03/24/2021 21/05/9415  Systolic BP 408 144 818  Diastolic BP 88 81 80  Wt. (Lbs) 208.2 210 209.6  BMI 33.6 33.89 33.83      Physical Exam Constitutional:      Appearance: Kayla Preston is well-developed.  Neck:     Comments: Left slightly tender nonmobile submandibular lymphadenopathy Cardiovascular:     Rate and Rhythm: Normal rate.     Heart sounds: Normal heart sounds. No murmur  heard. Pulmonary:     Effort: Pulmonary effort is normal.     Breath sounds: Normal breath sounds. No wheezing or rales.  Chest:     Chest wall: No tenderness.  Abdominal:     General: Bowel sounds are normal. There is no distension.     Palpations: Abdomen is soft. There is no mass.     Tenderness: There is no abdominal tenderness.  Musculoskeletal:        General: Normal range of motion.     Right lower leg: No edema.     Left lower leg: No edema.  Neurological:     Mental Status: Kayla Preston is alert and oriented to person, place, and time.  Psychiatric:        Mood and Affect: Mood normal.    CMP Latest Ref Rng & Units 08/13/2020 05/15/2020 10/31/2019  Glucose 70 - 99 mg/dL 105(H) 89 92  BUN 8 - 23 mg/dL $Remove'11 14 17  'rVcHgjH$ Creatinine 0.44 - 1.00 mg/dL 0.93 0.72 0.78  Sodium 135 - 145 mmol/L 136 141 143  Potassium 3.5 - 5.1 mmol/L 4.1 4.1 4.0  Chloride 98 - 111 mmol/L 104 107(H) 110(H)  CO2 22 - 32 mmol/L $RemoveB'22 21 20  'XmKEOdsv$ Calcium 8.9 - 10.3 mg/dL 9.1 9.3 10.2  Total Protein 6.0 - 8.5 g/dL - - 7.3  Total Bilirubin 0.0 - 1.2 mg/dL - - 0.3  Alkaline Phos 48 - 121 IU/L - - 94  AST 0 - 40 IU/L - - 21  ALT 0 - 32 IU/L - - 33(H)    Lipid Panel     Component Value Date/Time   CHOL 210 (H) 10/31/2019 1125   TRIG 96 10/31/2019 1125   HDL 67 10/31/2019 1125   CHOLHDL 3.1 10/31/2019 1125   CHOLHDL 3.4 05/17/2016 1004   VLDL 18 05/17/2016 1004   LDLCALC 126 (H) 10/31/2019 1125    CBC    Component Value Date/Time   WBC 6.7 08/13/2020 2128   RBC 5.76 (H) 08/13/2020 2128   HGB 13.5 08/13/2020 2128   HGB 12.9 11/18/2017 1010   HCT 42.6 08/13/2020 2128   HCT 41.8 11/18/2017 1010   PLT 208 08/13/2020 2128   PLT 268 11/18/2017 1010   MCV 74.0 (L) 08/13/2020 2128   MCV 77 (L) 11/18/2017 1010   MCH 23.4 (L) 08/13/2020 2128   MCHC 31.7 08/13/2020 2128   RDW 16.6 (H) 08/13/2020 2128   RDW 17.3 (H) 11/18/2017 1010   LYMPHSABS 1.5 08/13/2020 2128   LYMPHSABS 2.2 11/18/2017 1010   MONOABS 0.8  08/13/2020 2128   EOSABS 0.1 08/13/2020 2128   EOSABS 0.1 11/18/2017 1010   BASOSABS 0.0 08/13/2020 2128   BASOSABS 0.0 11/18/2017 1010    Lab Results  Component Value Date   HGBA1C 6.0 05/06/2021    Assessment & Plan:  1.  Prediabetes Controlled with A1c of 6.0 Placed on Trulicity since Kayla Preston is interested in weight loss Counseled on adverse effects of Trulicity Continue to work on lifestyle modification to prevent progression to type 2 diabetes mellitus - POCT glycosylated hemoglobin (Hb A1C) - LP+Non-HDL Cholesterol - CMP14+EGFR - CBC with Differential/Platelet - Dulaglutide (TRULICITY) 5.83 EN/4.0HW SOPN; Inject 0.75 mg into the skin once a week.  Dispense: 2 mL; Refill: 6  2. Essential hypertension Slightly above goal Kayla Preston is yet to take her antihypertensive Continue current regimen Counseled on blood pressure goal of less than 130/80, low-sodium, DASH diet, medication compliance, 150 minutes of moderate intensity exercise per week. Discussed medication compliance, adverse effects.  3. Mild obesity Placed on Trulicity which will be beneficial Would love to refer to medical weight management but Kayla Preston states due to her work schedule Kayla Preston will be unable to be make it  4. Lymphadenopathy submandibular No evidence of dental infection or ear infection Symptoms have been present for 2 days Continue to monitor and if it progressively increasing in size will consider antibiotic for possible parotitis   Meds ordered this encounter  Medications   Dulaglutide (TRULICITY) 8.08 UP/1.0RP SOPN    Sig: Inject 0.75 mg into the skin once a week.    Dispense:  2 mL    Refill:  6    Follow-up: Return in about 6 months (around 11/04/2021) for Chronic medical conditions.       [name], MD, FAAFP. [facility] and [place], [place]   05/06/2021, 9:21 AM

## 2021-05-07 ENCOUNTER — Telehealth: Payer: Self-pay | Admitting: Family Medicine

## 2021-05-07 ENCOUNTER — Other Ambulatory Visit: Payer: Self-pay

## 2021-05-07 ENCOUNTER — Other Ambulatory Visit: Payer: Self-pay | Admitting: Family Medicine

## 2021-05-07 LAB — CMP14+EGFR
ALT: 17 IU/L (ref 0–32)
AST: 17 IU/L (ref 0–40)
Albumin/Globulin Ratio: 1.8 (ref 1.2–2.2)
Albumin: 4.3 g/dL (ref 3.8–4.8)
Alkaline Phosphatase: 87 IU/L (ref 44–121)
BUN/Creatinine Ratio: 18 (ref 12–28)
BUN: 14 mg/dL (ref 8–27)
Bilirubin Total: 0.3 mg/dL (ref 0.0–1.2)
CO2: 22 mmol/L (ref 20–29)
Calcium: 9.5 mg/dL (ref 8.7–10.3)
Chloride: 109 mmol/L — ABNORMAL HIGH (ref 96–106)
Creatinine, Ser: 0.78 mg/dL (ref 0.57–1.00)
Globulin, Total: 2.4 g/dL (ref 1.5–4.5)
Glucose: 99 mg/dL (ref 70–99)
Potassium: 3.9 mmol/L (ref 3.5–5.2)
Sodium: 143 mmol/L (ref 134–144)
Total Protein: 6.7 g/dL (ref 6.0–8.5)
eGFR: 83 mL/min/{1.73_m2} (ref >59)

## 2021-05-07 LAB — LP+NON-HDL CHOLESTEROL
Cholesterol, Total: 242 mg/dL — ABNORMAL HIGH (ref 100–199)
HDL: 66 mg/dL (ref >39)
LDL Chol Calc (NIH): 167 mg/dL — ABNORMAL HIGH (ref 0–99)
Total Non-HDL-Chol (LDL+VLDL): 176 mg/dL — ABNORMAL HIGH (ref 0–129)
Triglycerides: 58 mg/dL (ref 0–149)
VLDL Cholesterol Cal: 9 mg/dL (ref 5–40)

## 2021-05-07 LAB — CBC WITH DIFFERENTIAL/PLATELET
Basophils Absolute: 0 10*3/uL (ref 0.0–0.2)
Basos: 0 %
EOS (ABSOLUTE): 0.1 10*3/uL (ref 0.0–0.4)
Eos: 2 %
Hematocrit: 42.1 % (ref 34.0–46.6)
Hemoglobin: 13.3 g/dL (ref 11.1–15.9)
Immature Grans (Abs): 0 10*3/uL (ref 0.0–0.1)
Immature Granulocytes: 0 %
Lymphocytes Absolute: 1.7 10*3/uL (ref 0.7–3.1)
Lymphs: 36 %
MCH: 23.4 pg — ABNORMAL LOW (ref 26.6–33.0)
MCHC: 31.6 g/dL (ref 31.5–35.7)
MCV: 74 fL — ABNORMAL LOW (ref 79–97)
Monocytes Absolute: 0.4 10*3/uL (ref 0.1–0.9)
Monocytes: 9 %
Neutrophils Absolute: 2.4 10*3/uL (ref 1.4–7.0)
Neutrophils: 53 %
Platelets: 221 10*3/uL (ref 150–450)
RBC: 5.69 x10E6/uL — ABNORMAL HIGH (ref 3.77–5.28)
RDW: 16.1 % — ABNORMAL HIGH (ref 11.7–15.4)
WBC: 4.6 10*3/uL (ref 3.4–10.8)

## 2021-05-07 MED ORDER — ATORVASTATIN CALCIUM 40 MG PO TABS
40.0000 mg | ORAL_TABLET | Freq: Every day | ORAL | 1 refills | Status: DC
  Filled 2021-05-07: qty 90, 90d supply, fill #0

## 2021-05-07 NOTE — Telephone Encounter (Signed)
Patient called and the line dropped before getting to a nurse, patient called back and left a VM to return the call to the office to speak to a nurse.

## 2021-05-07 NOTE — Telephone Encounter (Signed)
Pt called in for assistance. Pt says that she was seen yesterday. Pt would like to have antibiotic sent in, pt says that she was told to request if her lymph nodes didn't get better. Pt says that they are still swollen.     Pharmacy: [facility] -[address] [place], [place] [address]

## 2021-05-07 NOTE — Telephone Encounter (Signed)
Pt called in still asking for an antibiotic to be sent in, please advise.

## 2021-05-07 NOTE — Telephone Encounter (Signed)
I just saw her yesterday and informed her there is no evidence of infection hence no indication for antibiotics now.  If this persists till early next week then I can send in an antibiotic for her.

## 2021-05-07 NOTE — Telephone Encounter (Addendum)
Left message on voicemail per DPR. Encourage to call if they have additional questions or concerns.   Per Dr. Alvis Lemmings I just saw her yesterday and informed her there is no evidence of infection hence no indication for antibiotics now.  If this persists till early next week then I can send in an antibiotic for her.

## 2021-05-08 ENCOUNTER — Other Ambulatory Visit: Payer: Self-pay

## 2021-05-08 ENCOUNTER — Telehealth: Payer: Self-pay

## 2021-05-08 MED ORDER — AMOXICILLIN 500 MG PO CAPS
500.0000 mg | ORAL_CAPSULE | Freq: Three times a day (TID) | ORAL | 0 refills | Status: AC
  Filled 2021-05-08: qty 30, 10d supply, fill #0

## 2021-05-08 NOTE — Addendum Note (Signed)
Addended by: Hoy Register on: 05/08/2021 12:04 PM   Modules accepted: Orders

## 2021-05-08 NOTE — Telephone Encounter (Signed)
Pt called, pt states that she is till having the lymphoid swelling on the R side and it is bothering her. She states it hasnt went down any and she is afraid its going to get worse before the weekend. She also reports having burning in her mouth and says that all of this was covered with Dr. Alvis Lemmings. Advised I will send message to Dr. Alvis Lemmings and let her know. Gave pt message from Dr. Alvis Lemmings but pt is persistent that abx needs to be called in today and she can come by the office if needed. Medication can be sent to either pharmacy per pt request.

## 2021-05-08 NOTE — Telephone Encounter (Signed)
Antibiotic sent to the pharmacy.

## 2021-05-08 NOTE — Telephone Encounter (Signed)
Called pt made aware of RX sent to pharmacy 

## 2021-06-02 ENCOUNTER — Ambulatory Visit: Payer: Medicare Other | Admitting: Family Medicine

## 2021-06-08 ENCOUNTER — Other Ambulatory Visit: Payer: Self-pay

## 2021-06-09 ENCOUNTER — Other Ambulatory Visit (HOSPITAL_COMMUNITY): Payer: Self-pay

## 2021-06-11 ENCOUNTER — Other Ambulatory Visit: Payer: Self-pay

## 2021-06-11 MED ORDER — LINACLOTIDE 145 MCG PO CAPS
ORAL_CAPSULE | ORAL | 2 refills | Status: DC
  Filled 2021-06-11: qty 90, 90d supply, fill #0

## 2021-06-12 ENCOUNTER — Other Ambulatory Visit: Payer: Self-pay

## 2021-06-15 ENCOUNTER — Other Ambulatory Visit (HOSPITAL_COMMUNITY): Payer: Self-pay

## 2021-07-09 ENCOUNTER — Other Ambulatory Visit: Payer: Self-pay

## 2021-07-09 ENCOUNTER — Other Ambulatory Visit: Payer: Self-pay | Admitting: Cardiovascular Disease

## 2021-07-09 DIAGNOSIS — I10 Essential (primary) hypertension: Secondary | ICD-10-CM

## 2021-07-09 MED ORDER — HYDROCHLOROTHIAZIDE 25 MG PO TABS
25.0000 mg | ORAL_TABLET | Freq: Every day | ORAL | 1 refills | Status: DC
  Filled 2021-07-09 – 2021-07-17 (×2): qty 30, 30d supply, fill #0

## 2021-07-10 ENCOUNTER — Other Ambulatory Visit: Payer: Self-pay

## 2021-07-13 ENCOUNTER — Other Ambulatory Visit: Payer: Self-pay

## 2021-07-17 ENCOUNTER — Telehealth: Payer: Self-pay | Admitting: Cardiovascular Disease

## 2021-07-17 ENCOUNTER — Other Ambulatory Visit: Payer: Self-pay

## 2021-07-17 MED ORDER — HYDROCHLOROTHIAZIDE 25 MG PO TABS
12.5000 mg | ORAL_TABLET | ORAL | 0 refills | Status: DC
  Filled 2021-07-17: qty 30, 90d supply, fill #0

## 2021-07-17 NOTE — Telephone Encounter (Signed)
Last OV with Dr. Tresa Endo 08/05/20-HCTZ decreased to 12.5 mg QOD.   ? ?Upcoming appt 09/2021 ? ?New rx sent with correct dosage and instructions.   Advised to keep appt for further refills. ?

## 2021-07-17 NOTE — Telephone Encounter (Signed)
Pt c/o medication issue: ? ?1. Name of Medication: hydrochlorothiazide (HYDRODIURIL) 25 MG tablet ? ?2. How are you currently taking this medication (dosage and times per day)?  ? ?3. Are you having a reaction (difficulty breathing--STAT)?  ? ?4. What is your medication issue? Pharmacy was calling requesting clarification of the patient's dosage. They will need a new rx wirtten with the correct instructions  ?

## 2021-07-20 ENCOUNTER — Other Ambulatory Visit: Payer: Self-pay

## 2021-07-31 ENCOUNTER — Other Ambulatory Visit: Payer: Self-pay

## 2021-08-03 ENCOUNTER — Other Ambulatory Visit: Payer: Self-pay

## 2021-08-04 ENCOUNTER — Other Ambulatory Visit: Payer: Self-pay

## 2021-08-17 ENCOUNTER — Other Ambulatory Visit: Payer: Self-pay

## 2021-09-07 ENCOUNTER — Other Ambulatory Visit: Payer: Self-pay

## 2021-09-25 ENCOUNTER — Encounter: Payer: Self-pay | Admitting: Cardiovascular Disease

## 2021-09-25 ENCOUNTER — Ambulatory Visit (INDEPENDENT_AMBULATORY_CARE_PROVIDER_SITE_OTHER): Payer: Medicare Other | Admitting: Cardiovascular Disease

## 2021-09-25 VITALS — BP 142/80 | HR 77 | Ht 66.0 in | Wt 204.0 lb

## 2021-09-25 DIAGNOSIS — E0590 Thyrotoxicosis, unspecified without thyrotoxic crisis or storm: Secondary | ICD-10-CM

## 2021-09-25 DIAGNOSIS — I10 Essential (primary) hypertension: Secondary | ICD-10-CM | POA: Diagnosis not present

## 2021-09-25 DIAGNOSIS — R002 Palpitations: Secondary | ICD-10-CM | POA: Diagnosis not present

## 2021-09-25 DIAGNOSIS — U071 COVID-19: Secondary | ICD-10-CM | POA: Diagnosis not present

## 2021-09-25 DIAGNOSIS — E669 Obesity, unspecified: Secondary | ICD-10-CM | POA: Diagnosis not present

## 2021-09-25 DIAGNOSIS — R7303 Prediabetes: Secondary | ICD-10-CM

## 2021-09-25 DIAGNOSIS — I4510 Unspecified right bundle-branch block: Secondary | ICD-10-CM

## 2021-09-25 DIAGNOSIS — E7800 Pure hypercholesterolemia, unspecified: Secondary | ICD-10-CM

## 2021-09-25 LAB — COMPREHENSIVE METABOLIC PANEL
ALT: 24 IU/L (ref 0–32)
AST: 15 IU/L (ref 0–40)
Albumin/Globulin Ratio: 1.8 (ref 1.2–2.2)
Albumin: 4.3 g/dL (ref 3.8–4.8)
Alkaline Phosphatase: 80 IU/L (ref 44–121)
BUN/Creatinine Ratio: 21 (ref 12–28)
BUN: 15 mg/dL (ref 8–27)
Bilirubin Total: 0.3 mg/dL (ref 0.0–1.2)
CO2: 23 mmol/L (ref 20–29)
Calcium: 9.8 mg/dL (ref 8.7–10.3)
Chloride: 107 mmol/L — ABNORMAL HIGH (ref 96–106)
Creatinine, Ser: 0.7 mg/dL (ref 0.57–1.00)
Globulin, Total: 2.4 g/dL (ref 1.5–4.5)
Glucose: 89 mg/dL (ref 70–99)
Potassium: 4.2 mmol/L (ref 3.5–5.2)
Sodium: 141 mmol/L (ref 134–144)
Total Protein: 6.7 g/dL (ref 6.0–8.5)
eGFR: 94 mL/min/{1.73_m2} (ref >59)

## 2021-09-25 LAB — CBC WITH DIFFERENTIAL/PLATELET
Basophils Absolute: 0 10*3/uL (ref 0.0–0.2)
Basos: 0 %
EOS (ABSOLUTE): 0.1 10*3/uL (ref 0.0–0.4)
Eos: 3 %
Hematocrit: 42.3 % (ref 34.0–46.6)
Hemoglobin: 13.2 g/dL (ref 11.1–15.9)
Immature Grans (Abs): 0 10*3/uL (ref 0.0–0.1)
Immature Granulocytes: 0 %
Lymphocytes Absolute: 1.7 10*3/uL (ref 0.7–3.1)
Lymphs: 38 %
MCH: 23.4 pg — ABNORMAL LOW (ref 26.6–33.0)
MCHC: 31.2 g/dL — ABNORMAL LOW (ref 31.5–35.7)
MCV: 75 fL — ABNORMAL LOW (ref 79–97)
Monocytes Absolute: 0.4 10*3/uL (ref 0.1–0.9)
Monocytes: 10 %
Neutrophils Absolute: 2.2 10*3/uL (ref 1.4–7.0)
Neutrophils: 49 %
Platelets: 250 10*3/uL (ref 150–450)
RBC: 5.64 x10E6/uL — ABNORMAL HIGH (ref 3.77–5.28)
RDW: 16 % — ABNORMAL HIGH (ref 11.7–15.4)
WBC: 4.5 10*3/uL (ref 3.4–10.8)

## 2021-09-25 LAB — LIPID PANEL
Chol/HDL Ratio: 3.2 ratio (ref 0.0–4.4)
Cholesterol, Total: 210 mg/dL — ABNORMAL HIGH (ref 100–199)
HDL: 65 mg/dL (ref >39)
LDL Chol Calc (NIH): 132 mg/dL — ABNORMAL HIGH (ref 0–99)
Triglycerides: 75 mg/dL (ref 0–149)
VLDL Cholesterol Cal: 13 mg/dL (ref 5–40)

## 2021-09-25 LAB — TSH: TSH: 0.956 u[IU]/mL (ref 0.450–4.500)

## 2021-09-25 LAB — HEMOGLOBIN A1C
Est. average glucose Bld gHb Est-mCnc: 123 mg/dL
Hgb A1c MFr Bld: 5.9 % — ABNORMAL HIGH (ref 4.8–5.6)

## 2021-09-25 NOTE — Patient Instructions (Signed)
Medication Instructions:  Continue same medications *If you need a refill on your cardiac medications before your next appointment, please call your pharmacy*   Lab Work: Cmet,cbc,tsh,a1c,lipid panel today   Testing/Procedures: None ordered   Follow-Up: At [facility], you and your health needs are our priority.  As part of our continuing mission to provide you with exceptional heart care, we have created designated Provider Care Teams.  These Care Teams include your primary Cardiologist (physician) and Advanced Practice Providers (APPs -  Physician Assistants and Nurse Practitioners) who all work together to provide you with the care you need, when you need it.  We recommend signing up for the patient portal called "MyChart".  Sign up information is provided on this After Visit Summary.  MyChart is used to connect with patients for Virtual Visits (Telemedicine).  Patients are able to view lab/test results, encounter notes, upcoming appointments, etc.  Non-urgent messages can be sent to your provider as well.   To learn more about what you can do with MyChart, go to [email or web address].    Your next appointment:  1 year    Call in Feb to schedule May appointment    The format for your next appointment: Office   Provider:  [facility]   Important Information About Sugar

## 2021-09-25 NOTE — Progress Notes (Signed)
Cardiology Office Note    Date:  10/04/2021   ID:  Kayla Preston, DOB 05-08-1951, MRN [id number]  PCP:  [name], MD  Cardiologist:  Shelva Majestic, MD   14 month follow-up evaluation  History of Present Illness:  Kayla Preston is a 71 y.o. female  who was referred by [facility] for evaluation of palpitations.  She was evaluated initially on September 05, 2018 in a telemedicine visit. She presents for a 13 month follow-uo evaluation   Kayla Preston  was hospitalized at [facility] in 2010 and underwent an atrial fibrillation ablation. She had done very well with reference to this.  She has a history of hypertension and has been on amlodipine in addition to hydrochlorothiazide.  She also has GI issues which have improved with Linzess.  Recently, she has begun to notice episodic palpitations associated with night sweats.  She denied any associated chest pain.  She had undergone recent laboratory and was found to have an over suppressed thyroid TSH of 0.020.  She was scheduled to see an endocrinologist and  was started on methimazole 5 mg.  She was subsequently evaluated by Dr. Renato Shin.  Subsequent TSH and free T4 were normal and she was advised to continue her same medication.  She also has a history of vitamin D deficiency and was on 50,000 units weekly.    When I initially evaluated her, she was on amlodipine and HCTZ for hypertension.  She was found to have over suppressed thyroid which may be contributing to her arrhythmia and palpitations elected to add Toprol-XL initially at 25 mg.  I recommended that she undergo a 2D echo Doppler study which was done on October 11, 2018 and showed normal LV function with mild LVH and mild diastolic dysfunction.  There is very mild dilation of her aortic root at 39 mm.   When she was evaluated in her follow-up telemedicine evaluation in June 2020 she noted significant benefit since the initiation of Toprol-XL.  She states her blood pressure typically runs in  the 579 systolic range.  She denied any recurrent palpitations,presyncope or syncope or chest pain.  She was sleeping well but often wakes up when neighbors in a nearby apartment seem to smoke in the early morning hours with weed and other compounds  which wakes her up and contributes to congestion.  She was diagnosed with COVID-19 infection and questionable pneumonia.  She was treated with albuterol, Decadron, as well as Zithromax in the emergency room on April 13, 2019.  On December 8 she had an infusion of bamlanivimab.  Following her infusion therapy she felt significant benefit in her symptomatology and shortness of breath.    I saw her in the office in January 2021 at which time she felt well and was without chest pain or palpitations.   She was without recurrent tachypalpitations and tolerating metoprolol XL 25 mg daily.  She continued to be on HCTZ 12.5 mg and denied leg swelling.  She also was on atorvastatin 20 mg for hyperlipidemia.  Her blood pressure was controlled.  I last saw her on August 07, 2020.  She had undergone laboratory in January 2022 and had an normal TSH of 1.5.  Renal function was stable with a creatinine of 0.72.  Fasting glucose was 89 and hemoglobin A1c was mildly increased at 6.1.  Presently she denies any chest pain.  She denies any swelling.  She has noticed that her pulse tends to be on the high side.  She has  not been successful with weight loss.  During that evaluation, with her increased pulse I recommended slight titration of metoprolol succinate to 50 mg daily.  She continued to be on HCTZ 12.5 mg and was without swelling and recommended she decrease this to every other day.  Since I last saw her, she has felt well.  She is unaware of any recurrent palpitations.  She continues to be on metoprolol succinate 50 mg daily and HCTZ 12.5 mg every other day.  She was taken off metformin and also is no longer on thyroid medication.  She is on Trulicity injection weekly.  She  denies palpitations, presyncope or syncope.  There is no chest pain.  She has not had recent laboratory.  She presents for reevaluation.   Past Medical History:  Diagnosis Date   Atrial fibrillation (HCC)    Atrial fibrillation (HCC)    she had ablation which resolved intermittent afib   Hypertension    Obesity    Seasonal allergies     Past Surgical History:  Procedure Laterality Date   ABLATION      Current Medications: Outpatient Medications Prior to Visit  Medication Sig Dispense Refill   albuterol (PROAIR HFA) 108 (90 Base) MCG/ACT inhaler Inhale 2 puffs into the lungs every 6 (six) hours as needed. 8.5 g 4   aspirin EC 81 MG tablet Take 81 mg by mouth daily.     Dulaglutide (TRULICITY) 0.75 MG/0.5ML SOPN Inject 0.75 mg into the skin once a week. 2 mL 6   hydrochlorothiazide (HYDRODIURIL) 25 MG tablet Take 1/2 tablet by mouth every other day. (Keep upcoming appointment for further refills) 30 tablet 0   linaclotide (LINZESS) 145 MCG CAPS capsule TAKE 1 CAPSULE BY MOUTH AT LEAST 30 MINUTES BEFORE THE FIRST MEAL OF THE DAY ON AN EMPTY STOMACH 90 capsule 2   metoprolol succinate (TOPROL-XL) 50 MG 24 hr tablet TAKE 1 TABLET (50 MG TOTAL) BY MOUTH DAILY. 90 tablet 1   atorvastatin (LIPITOR) 40 MG tablet Take 1 tablet (40 mg total) by mouth daily. (Patient not taking: Reported on 09/25/2021) 90 tablet 1   linaclotide (LINZESS) 145 MCG CAPS capsule Take 1 capsule by mouth at least 87m minutes before the first meal of the day on an emply stomach. (Patient not taking: Reported on 09/25/2021) 90 capsule 2   No facility-administered medications prior to visit.     Allergies:   Patient has no known allergies.   Social History   Socioeconomic History   Marital status: Single    Spouse name: Not on file   Number of children: Not on file   Years of education: Not on file   Highest education level: Not on file  Occupational History   Not on file  Tobacco Use   Smoking status: Never    Smokeless tobacco: Never  Substance and Sexual Activity   Alcohol use: No   Drug use: No   Sexual activity: Not Currently  Other Topics Concern   Not on file  Social History Narrative   ** Merged History Encounter **       Social Determinants of Health   Financial Resource Strain: Not on file  Food Insecurity: Not on file  Transportation Needs: Not on file  Physical Activity: Not on file  Stress: Not on file  Social Connections: Not on file     Family History:  The patient's family history includes Heart disease in her father and mother; Hyperlipidemia in her sister.  ROS General: Negative; No fevers, chills, or night sweats;  HEENT: Negative; No changes in vision or hearing, sinus congestion, difficulty swallowing Pulmonary: Negative; No cough, wheezing, shortness of breath, hemoptysis Cardiovascular: Positive for Covid infection with possible pneumonia GI: Negative; No nausea, vomiting, diarrhea, or abdominal pain GU: Negative; No dysuria, hematuria, or difficulty voiding Musculoskeletal: Negative; no myalgias, joint pain, or weakness Hematologic/Oncology: Negative; no easy bruising, bleeding Endocrine: Negative; no heat/cold intolerance; no diabetes Neuro: Negative; no changes in balance, headaches Skin: Negative; No rashes or skin lesions Psychiatric: Negative; No behavioral problems, depression Sleep: Negative; No snoring, daytime sleepiness, hypersomnolence, bruxism, restless legs, hypnogognic hallucinations, no cataplexy Other comprehensive 14 point system review is negative.   PHYSICAL EXAM:   VS:  BP (!) 142/80   Pulse 77   Ht $R'5\' 6"'pN$  (1.676 m)   Wt 204 lb (92.5 kg)   SpO2 98%   BMI 32.93 kg/m    Repeat blood pressure by me was 130/80.  Wt Readings from Last 3 Encounters:  09/25/21 204 lb (92.5 kg)  05/06/21 208 lb 3.2 oz (94.4 kg)  03/24/21 210 lb (95.3 kg)    General: Alert, oriented, no distress.  Skin: normal turgor, no rashes, warm and  dry HEENT: Normocephalic, atraumatic. Pupils equal round and reactive to light; sclera anicteric; extraocular muscles intact;  Nose without nasal septal hypertrophy Mouth/Parynx benign; Mallinpatti scale 3 Neck: No JVD, no carotid bruits; normal carotid upstroke Lungs: clear to ausculatation and percussion; no wheezing or rales Chest wall: without tenderness to palpitation Heart: PMI not displaced, RRR, s1 s2 normal, 1/6 systolic murmur, no diastolic murmur, no rubs, gallops, thrills, or heaves Abdomen: soft, nontender; no hepatosplenomehaly, BS+; abdominal aorta nontender and not dilated by palpation. Back: no CVA tenderness Pulses 2+ Musculoskeletal: full range of motion, normal strength, no joint deformities Extremities: no clubbing cyanosis or edema, Homan's sign negative  Neurologic: grossly nonfocal; Cranial nerves grossly wnl Psychologic: Normal mood and affect   Studies/Labs Reviewed:   May 19,2023ECG (independently read by me): NSR at 77, RBBB   August 07, 2020 ECG (independently read by me): SInus rhytyhm at 94, PVC; RBBB  January 2021 ECG (independently read by me): Normal sinus rhythm at 75 bpm.  Incomplete right bundle branch block.  No ectopy.  Recent Labs:    Latest Ref Rng & Units 09/25/2021    8:45 AM 05/06/2021    9:23 AM 08/13/2020    9:28 PM  BMP  Glucose 70 - 99 mg/dL 89   99   105    BUN 8 - 27 mg/dL $Remove'15   14   11    'VSQBfgZ$ Creatinine 0.57 - 1.00 mg/dL 0.70   0.78   0.93    BUN/Creat Ratio 12 - $Re'28 21   18     'vuq$ Sodium 134 - 144 mmol/L 141   143   136    Potassium 3.5 - 5.2 mmol/L 4.2   3.9   4.1    Chloride 96 - 106 mmol/L 107   109   104    CO2 20 - 29 mmol/L $RemoveB'23   22   22    'uMeAgZTM$ Calcium 8.7 - 10.3 mg/dL 9.8   9.5   9.1          Latest Ref Rng & Units 09/25/2021    8:45 AM 05/06/2021    9:23 AM 10/31/2019   11:25 AM  Hepatic Function  Total Protein 6.0 - 8.5 g/dL 6.7   6.7   7.3  Albumin 3.8 - 4.8 g/dL 4.3   4.3   4.4    AST 0 - 40 IU/L $Remov'15   17   21    'XBNOXt$ ALT 0 -  32 IU/L 24   17   33    Alk Phosphatase 44 - 121 IU/L 80   87   94    Total Bilirubin 0.0 - 1.2 mg/dL 0.3   0.3   0.3         Latest Ref Rng & Units 09/25/2021    8:45 AM 05/06/2021    9:23 AM 08/13/2020    9:28 PM  CBC  WBC 3.4 - 10.8 x10E3/uL 4.5   4.6   6.7    Hemoglobin 11.1 - 15.9 g/dL 13.2   13.3   13.5    Hematocrit 34.0 - 46.6 % 42.3   42.1   42.6    Platelets 150 - 450 x10E3/uL 250   221   208     Lab Results  Component Value Date   MCV 75 (L) 09/25/2021   MCV 74 (L) 05/06/2021   MCV 74.0 (L) 08/13/2020   Lab Results  Component Value Date   TSH 0.956 09/25/2021   Lab Results  Component Value Date   HGBA1C 5.9 (H) 09/25/2021     BNP No results found for: BNP  ProBNP No results found for: PROBNP   Lipid Panel     Component Value Date/Time   CHOL 210 (H) 09/25/2021 0845   TRIG 75 09/25/2021 0845   HDL 65 09/25/2021 0845   CHOLHDL 3.2 09/25/2021 0845   CHOLHDL 3.4 05/17/2016 1004   VLDL 18 05/17/2016 1004   LDLCALC 132 (H) 09/25/2021 0845   LABVLDL 13 09/25/2021 0845     RADIOLOGY: No results found.   Additional studies/ records that were reviewed today include:   ECHO 10/11/2018 IMPRESSIONS  1. The left ventricle has normal systolic function, with an ejection fraction of 55-60%. The cavity size was normal. There is mildly increased left ventricular wall thickness. Left ventricular diastolic Doppler parameters are consistent with impaired  relaxation. No evidence of left ventricular regional wall motion abnormalities.  2. The right ventricle has normal systolic function. The cavity was normal. There is no increase in right ventricular wall thickness.  3. There is mild mitral annular calcification present. Trivial mitral regurgitation.  4. The aortic valve is tricuspid. No stenosis of the aortic valve.  5. There is mild dilatation of the aortic root measuring 39 mm.  6. Normal IVC size. PA systolic pressure 21 mmHg.  ASSESSMENT:    1. Essential  hypertension   2. Palpitations   3. Bundle branch block, right   4. Pure hypercholesterolemia   5. h/o hyperthyroidism   6. Pre-diabetes   7. COVID-19 virus infection: December 2020   8. Mild obesity      PLAN:  1.  Essential hypertension: Blood pressure currently remained stable now on metoprolol succinate 50 mg daily and HCTZ 12.5 mg.  She denies any palpitations.  2.  Palpitations: She denies any recurrent palpitations on her metoprolol dose.  She is no longer on thyroid medication with normalization of TSH  3.  Grade 1 diastolic dysfunction: Noted on echo Doppler study on October 11, 2018.  She had normal LV function.  She denies any dyspnea on exertion.  4.  Right bundle branch block: Stable  5.  Previous hyperthyroidism with over suppression of TSH for which she was started on methimazole.  Most  recent TSH excellent at 1.51  6.  Obesity: BMI today is significantly improved from her last evaluation at three 6.4 and today is 32.9.  She walks.  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  Discussed AHA recommendations of 5 days/week of at least 30 minutes of moderate intensity exercise.  7.  Hyperlipidemia: Laboratory in December 2022 showed total cholesterol 242, triglycerides 58, LDL 167.  Apparently she stopped atorvastatin in February 2023.  We will need to recheck laboratory.  8. COVID-19 infection: December 2020 treated with steroids, Zithromax, and received bamlanivimab infusion therapy with marked resolution of symptoms.  I have recommended laboratory consisting of comprehensive metabolic panel, CBC, TSH, hemoglobin A1c and lipid studies.   I will see her in 1 year for reevaluation.  Medication Adjustments/Labs and Tests Ordered: Current medicines are reviewed at length with the patient today.  Concerns regarding medicines are outlined above.  Medication changes, Labs and Tests ordered today are listed in the Patient Instructions  below. Patient Instructions  Medication Instructions:  Continue same medications *If you need a refill on your cardiac medications before your next appointment, please call your pharmacy*   Lab Work: Cmet,cbc,tsh,a1c,lipid panel today   Testing/Procedures: None ordered   Follow-Up: At [facility], you and your health needs are our priority.  As part of our continuing mission to provide you with exceptional heart care, we have created designated Provider Care Teams.  These Care Teams include your primary Cardiologist (physician) and Advanced Practice Providers (APPs -  Physician Assistants and Nurse Practitioners) who all work together to provide you with the care you need, when you need it.  We recommend signing up for the patient portal called "MyChart".  Sign up information is provided on this After Visit Summary.  MyChart is used to connect with patients for Virtual Visits (Telemedicine).  Patients are able to view lab/test results, encounter notes, upcoming appointments, etc.  Non-urgent messages can be sent to your provider as well.   To learn more about what you can do with MyChart, go to [email or web address].    Your next appointment:  1 year    Call in Feb to schedule May appointment    The format for your next appointment: Office   Provider:  [facility]   Important Information About Sugar         Signed, Shelva Majestic, MD  10/04/2021 8:45 AM    [facility] [address], [place], [place], [place]  [address] Phone: [phone]

## 2021-10-04 ENCOUNTER — Encounter: Payer: Self-pay | Admitting: Cardiovascular Disease

## 2021-10-06 ENCOUNTER — Other Ambulatory Visit: Payer: Self-pay

## 2021-10-06 DIAGNOSIS — H1132 Conjunctival hemorrhage, left eye: Secondary | ICD-10-CM | POA: Diagnosis not present

## 2021-10-07 ENCOUNTER — Other Ambulatory Visit: Payer: Self-pay | Admitting: Cardiovascular Disease

## 2021-10-07 ENCOUNTER — Other Ambulatory Visit: Payer: Self-pay

## 2021-10-07 DIAGNOSIS — I10 Essential (primary) hypertension: Secondary | ICD-10-CM

## 2021-10-07 MED ORDER — METOPROLOL SUCCINATE ER 50 MG PO TB24
ORAL_TABLET | Freq: Every day | ORAL | 3 refills | Status: DC
  Filled 2021-10-07 – 2021-10-19 (×2): qty 90, 90d supply, fill #0
  Filled 2022-01-19: qty 90, 90d supply, fill #1
  Filled 2022-04-22: qty 90, 90d supply, fill #0
  Filled 2022-07-16: qty 90, 90d supply, fill #1

## 2021-10-07 MED ORDER — HYDROCHLOROTHIAZIDE 25 MG PO TABS
12.5000 mg | ORAL_TABLET | ORAL | 3 refills | Status: DC
  Filled 2021-10-07: qty 15, 60d supply, fill #0
  Filled 2021-10-19: qty 24, 90d supply, fill #0
  Filled 2022-01-19: qty 24, 90d supply, fill #1
  Filled 2022-04-12 – 2022-04-14 (×2): qty 24, 90d supply, fill #2
  Filled 2022-07-16: qty 24, 90d supply, fill #3

## 2021-10-19 ENCOUNTER — Other Ambulatory Visit: Payer: Self-pay

## 2021-10-21 ENCOUNTER — Other Ambulatory Visit: Payer: Self-pay

## 2021-10-22 ENCOUNTER — Other Ambulatory Visit: Payer: Self-pay | Admitting: Family Medicine

## 2021-10-22 DIAGNOSIS — Z1231 Encounter for screening mammogram for malignant neoplasm of breast: Secondary | ICD-10-CM

## 2021-11-04 ENCOUNTER — Encounter: Payer: Self-pay | Admitting: Family Medicine

## 2021-11-04 ENCOUNTER — Ambulatory Visit: Payer: Medicare Other | Attending: Family Medicine | Admitting: Family Medicine

## 2021-11-04 ENCOUNTER — Other Ambulatory Visit: Payer: Self-pay

## 2021-11-04 VITALS — BP 142/75 | HR 76 | Temp 98.2°F | Ht 66.0 in | Wt 205.8 lb

## 2021-11-04 DIAGNOSIS — R252 Cramp and spasm: Secondary | ICD-10-CM | POA: Diagnosis not present

## 2021-11-04 DIAGNOSIS — E669 Obesity, unspecified: Secondary | ICD-10-CM | POA: Insufficient documentation

## 2021-11-04 DIAGNOSIS — Z79899 Other long term (current) drug therapy: Secondary | ICD-10-CM | POA: Insufficient documentation

## 2021-11-04 DIAGNOSIS — Z7182 Exercise counseling: Secondary | ICD-10-CM | POA: Diagnosis not present

## 2021-11-04 DIAGNOSIS — Z8616 Personal history of COVID-19: Secondary | ICD-10-CM | POA: Diagnosis not present

## 2021-11-04 DIAGNOSIS — Z6833 Body mass index (BMI) 33.0-33.9, adult: Secondary | ICD-10-CM | POA: Insufficient documentation

## 2021-11-04 DIAGNOSIS — R109 Unspecified abdominal pain: Secondary | ICD-10-CM | POA: Insufficient documentation

## 2021-11-04 DIAGNOSIS — E7800 Pure hypercholesterolemia, unspecified: Secondary | ICD-10-CM | POA: Insufficient documentation

## 2021-11-04 DIAGNOSIS — R7303 Prediabetes: Secondary | ICD-10-CM | POA: Insufficient documentation

## 2021-11-04 DIAGNOSIS — Z8249 Family history of ischemic heart disease and other diseases of the circulatory system: Secondary | ICD-10-CM | POA: Diagnosis not present

## 2021-11-04 DIAGNOSIS — T50995A Adverse effect of other drugs, medicaments and biological substances, initial encounter: Secondary | ICD-10-CM | POA: Diagnosis not present

## 2021-11-04 DIAGNOSIS — Z7985 Long-term (current) use of injectable non-insulin antidiabetic drugs: Secondary | ICD-10-CM | POA: Diagnosis not present

## 2021-11-04 DIAGNOSIS — Z713 Dietary counseling and surveillance: Secondary | ICD-10-CM | POA: Insufficient documentation

## 2021-11-04 DIAGNOSIS — Z8349 Family history of other endocrine, nutritional and metabolic diseases: Secondary | ICD-10-CM | POA: Diagnosis not present

## 2021-11-04 DIAGNOSIS — E0590 Thyrotoxicosis, unspecified without thyrotoxic crisis or storm: Secondary | ICD-10-CM | POA: Diagnosis not present

## 2021-11-04 DIAGNOSIS — I10 Essential (primary) hypertension: Secondary | ICD-10-CM | POA: Diagnosis not present

## 2021-11-04 DIAGNOSIS — Z8679 Personal history of other diseases of the circulatory system: Secondary | ICD-10-CM | POA: Insufficient documentation

## 2021-11-04 DIAGNOSIS — E039 Hypothyroidism, unspecified: Secondary | ICD-10-CM | POA: Insufficient documentation

## 2021-11-04 DIAGNOSIS — E785 Hyperlipidemia, unspecified: Secondary | ICD-10-CM | POA: Insufficient documentation

## 2021-11-04 MED ORDER — EZETIMIBE 10 MG PO TABS
10.0000 mg | ORAL_TABLET | Freq: Every day | ORAL | 6 refills | Status: DC
  Filled 2021-11-04: qty 30, 30d supply, fill #0
  Filled 2021-12-03: qty 30, 30d supply, fill #1
  Filled 2021-12-28 – 2022-01-06 (×2): qty 30, 30d supply, fill #2
  Filled 2022-02-09: qty 30, 30d supply, fill #3
  Filled 2022-03-15: qty 30, 30d supply, fill #4
  Filled 2022-04-12 – 2022-04-14 (×2): qty 30, 30d supply, fill #5
  Filled 2022-05-14: qty 30, 30d supply, fill #6

## 2021-11-04 NOTE — Patient Instructions (Signed)

## 2021-11-04 NOTE — Progress Notes (Signed)
Lower back pain Discuss trulicity

## 2021-11-04 NOTE — Progress Notes (Signed)
Subjective:  Patient ID: Kayla Preston, female    DOB: 1950/06/18  Age: 71 y.o. MRN: [id number]  CC: Hypertension   HPI Kayla Preston is a 71 y.o. year old female with a history of prediabetes (A1c 5.9), hypertension, hyperthyroidism (not on medication) previous history of SVT, history of COVID-19 in 03/2019 (status post monoclonal antibody infusion) seen for a follow up visit.   Interval History: At her last visit she was placed on Trulicity for prediabetes and to also help with weight loss. She states Trulicity is causing her to have abdominal pain and she would like to come off it.   Her HCTZ was changed to every other day by her cardiologist due to cramps in her legs and this has brought about improvement. Cardiology visit occurred last month and notes reviewed. Her labs revealed elevated cholesterol and she discontinued atorvastatin due to the fact that she stated that it caused palpitations which resolved after atorvastatin was discontinued. For hypothyroidism she is currently not on medications. Denies additional concerns today. She returned to work part time. Past Medical History:  Diagnosis Date   Atrial fibrillation (HCC)    Atrial fibrillation (HCC)    she had ablation which resolved intermittent afib   Hypertension    Obesity    Seasonal allergies     Past Surgical History:  Procedure Laterality Date   ABLATION      Family History  Problem Relation Age of Onset   Heart disease Mother    Heart disease Father    Hyperlipidemia Sister    Thyroid disease Neg Hx     Social History   Socioeconomic History   Marital status: Single    Spouse name: Not on file   Number of children: Not on file   Years of education: Not on file   Highest education level: Not on file  Occupational History   Not on file  Tobacco Use   Smoking status: Never   Smokeless tobacco: Never  Substance and Sexual Activity   Alcohol use: No   Drug use: No   Sexual activity: Not  Currently  Other Topics Concern   Not on file  Social History Narrative   ** Merged History Encounter **       Social Determinants of Health   Financial Resource Strain: Not on file  Food Insecurity: Not on file  Transportation Needs: Not on file  Physical Activity: Not on file  Stress: Not on file  Social Connections: Not on file    No Known Allergies  Outpatient Medications Prior to Visit  Medication Sig Dispense Refill   albuterol (PROAIR HFA) 108 (90 Base) MCG/ACT inhaler Inhale 2 puffs into the lungs every 6 (six) hours as needed. 8.5 g 4   aspirin EC 81 MG tablet Take 81 mg by mouth daily.     Dulaglutide (TRULICITY) 0.75 MG/0.5ML SOPN Inject 0.75 mg into the skin once a week. 2 mL 6   hydrochlorothiazide (HYDRODIURIL) 25 MG tablet Take 1/2 tablet by mouth every other day. (Keep upcoming appointment for further refills) 45 tablet 3   metoprolol succinate (TOPROL-XL) 50 MG 24 hr tablet TAKE 1 TABLET (50 MG TOTAL) BY MOUTH ONCE DAILY. 90 tablet 3   linaclotide (LINZESS) 145 MCG CAPS capsule TAKE 1 CAPSULE BY MOUTH AT LEAST 30 MINUTES BEFORE THE FIRST MEAL OF THE DAY ON AN EMPTY STOMACH 90 capsule 2   No facility-administered medications prior to visit.     ROS Review of Systems  Constitutional:  Negative for activity change, appetite change and fatigue.  HENT:  Negative for congestion, sinus pressure and sore throat.   Eyes:  Negative for visual disturbance.  Respiratory:  Negative for cough, chest tightness, shortness of breath and wheezing.   Cardiovascular:  Negative for chest pain and palpitations.  Gastrointestinal:  Negative for abdominal distention, abdominal pain and constipation.  Endocrine: Negative for polydipsia.  Genitourinary:  Negative for dysuria and frequency.  Musculoskeletal:  Negative for arthralgias and back pain.  Skin:  Negative for rash.  Neurological:  Negative for tremors, light-headedness and numbness.  Hematological:  Does not bruise/bleed  easily.  Psychiatric/Behavioral:  Negative for agitation and behavioral problems.     Objective:  BP (!) 142/75   Pulse 76   Temp 98.2 F (36.8 C) (Oral)   Ht 5\' 6"  (1.676 m)   Wt 205 lb 12.8 oz (93.4 kg)   SpO2 98%   BMI 33.22 kg/m      11/04/2021    8:39 AM 09/25/2021    7:46 AM 05/06/2021    8:37 AM  BP/Weight  Systolic BP 142 142 145  Diastolic BP 75 80 88  Wt. (Lbs) 205.8 204 208.2  BMI 33.22 kg/m2 32.93 kg/m2 33.6 kg/m2      Physical Exam Constitutional:      Appearance: She is well-developed.  Cardiovascular:     Rate and Rhythm: Normal rate.     Heart sounds: Normal heart sounds. No murmur heard. Pulmonary:     Effort: Pulmonary effort is normal.     Breath sounds: Normal breath sounds. No wheezing or rales.  Chest:     Chest wall: No tenderness.  Abdominal:     General: Bowel sounds are normal. There is no distension.     Palpations: Abdomen is soft. There is no mass.     Tenderness: There is no abdominal tenderness.  Musculoskeletal:        General: Normal range of motion.     Right lower leg: No edema.     Left lower leg: No edema.  Neurological:     Mental Status: She is alert and oriented to person, place, and time.  Psychiatric:        Mood and Affect: Mood normal.        Latest Ref Rng & Units 09/25/2021    8:45 AM 05/06/2021    9:23 AM 08/13/2020    9:28 PM  CMP  Glucose 70 - 99 mg/dL 89  99  10/13/2020   BUN 8 - 27 mg/dL 15  14  11    Creatinine 0.57 - 1.00 mg/dL 672   0.94   Sodium 134 - 144 mmol/L 141  143  136   Potassium 3.5 - 5.2 mmol/L 4.2  3.9  4.1   Chloride 96 - 106 mmol/L 107  109  104   CO2 20 - 29 mmol/L 23  22  22    Calcium 8.7 - 10.3 mg/dL 9.8  9.5  9.1   Total Protein 6.0 - 8.5 g/dL 6.7  6.7    Total Bilirubin 0.0 - 1.2 mg/dL 0.3  0.3    Alkaline Phos 44 - 121 IU/L 80  87    AST 0 - 40 IU/L 15  17    ALT 0 - 32 IU/L 24  17      Lipid Panel     Component Value Date/Time   CHOL 210 (H) 09/25/2021 0845   TRIG 75  09/25/2021 0845  HDL 65 09/25/2021 0845   CHOLHDL 3.2 09/25/2021 0845   CHOLHDL 3.4 05/17/2016 1004   VLDL 18 05/17/2016 1004   LDLCALC 132 (H) 09/25/2021 0845    CBC    Component Value Date/Time   WBC 4.5 09/25/2021 0845   WBC 6.7 08/13/2020 2128   RBC 5.64 (H) 09/25/2021 0845   RBC 5.76 (H) 08/13/2020 2128   HGB 13.2 09/25/2021 0845   HCT 42.3 09/25/2021 0845   PLT 250 09/25/2021 0845   MCV 75 (L) 09/25/2021 0845   MCH 23.4 (L) 09/25/2021 0845   MCH 23.4 (L) 08/13/2020 2128   MCHC 31.2 (L) 09/25/2021 0845   MCHC 31.7 08/13/2020 2128   RDW 16.0 (H) 09/25/2021 0845   LYMPHSABS 1.7 09/25/2021 0845   MONOABS 0.8 08/13/2020 2128   EOSABS 0.1 09/25/2021 0845   BASOSABS 0.0 09/25/2021 0845    Lab Results  Component Value Date   HGBA1C 5.9 (H) 09/25/2021    Lab Results  Component Value Date   TSH 0.956 09/25/2021    Assessment & Plan:  1. Essential hypertension Slightly above goal today She takes HCTZ every other day to prevent muscle cramps Continue metoprolol Counseled on blood pressure goal of less than 130/80, low-sodium, DASH diet, medication compliance, 150 minutes of moderate intensity exercise per week. Discussed medication compliance, adverse effects.  2. Prediabetes Labs reveal prediabetes with an A1c of 5.9.  Working on a low carbohydrate diet, exercise, weight loss is recommended in order to prevent progression to type 2 diabetes mellitus. We will discontinue Ozempic due to intolerance  3. Hyperthyroidism Last TSH was normal Controlled off medications  4. Pure hypercholesterolemia Uncontrolled She is unable to tolerate atorvastatin She would be an ideal candidate for PCSK9 inhibitor however she is not open to that We will place on Zetia Low-cholesterol diet   Meds ordered this encounter  Medications   ezetimibe (ZETIA) 10 MG tablet    Sig: Take 1 tablet (10 mg total) by mouth daily.    Dispense:  30 tablet    Refill:  6    Follow-up:  Return in about 6 months (around 05/06/2022) for Chronic medical conditions.       [name], MD, FAAFP. [facility], [place]   11/04/2021, 9:12 AM

## 2021-11-09 ENCOUNTER — Other Ambulatory Visit: Payer: Self-pay

## 2021-11-23 ENCOUNTER — Ambulatory Visit: Payer: Medicare Other

## 2021-12-03 ENCOUNTER — Other Ambulatory Visit: Payer: Self-pay

## 2021-12-04 ENCOUNTER — Ambulatory Visit: Payer: Medicare Other

## 2021-12-15 DIAGNOSIS — R7309 Other abnormal glucose: Secondary | ICD-10-CM | POA: Diagnosis not present

## 2021-12-15 DIAGNOSIS — H2511 Age-related nuclear cataract, right eye: Secondary | ICD-10-CM | POA: Diagnosis not present

## 2021-12-15 DIAGNOSIS — Z961 Presence of intraocular lens: Secondary | ICD-10-CM | POA: Diagnosis not present

## 2021-12-15 DIAGNOSIS — H26492 Other secondary cataract, left eye: Secondary | ICD-10-CM | POA: Diagnosis not present

## 2021-12-25 ENCOUNTER — Other Ambulatory Visit: Payer: Self-pay | Admitting: Family Medicine

## 2021-12-25 ENCOUNTER — Other Ambulatory Visit: Payer: Self-pay

## 2021-12-25 MED ORDER — LINACLOTIDE 145 MCG PO CAPS
ORAL_CAPSULE | ORAL | 1 refills | Status: DC
  Filled 2021-12-25: qty 90, 90d supply, fill #0
  Filled 2022-03-25: qty 90, 90d supply, fill #1

## 2021-12-28 ENCOUNTER — Other Ambulatory Visit: Payer: Self-pay

## 2021-12-29 ENCOUNTER — Other Ambulatory Visit: Payer: Self-pay

## 2021-12-31 ENCOUNTER — Ambulatory Visit
Admission: RE | Admit: 2021-12-31 | Discharge: 2021-12-31 | Disposition: A | Discharge status: Home / Self Care | Payer: Medicare Other | Source: Ambulatory Visit | Attending: Family Medicine | Admitting: Family Medicine

## 2021-12-31 DIAGNOSIS — Z1231 Encounter for screening mammogram for malignant neoplasm of breast: Secondary | ICD-10-CM

## 2022-01-06 ENCOUNTER — Other Ambulatory Visit: Payer: Self-pay

## 2022-01-06 DIAGNOSIS — Z23 Encounter for immunization: Secondary | ICD-10-CM | POA: Diagnosis not present

## 2022-01-08 ENCOUNTER — Other Ambulatory Visit: Payer: Self-pay

## 2022-01-11 IMAGING — CR DG CHEST 2V
2 series · 2 of 2 positions shown · non-contrast
Comparison: 04/13/2019

CLINICAL DATA: Productive cough, RIGHT-side chest pain, shortness
of breath with chest pain and headache since [REDACTED], history
hypertension, atrial fibrillation

EXAM:
CHEST - 2 VIEW

[chest pa]
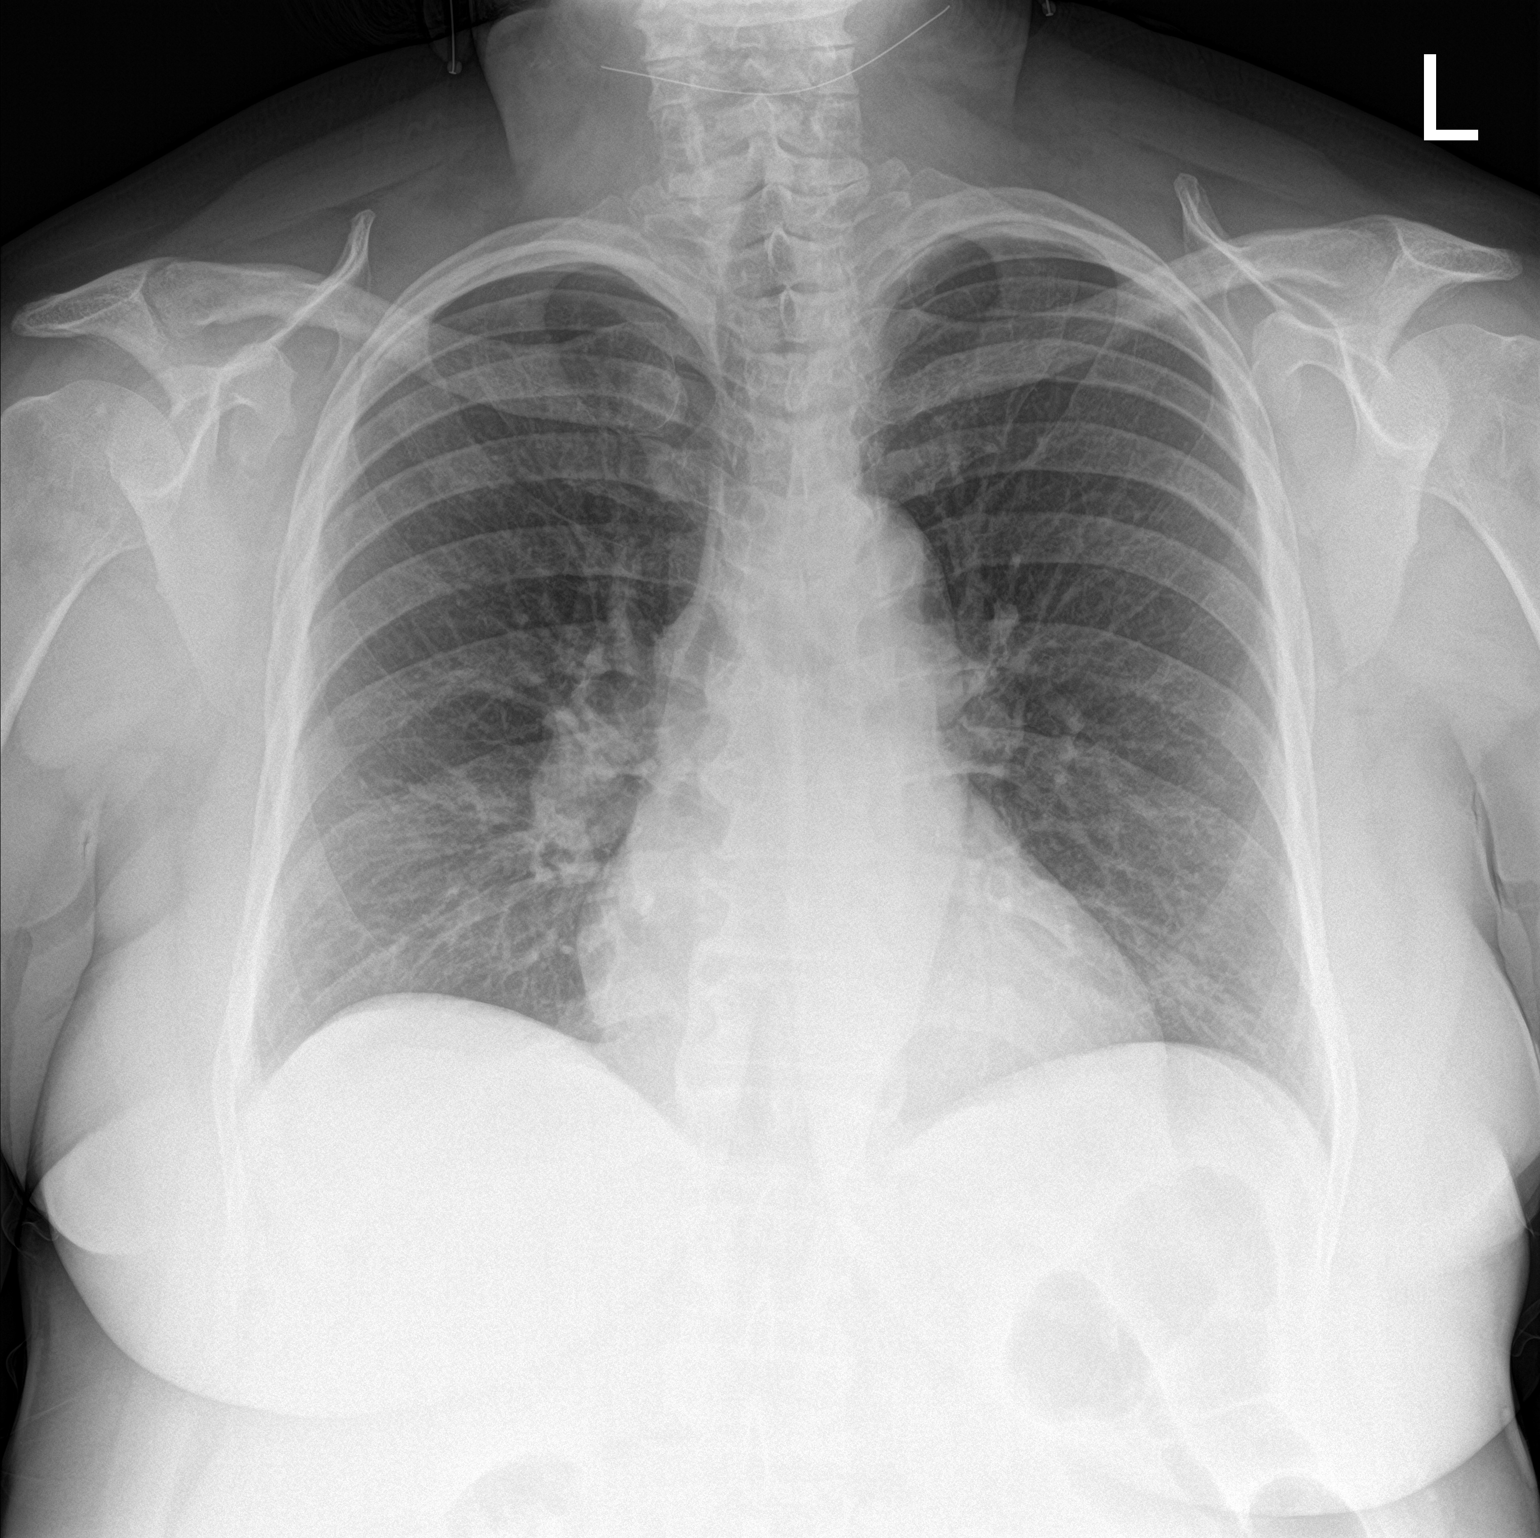

[chest lat]
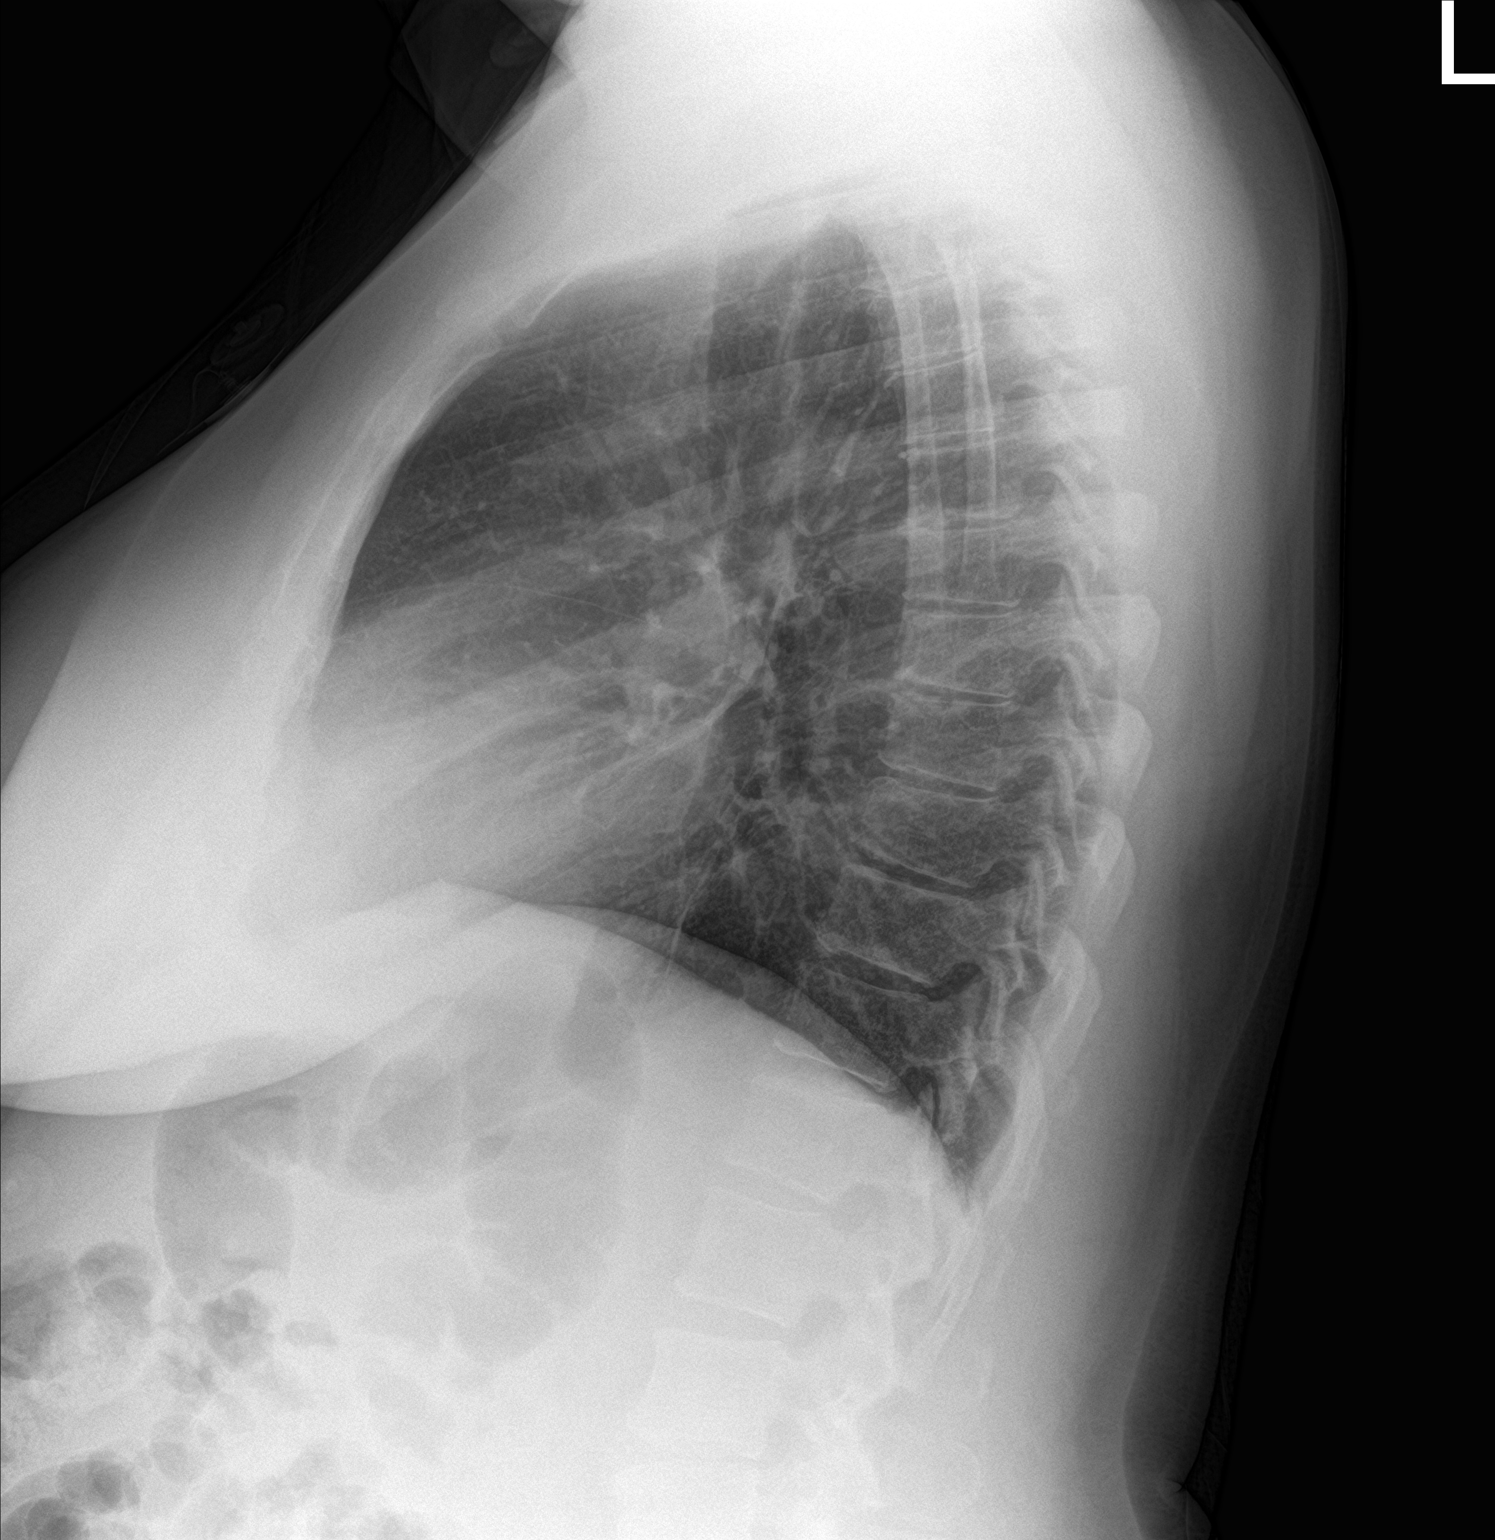

[2 of 2 positions shown; findings below may reference images not displayed]

FINDINGS: Normal heart size and pulmonary vascularity.

Enlargement of the RIGHT hilum, question adenopathy versus mass.

Lungs clear.

No pleural effusion or pneumothorax.

Atherosclerotic calcification aorta noted.

No acute osseous findings.
IMPRESSION: Enlargement of the RIGHT pulmonary hilum question mass versus
adenopathy; CT chest with contrast recommended for further
assessment.

## 2022-01-19 ENCOUNTER — Other Ambulatory Visit: Payer: Self-pay

## 2022-01-21 ENCOUNTER — Encounter: Payer: Self-pay | Admitting: Family Medicine

## 2022-02-09 ENCOUNTER — Other Ambulatory Visit: Payer: Self-pay

## 2022-02-09 ENCOUNTER — Other Ambulatory Visit (HOSPITAL_COMMUNITY): Payer: Self-pay

## 2022-02-11 ENCOUNTER — Other Ambulatory Visit: Payer: Self-pay

## 2022-03-15 ENCOUNTER — Other Ambulatory Visit: Payer: Self-pay

## 2022-03-16 ENCOUNTER — Other Ambulatory Visit: Payer: Self-pay

## 2022-03-17 ENCOUNTER — Other Ambulatory Visit: Payer: Self-pay

## 2022-03-24 ENCOUNTER — Other Ambulatory Visit: Payer: Self-pay

## 2022-03-24 ENCOUNTER — Other Ambulatory Visit: Payer: Self-pay | Admitting: Family Medicine

## 2022-03-24 MED ORDER — ALBUTEROL SULFATE HFA 108 (90 BASE) MCG/ACT IN AERS
2.0000 | INHALATION_SPRAY | Freq: Four times a day (QID) | RESPIRATORY_TRACT | 1 refills | Status: DC | PRN
  Filled 2022-03-24: qty 6.7, 25d supply, fill #0
  Filled 2022-05-14: qty 6.7, 25d supply, fill #1

## 2022-03-25 ENCOUNTER — Other Ambulatory Visit: Payer: Self-pay

## 2022-03-26 ENCOUNTER — Other Ambulatory Visit: Payer: Self-pay

## 2022-03-29 ENCOUNTER — Other Ambulatory Visit: Payer: Self-pay

## 2022-04-12 ENCOUNTER — Other Ambulatory Visit: Payer: Self-pay

## 2022-04-14 ENCOUNTER — Other Ambulatory Visit: Payer: Self-pay

## 2022-04-14 ENCOUNTER — Other Ambulatory Visit (HOSPITAL_COMMUNITY): Payer: Self-pay

## 2022-04-15 ENCOUNTER — Other Ambulatory Visit: Payer: Self-pay

## 2022-04-15 ENCOUNTER — Emergency Department (HOSPITAL_COMMUNITY)
Admission: EM | Admit: 2022-04-15 | Discharge: 2022-04-15 | Disposition: A | Discharge status: Home / Self Care | Payer: Medicare Other | Attending: Emergency Medicine | Admitting: Emergency Medicine

## 2022-04-15 ENCOUNTER — Emergency Department (HOSPITAL_COMMUNITY): Payer: Medicare Other

## 2022-04-15 ENCOUNTER — Encounter (HOSPITAL_COMMUNITY): Payer: Self-pay

## 2022-04-15 DIAGNOSIS — J4540 Moderate persistent asthma, uncomplicated: Secondary | ICD-10-CM | POA: Diagnosis not present

## 2022-04-15 DIAGNOSIS — I10 Essential (primary) hypertension: Secondary | ICD-10-CM | POA: Insufficient documentation

## 2022-04-15 DIAGNOSIS — R059 Cough, unspecified: Secondary | ICD-10-CM | POA: Diagnosis not present

## 2022-04-15 DIAGNOSIS — Z79899 Other long term (current) drug therapy: Secondary | ICD-10-CM | POA: Insufficient documentation

## 2022-04-15 DIAGNOSIS — J189 Pneumonia, unspecified organism: Secondary | ICD-10-CM | POA: Diagnosis not present

## 2022-04-15 DIAGNOSIS — Z7982 Long term (current) use of aspirin: Secondary | ICD-10-CM | POA: Diagnosis not present

## 2022-04-15 MED ORDER — ACETAMINOPHEN 500 MG PO TABS: 1000.0000 mg | ORAL_TABLET | Freq: Once | ORAL | Status: DC

## 2022-04-15 MED ORDER — AZITHROMYCIN 250 MG PO TABS
250.0000 mg | ORAL_TABLET | Freq: Every day | ORAL | 0 refills | Status: DC
  Filled 2022-04-15: qty 6, 5d supply, fill #0

## 2022-04-15 MED ORDER — AMOXICILLIN-POT CLAVULANATE 875-125 MG PO TABS
1.0000 | ORAL_TABLET | Freq: Two times a day (BID) | ORAL | 0 refills | Status: DC
  Filled 2022-04-15: qty 14, 7d supply, fill #0

## 2022-04-15 MED ORDER — ACETAMINOPHEN 325 MG PO TABS
650.0000 mg | ORAL_TABLET | Freq: Once | ORAL | Status: AC
  Administered 2022-04-15: 650 mg via ORAL
  Filled 2022-04-15: qty 2

## 2022-04-15 NOTE — ED Provider Notes (Signed)
[facility] EMERGENCY DEPARTMENT Provider Note  CSN: [id number] Arrival date & time: 04/15/22 [id number]  Chief Complaint(s) Cough  HPI Kayla Preston is a 71 y.o. female with history of obesity, hypertension presenting with cough.  Patient reports 2 weeks of cough.  She reports intermittent fevers and chills.  The cough is productive.  No weight loss.  She reports mild congestion and headache.  No nausea or vomiting, hemoptysis, shortness of breath, lightheadedness or dizziness, fainting.  Symptoms mild.  Otherwise feels well when not having fever.   Past Medical History Past Medical History:  Diagnosis Date   Atrial fibrillation [facility])    Atrial fibrillation [facility])    she had ablation which resolved intermittent afib   Hypertension    Obesity    Seasonal allergies    Patient Active Problem List   Diagnosis Date Noted   Hyperlipidemia 11/04/2021   Trigger finger, right ring finger 03/24/2021   Lab test positive for detection of COVID-19 virus 04/14/2019   Hyperthyroidism 09/13/2018   Viral URI with cough 03/13/2018   Moderate persistent asthma without complication [id number]   Seasonal allergies 09/13/2017   Essential hypertension 06/15/2017   Cervical polyp 06/16/2016   Anemia, iron deficiency 07/22/2015   Awareness of heartbeats 07/05/2011   Bundle branch block, 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 Authorizing Provider  amoxicillin-clavulanate (AUGMENTIN) 875-125 MG tablet Take 1 tablet by mouth every 12 (twelve) hours. 04/15/22  Yes Cristie Hem, MD  azithromycin (ZITHROMAX) 250 MG tablet Take 1 tablet (250 mg total) by mouth daily. Take first 2 tablets together, then 1 every day until finished. 04/15/22  Yes Cristie Hem, MD  albuterol (PROAIR HFA) 108 (90 Base) MCG/ACT inhaler Inhale 2 puffs into the lungs every 6 (six) hours as  needed. 03/24/22   [name], MD  aspirin EC 81 MG tablet Take 81 mg by mouth daily.    [provider]  Dulaglutide (TRULICITY) [id number] [id number] SOPN Inject 0.75 mg into the skin once a week. 05/06/21   [name], MD  ezetimibe (ZETIA) 10 MG tablet Take 1 tablet (10 mg total) by mouth daily. 11/04/21   [name], MD  hydrochlorothiazide (HYDRODIURIL) 25 MG tablet Take 1/2 tablet by mouth every other day. (Keep upcoming appointment for further refills) 10/07/21   Troy Sine, MD  linaclotide (LINZESS) 145 MCG CAPS capsule TAKE 1 CAPSULE BY MOUTH AT LEAST 30 MINUTES BEFORE THE FIRST MEAL OF THE DAY ON AN EMPTY STOMACH 07/10/20 07/10/21  Otis Brace, MD  linaclotide (LINZESS) 145 MCG CAPS capsule TAKE 1 CAPSULE BY MOUTH AT LEAST 30 MINUTES BEFORE THE FIRST MEAL OF THE DAY ON AN EMPTY STOMACH (must make office visit for further refills 12/25/21     metoprolol succinate (TOPROL-XL) 50 MG 24 hr tablet TAKE 1 TABLET (50 MG TOTAL) BY MOUTH ONCE DAILY. 10/07/21   Troy Sine, MD  Past Surgical History Past Surgical History:  Procedure Laterality Date   ABLATION     Family History Family History  Problem Relation Age of Onset   Heart disease Mother    Heart disease Father    Hyperlipidemia Sister    Thyroid disease Neg Hx     Social History Social History   Tobacco Use   Smoking status: Never   Smokeless tobacco: Never  Substance Use Topics   Alcohol use: No   Drug use: No   Allergies Patient has no known allergies.  Review of Systems Review of Systems  All other systems reviewed and are negative.   Physical Exam Vital Signs  I have reviewed the triage vital signs BP 127/72   Pulse 92   Temp 100.2 F (37.9 C) (Oral)   Resp 19   SpO2 94%  Physical Exam Vitals and nursing note reviewed.  Constitutional:      General:  She is not in acute distress.    Appearance: She is well-developed.  HENT:     Head: Normocephalic and atraumatic.     Mouth/Throat:     Mouth: Mucous membranes are moist.  Eyes:     Pupils: Pupils are equal, round, and reactive to light.  Cardiovascular:     Rate and Rhythm: Normal rate.  Pulmonary:     Effort: Pulmonary effort is normal. No respiratory distress.     Breath sounds: Rhonchi (right lower lobe) present.  Abdominal:     General: Abdomen is flat.     Palpations: Abdomen is soft.     Tenderness: There is no abdominal tenderness.  Musculoskeletal:        General: No tenderness.     Right lower leg: No edema.     Left lower leg: No edema.  Skin:    General: Skin is warm and dry.  Neurological:     General: No focal deficit present.     Mental Status: She is alert. Mental status is at baseline.  Psychiatric:        Mood and Affect: Mood normal.        Behavior: Behavior normal.     ED Results and Treatments Labs (all labs ordered are listed, but only abnormal results are displayed) Labs Reviewed - No data to display                                                                                                                        Radiology DG Chest 2 View  Result Date: 04/15/2022 CLINICAL DATA:  Persistent productive cough EXAM: CHEST - 2 VIEW COMPARISON:  08/13/2020 FINDINGS: Cardiac size is within normal limits. There is new moderate sized patchy alveolar infiltrate in right lower lung field. Rest of the lung fields are clear. There is no pleural effusion or pneumothorax. IMPRESSION: New patchy alveolar infiltrates are seen in right lower lung field consistent with pneumonia. Electronically Signed   By: Ernie Avena M.D.   On: 04/15/2022 07:56  Pertinent labs & imaging results that were available during my care of the patient were reviewed by me and considered in my medical decision making (see MDM for details).  Medications Ordered in  ED Medications  acetaminophen (TYLENOL) tablet 650 mg (650 mg Oral Given 04/15/22 1011)                                                                                                                                     Procedures Procedures  (including critical care time)  Medical Decision Making / ED Course   MDM:  71 year old female presenting to the emergency department with cough.  Patient well-appearing, exam with some right lower lobe rhonchi.  X-ray also with right lower lobe infiltrate concerning for pneumonia.  Will treat for community-acquired pneumonia.  Patient clinically well-appearing, no hypotension, tachycardia to suggest sepsis.  Patient does have borderline fever in the emergency department, received Tylenol.  Patient has close outpatient follow-up with her primary doctor in approximately 1 week.  Given age discussed strict return precautions for any worsening symptoms, weakness, lethargy, rapid heart beat, or any other concerning symptoms. Will discharge patient to home. All questions answered. Patient comfortable with plan of discharge. Return precautions discussed with patient and specified on the after visit summary.       Imaging Studies ordered: I ordered imaging studies including CXR On my interpretation imaging demonstrates RLL pna I independently visualized and interpreted imaging. I agree with the radiologist interpretation   Medicines ordered and prescription drug management: Meds ordered this encounter  Medications   acetaminophen (TYLENOL) tablet 650 mg   amoxicillin-clavulanate (AUGMENTIN) 875-125 MG tablet    Sig: Take 1 tablet by mouth every 12 (twelve) hours.    Dispense:  14 tablet    Refill:  0   azithromycin (ZITHROMAX) 250 MG tablet    Sig: Take 1 tablet (250 mg total) by mouth daily. Take first 2 tablets together, then 1 every day until finished.    Dispense:  6 tablet    Refill:  0   DISCONTD: acetaminophen (TYLENOL) tablet 1,000 mg     -I have reviewed the patients home medicines and have made adjustments as needed  Social Determinants of Health:  Diagnosis or treatment significantly limited by social determinants of health: obesity   Reevaluation: After the interventions noted above, I reevaluated the patient and found that they have improved  Co morbidities that complicate the patient evaluation  Past Medical History:  Diagnosis Date   Atrial fibrillation ([place])    Atrial fibrillation ([place])    she had ablation which resolved intermittent afib   Hypertension    Obesity    Seasonal allergies       Dispostion: Disposition decision including need for hospitalization was considered, and patient discharged from emergency department.    Final Clinical Impression(s) / ED Diagnoses Final diagnoses:  Community acquired pneumonia, unspecified laterality     This  chart was dictated using voice recognition software.  Despite best efforts to proofread,  errors can occur which can change the documentation meaning.    Cristie Hem, MD 04/15/22 1125

## 2022-04-15 NOTE — Discharge Instructions (Addendum)
We have diagnosed you with a pneumonia.  Please take your antibiotics as prescribed.  Please keep your appointment with your primary doctor on December 12 for a recheck of your symptoms.  Please take 1000 mg of Tylenol every 6 hours as needed for fevers.  Please continue to take your other medications.  Please return to the emergency department if you develop any persistent fevers, weakness, difficulty breathing, worsening symptoms, severe pain, fainting, or any other concerning symptoms.

## 2022-04-15 NOTE — ED Triage Notes (Signed)
Patient reports persistent productive cough with chest congestion , nasal congestion and headache for 2 weeks , afebrile .

## 2022-04-19 ENCOUNTER — Ambulatory Visit: Payer: Self-pay

## 2022-04-19 ENCOUNTER — Ambulatory Visit: Payer: Medicare Other | Admitting: Family Medicine

## 2022-04-19 NOTE — Telephone Encounter (Signed)
Noted  

## 2022-04-19 NOTE — Telephone Encounter (Signed)
  Chief Complaint: Can pt rtw? Symptoms: Resolving Frequency:  Pertinent Negatives: Patient denies Fever, lethargy and all other s/s from ED note Disposition: [] ED /[] Urgent Care (no appt availability in office) / [] Appointment(In office/virtual)/ []  [facility]/ [] Home Care/ [] Refused Recommended Disposition /[] [facility]/ [x]  Follow-up with PCP Additional Notes: PT called to ask if she could RTW tomorrow. Pt states she is feeling much better and has none of the concerning s/s listed in ED note. PT advised ok to RTW, but needs to look for concerning s/s and to call back if needed. Pt has appt with Dr. on the 18th.  Please advise.    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 Pt states that she was told to see her PCP. Pt has appt with PCP next Monday. She said that ER told her to get an X-ray from PCP to determine if ok to go back to work, no appt available so wants to know if would be ok to go back to work and keep her appt nxt Monday.  Pt states not her issue that sooner appt not available. Pt is not really open to much suggestion as it is not her fault no appt and wants an answer regardless of seeing PCP.  FU [phone]          Excerpt of ED note:  Patient well-appearing, exam with some right lower lobe rhonchi.  X-ray also with right lower lobe infiltrate concerning for pneumonia.  Will treat for community-acquired pneumonia.  Patient clinically well-appearing, no hypotension, tachycardia to suggest sepsis.  Patient does have borderline fever in the emergency department, received Tylenol.  Patient has close outpatient follow-up with her primary doctor in approximately 1 week.  Given age discussed strict return precautions for any worsening symptoms, weakness, lethargy, rapid heart beat, or any other concerning symptoms. Will discharge patient to home. All  questions answered. Patient comfortable with plan of discharge. Return precautions discussed with patient and specified on the after visit summary.   Reason for Disposition  [1] Caller requesting NON-URGENT health information AND [2] PCP's office is the best resource  Answer Assessment - Initial Assessment Questions 1. REASON FOR CALL or QUESTION: "What is your reason for calling today?" or "How can I best help you?" or "What question do you have that I can help answer?"     Can pt return to work  Protocols used: Information Only Call - No Triage-A-AH

## 2022-04-20 ENCOUNTER — Ambulatory Visit: Payer: Medicare Other | Admitting: Family Medicine

## 2022-04-22 ENCOUNTER — Other Ambulatory Visit: Payer: Self-pay

## 2022-04-22 ENCOUNTER — Other Ambulatory Visit (HOSPITAL_COMMUNITY): Payer: Self-pay

## 2022-04-26 ENCOUNTER — Other Ambulatory Visit: Payer: Self-pay

## 2022-04-26 ENCOUNTER — Ambulatory Visit: Payer: Medicare Other | Attending: Family Medicine | Admitting: Family Medicine

## 2022-04-26 ENCOUNTER — Encounter: Payer: Self-pay | Admitting: Family Medicine

## 2022-04-26 VITALS — BP 149/89 | HR 79 | Temp 97.8°F | Ht 66.0 in | Wt 207.2 lb

## 2022-04-26 DIAGNOSIS — Z79899 Other long term (current) drug therapy: Secondary | ICD-10-CM | POA: Diagnosis not present

## 2022-04-26 DIAGNOSIS — Z6833 Body mass index (BMI) 33.0-33.9, adult: Secondary | ICD-10-CM | POA: Diagnosis not present

## 2022-04-26 DIAGNOSIS — Z8616 Personal history of COVID-19: Secondary | ICD-10-CM | POA: Insufficient documentation

## 2022-04-26 DIAGNOSIS — R7303 Prediabetes: Secondary | ICD-10-CM | POA: Insufficient documentation

## 2022-04-26 DIAGNOSIS — Z8679 Personal history of other diseases of the circulatory system: Secondary | ICD-10-CM | POA: Insufficient documentation

## 2022-04-26 DIAGNOSIS — E669 Obesity, unspecified: Secondary | ICD-10-CM | POA: Diagnosis not present

## 2022-04-26 DIAGNOSIS — I10 Essential (primary) hypertension: Secondary | ICD-10-CM | POA: Insufficient documentation

## 2022-04-26 DIAGNOSIS — E0590 Thyrotoxicosis, unspecified without thyrotoxic crisis or storm: Secondary | ICD-10-CM | POA: Diagnosis not present

## 2022-04-26 DIAGNOSIS — Z7985 Long-term (current) use of injectable non-insulin antidiabetic drugs: Secondary | ICD-10-CM | POA: Diagnosis not present

## 2022-04-26 MED ORDER — OZEMPIC (0.25 OR 0.5 MG/DOSE) 2 MG/3ML ~~LOC~~ SOPN
0.2500 mg | PEN_INJECTOR | SUBCUTANEOUS | 6 refills | Status: DC
  Filled 2022-04-26: qty 3, 28d supply, fill #0
  Filled 2022-05-21 – 2022-06-18 (×2): qty 3, 28d supply, fill #1

## 2022-04-26 NOTE — Progress Notes (Signed)
Discuss Trulicity.

## 2022-04-26 NOTE — Patient Instructions (Signed)

## 2022-04-26 NOTE — Progress Notes (Signed)
Subjective:  Patient ID: Kayla Preston, female    DOB: 10-04-1950  Age: 70 y.o. MRN: [id number]  CC: Hypertension   HPI Deniesha Preston is a 71 y.o. year old female with a history of prediabetes (A1c 5.9), hypertension, hyperthyroidism (not on medication) previous history of SVT, history of COVID-19 in 03/2019 (status post monoclonal antibody infusion) seen for a follow up visit.    Interval History:  She had CAP 11 days ago and has completed her course of Augmentin and she feels better. She had Complains of GI symptoms with Trulicity and it was discontinued. She would like to try Ozempic instead of management of her prediabetes.  Her BP is elevated and she attributes it to taking the bus and walking from bed to the office.  Endorses adherence with her antihypertensive. Past Medical History:  Diagnosis Date   Atrial fibrillation ([place])    Atrial fibrillation ([place])    she had ablation which resolved intermittent afib   Hypertension    Obesity    Seasonal allergies     Past Surgical History:  Procedure Laterality Date   ABLATION      Family History  Problem Relation Age of Onset   Heart disease Mother    Heart disease Father    Hyperlipidemia Sister    Thyroid disease Neg Hx     Social History   Socioeconomic History   Marital status: Single    Spouse name: Not on file   Number of children: Not on file   Years of education: Not on file   Highest education level: Not on file  Occupational History   Not on file  Tobacco Use   Smoking status: Never   Smokeless tobacco: Never  Substance and Sexual Activity   Alcohol use: No   Drug use: No   Sexual activity: Not Currently  Other Topics Concern   Not on file  Social History Narrative   ** Merged History Encounter **       Social Determinants of Health   Financial Resource Strain: Not on file  Food Insecurity: Not on file  Transportation Needs: Not on file  Physical Activity: Not on file  Stress: Not on  file  Social Connections: Not on file    No Known Allergies  Outpatient Medications Prior to Visit  Medication Sig Dispense Refill   albuterol (PROAIR HFA) 108 (90 Base) MCG/ACT inhaler Inhale 2 puffs into the lungs every 6 (six) hours as needed. 6.7 g 1   aspirin EC 81 MG tablet Take 81 mg by mouth daily.     ezetimibe (ZETIA) 10 MG tablet Take 1 tablet (10 mg total) by mouth daily. 30 tablet 6   hydrochlorothiazide (HYDRODIURIL) 25 MG tablet Take 1/2 tablet by mouth every other day. (Keep upcoming appointment for further refills) 45 tablet 3   linaclotide (LINZESS) 145 MCG CAPS capsule TAKE 1 CAPSULE BY MOUTH AT LEAST 30 MINUTES BEFORE THE FIRST MEAL OF THE DAY ON AN EMPTY STOMACH (must make office visit for further refills 90 capsule 1   metoprolol succinate (TOPROL-XL) 50 MG 24 hr tablet TAKE 1 TABLET (50 MG TOTAL) BY MOUTH ONCE DAILY. 90 tablet 3   linaclotide (LINZESS) 145 MCG CAPS capsule TAKE 1 CAPSULE BY MOUTH AT LEAST 30 MINUTES BEFORE THE FIRST MEAL OF THE DAY ON AN EMPTY STOMACH 90 capsule 2   amoxicillin-clavulanate (AUGMENTIN) 875-125 MG tablet Take 1 tablet by mouth every 12 (twelve) hours. (Patient not taking: Reported on 04/26/2022) 14  tablet 0   azithromycin (ZITHROMAX) 250 MG tablet Take 1 tablet (250 mg total) by mouth daily. Take first 2 tablets together, then 1 every day until finished. (Patient not taking: Reported on 04/26/2022) 6 tablet 0   Dulaglutide (TRULICITY) 6.78 LF/8.1OF SOPN Inject 0.75 mg into the skin once a week. (Patient not taking: Reported on 04/26/2022) 2 mL 6   No facility-administered medications prior to visit.     ROS Review of Systems  Constitutional:  Negative for activity change and appetite change.  HENT:  Negative for sinus pressure and sore throat.   Respiratory:  Negative for chest tightness, shortness of breath and wheezing.   Cardiovascular:  Negative for chest pain and palpitations.  Gastrointestinal:  Negative for abdominal  distention, abdominal pain and constipation.  Genitourinary: Negative.   Musculoskeletal: Negative.   Psychiatric/Behavioral:  Negative for behavioral problems and dysphoric mood.     Objective:  BP (!) 149/89   Pulse 79   Temp 97.8 F (36.6 C) (Oral)   Ht _0  (1.676 m)   Wt 207 lb 3.2 oz (94 kg)   SpO2 96%   BMI 33.44 kg/m      04/26/2022    3:00 PM 04/26/2022    2:31 PM 04/15/2022   10:56 AM  BP/Weight  Systolic BP 751 025 852  Diastolic BP 89 83 72  Wt. (Lbs)  207.2   BMI  33.44 kg/m2       Physical Exam Constitutional:      Appearance: She is well-developed.  Cardiovascular:     Rate and Rhythm: Normal rate.     Heart sounds: Normal heart sounds. No murmur heard. Pulmonary:     Effort: Pulmonary effort is normal.     Breath sounds: Normal breath sounds. No wheezing or rales.  Chest:     Chest wall: No tenderness.  Abdominal:     General: Bowel sounds are normal. There is no distension.     Palpations: Abdomen is soft. There is no mass.     Tenderness: There is no abdominal tenderness.  Musculoskeletal:        General: Normal range of motion.     Right lower leg: No edema.     Left lower leg: No edema.  Neurological:     Mental Status: She is alert and oriented to person, place, and time.  Psychiatric:        Mood and Affect: Mood normal.        Latest Ref Rng & Units 09/25/2021    8:45 AM 05/06/2021    9:23 AM 08/13/2020    9:28 PM  CMP  Glucose 70 - 99 mg/dL 89  99  105   BUN 8 - 27 mg/dL _1 Creatinine 0.57 - 1.00 mg/dL 0.70  0.78  0.93   Sodium 134 - 144 mmol/L 141  143  136   Potassium 3.5 - 5.2 mmol/L 4.2  3.9  4.1   Chloride 96 - 106 mmol/L 107  109  104   CO2 20 - 29 mmol/L _2 Calcium 8.7 - 10.3 mg/dL 9.8  9.5  9.1   Total Protein 6.0 - 8.5 g/dL 6.7  6.7    Total Bilirubin 0.0 - 1.2 mg/dL 0.3  0.3    Alkaline Phos 44 - 121 IU/L 80  87    AST 0 - 40 IU/L 15  17    ALT 0 - 32  IU/L 24  17      Lipid Panel      Component Value Date/Time   CHOL 210 (H) 09/25/2021 0845   TRIG 75 09/25/2021 0845   HDL 65 09/25/2021 0845   CHOLHDL 3.2 09/25/2021 0845   CHOLHDL 3.4 05/17/2016 1004   VLDL 18 05/17/2016 1004   LDLCALC 132 (H) 09/25/2021 0845    CBC    Component Value Date/Time   WBC 4.5 09/25/2021 0845   WBC 6.7 08/13/2020 2128   RBC 5.64 (H) 09/25/2021 0845   RBC 5.76 (H) 08/13/2020 2128   HGB 13.2 09/25/2021 0845   HCT 42.3 09/25/2021 0845   PLT 250 09/25/2021 0845   MCV 75 (L) 09/25/2021 0845   MCH 23.4 (L) 09/25/2021 0845   MCH 23.4 (L) 08/13/2020 2128   MCHC 31.2 (L) 09/25/2021 0845   MCHC 31.7 08/13/2020 2128   RDW 16.0 (H) 09/25/2021 0845   LYMPHSABS 1.7 09/25/2021 0845   MONOABS 0.8 08/13/2020 2128   EOSABS 0.1 09/25/2021 0845   BASOSABS 0.0 09/25/2021 0845    Lab Results  Component Value Date   HGBA1C 5.9 (H) 09/25/2021    Lab Results  Component Value Date   TSH 0.956 09/25/2021    Assessment & Plan:  1. Essential hypertension Slightly above goal blood pressure at last office visit was normal hence I will make no regimen change today Continue HCTZ If BP remains elevated at next visit consider switching to chlorthalidone Counseled on blood pressure goal of less than 130/80, low-sodium, DASH diet, medication compliance, 150 minutes of moderate intensity exercise per week. Discussed medication compliance, adverse effects. - CMP14+EGFR  2. Prediabetes Labs reveal prediabetes with an A1c of 5.9.  Working on a low carbohydrate diet, exercise, weight loss is recommended in order to prevent progression to type 2 diabetes mellitus. Previously unable to tolerate Trulicity due to GI side effects Will place on Ozempic per patient request - Semaglutide,0.25 or 0.5MG/DOS, (OZEMPIC, 0.25 OR 0.5 MG/DOSE,) 2 MG/3ML SOPN; Inject 0.25 mg into the skin once a week.  Dispense: 3 mL; Refill: 6 - Hemoglobin A1c  3. Mild obesity Caloric restriction, increase physical activity Placed  on GLP-1 RA  4. Hyperthyroidism Normal Not on medication Thyroid panel is normal   Meds ordered this encounter  Medications   Semaglutide,0.25 or 0.5MG/DOS, (OZEMPIC, 0.25 OR 0.5 MG/DOSE,) 2 MG/3ML SOPN    Sig: Inject 0.25 mg into the skin once a week.    Dispense:  3 mL    Refill:  6    Discontinue Trulicity    Follow-up: Return in about 3 months (around 07/26/2022) for Follow-up on hypertension.       [name], MD, FAAFP. [facility] and [facility], [place]   04/26/2022, 5:27 PM

## 2022-04-27 ENCOUNTER — Telehealth: Payer: Self-pay

## 2022-04-27 LAB — CMP14+EGFR
ALT: 21 IU/L (ref 0–32)
AST: 16 IU/L (ref 0–40)
Albumin/Globulin Ratio: 1.7 (ref 1.2–2.2)
Albumin: 4.1 g/dL (ref 3.9–4.9)
Alkaline Phosphatase: 86 IU/L (ref 44–121)
BUN/Creatinine Ratio: 14 (ref 12–28)
BUN: 11 mg/dL (ref 8–27)
Bilirubin Total: 0.3 mg/dL (ref 0.0–1.2)
CO2: 21 mmol/L (ref 20–29)
Calcium: 9.5 mg/dL (ref 8.7–10.3)
Chloride: 105 mmol/L (ref 96–106)
Creatinine, Ser: 0.78 mg/dL (ref 0.57–1.00)
Globulin, Total: 2.4 g/dL (ref 1.5–4.5)
Glucose: 83 mg/dL (ref 70–99)
Potassium: 3.8 mmol/L (ref 3.5–5.2)
Sodium: 141 mmol/L (ref 134–144)
Total Protein: 6.5 g/dL (ref 6.0–8.5)
eGFR: 82 mL/min/{1.73_m2} (ref >59)

## 2022-04-27 LAB — HEMOGLOBIN A1C
Est. average glucose Bld gHb Est-mCnc: 131 mg/dL
Hgb A1c MFr Bld: 6.2 % — ABNORMAL HIGH (ref 4.8–5.6)

## 2022-04-27 NOTE — Telephone Encounter (Signed)
     Patient  visit on 12/7  at [facility]   Have you been able to follow up with your primary care physician? Yes   The patient was or was not able to obtain any needed medicine or equipment. Yes   Are there diet recommendations that you are having difficulty following? NA  Patient expresses understanding of discharge instructions and education provided has no other needs at this time.  Yes      Lenard Forth [facility], [facility], Care Management  [phone] [address] [place], [place], [place] [address] Phone: [phone] Email: [email or web address]

## 2022-04-28 ENCOUNTER — Other Ambulatory Visit: Payer: Self-pay

## 2022-05-14 ENCOUNTER — Other Ambulatory Visit: Payer: Self-pay

## 2022-05-14 ENCOUNTER — Other Ambulatory Visit (HOSPITAL_COMMUNITY): Payer: Self-pay

## 2022-05-17 ENCOUNTER — Ambulatory Visit: Payer: Self-pay | Admitting: *Deleted

## 2022-05-17 ENCOUNTER — Ambulatory Visit: Payer: Medicare Other | Attending: Family Medicine | Admitting: Family Medicine

## 2022-05-17 ENCOUNTER — Encounter: Payer: Self-pay | Admitting: Family Medicine

## 2022-05-17 DIAGNOSIS — R052 Subacute cough: Secondary | ICD-10-CM

## 2022-05-17 DIAGNOSIS — R7303 Prediabetes: Secondary | ICD-10-CM | POA: Insufficient documentation

## 2022-05-17 DIAGNOSIS — R059 Cough, unspecified: Secondary | ICD-10-CM | POA: Insufficient documentation

## 2022-05-17 DIAGNOSIS — I10 Essential (primary) hypertension: Secondary | ICD-10-CM | POA: Diagnosis not present

## 2022-05-17 DIAGNOSIS — E0590 Thyrotoxicosis, unspecified without thyrotoxic crisis or storm: Secondary | ICD-10-CM | POA: Diagnosis not present

## 2022-05-17 DIAGNOSIS — Z8616 Personal history of COVID-19: Secondary | ICD-10-CM | POA: Diagnosis not present

## 2022-05-17 MED ORDER — PREDNISONE 20 MG PO TABS: 20.0000 mg | ORAL_TABLET | Freq: Every day | ORAL | 0 refills | Status: DC

## 2022-05-17 NOTE — Telephone Encounter (Signed)
Scheduled apt for MyChart visit today at 1330 with PCP.

## 2022-05-17 NOTE — Patient Instructions (Signed)

## 2022-05-17 NOTE — Telephone Encounter (Signed)
  Chief Complaint: requesting chest x-ray, SOB with exertion Symptoms: persisting bothersome cough and SOB with exertion- patient thinks recent pneumonia not cleared- requesting chest x-ray order Frequency: symptoms did get better after treatment - and was better when seen in office- but patient states persisting cough is still present and SOB with exertion is not improved either- offered appointment- but patient declines due to transportation- can not get to [facility] Pertinent Negatives: Patient denies dizziness, runny nose, chest pain, fever  Disposition: [] ED /[] Urgent Care (no appt availability in office) / [] Appointment(In office/virtual)/ []  [facility] [facility]/ [] Home Care/ [x] Refused Recommended Disposition /[] [facility]/ []  Follow-up with PCP Additional Notes: Offered appointment- but patient wants to start with chest X ray and move forward from there- she states follow up X ray was recommended by  ED- but she felt so good when in office she did not pursue.

## 2022-05-17 NOTE — Progress Notes (Signed)
Virtual Visit via Video Note  I connected with Kayla Preston, on 05/17/2022 at 2:18 PM by video enabled telemedicine device and verified that I am speaking with the correct person using two identifiers.   Consent: I discussed the limitations, risks, security and privacy concerns of performing an evaluation and management service by telemedicine and the availability of in person appointments. I also discussed with the patient that there may be a patient responsible charge related to this service. The patient expressed understanding and agreed to proceed.   Location of Patient: Home  Location of Provider: Clinic   Persons participating in Telemedicine visit: Kayla Preston Dr. Alvis Lemmings     History of Present Illness: Kayla Preston is a 72 y.o. year old female  with a history of prediabetes (A1c 5.9), hypertension, hyperthyroidism (not on medication) previous history of SVT, history of COVID-19 in 03/2019 (status post monoclonal antibody infusion) seen for a follow up visit.       She has a cough which is worse at night. She has no post nasal drip and she has a hard time coughing it up. She is using Robitussin with mild relief and cough drops. The 'whistle' and wheezing is gone. Last month she was treated for community-acquired pneumonia with Augmentin and azithromycin after which the cough never completely went away. Past Medical History:  Diagnosis Date   Atrial fibrillation ([facility])    Atrial fibrillation ([facility])    she had ablation which resolved intermittent afib   Hypertension    Obesity    Seasonal allergies    No Known Allergies  Current Outpatient Medications on File Prior to Visit  Medication Sig Dispense Refill   albuterol (PROAIR HFA) 108 (90 Base) MCG/ACT inhaler Inhale 2 puffs into the lungs every 6 (six) hours as needed. 6.7 g 1   aspirin EC 81 MG tablet Take 81 mg by mouth daily.     ezetimibe (ZETIA) 10 MG tablet Take 1 tablet (10 mg total) by mouth daily. 30  tablet 6   hydrochlorothiazide (HYDRODIURIL) 25 MG tablet Take 1/2 tablet by mouth every other day. (Keep upcoming appointment for further refills) 45 tablet 3   linaclotide (LINZESS) 145 MCG CAPS capsule TAKE 1 CAPSULE BY MOUTH AT LEAST 30 MINUTES BEFORE THE FIRST MEAL OF THE DAY ON AN EMPTY STOMACH 90 capsule 2   linaclotide (LINZESS) 145 MCG CAPS capsule TAKE 1 CAPSULE BY MOUTH AT LEAST 30 MINUTES BEFORE THE FIRST MEAL OF THE DAY ON AN EMPTY STOMACH (must make office visit for further refills 90 capsule 1   metoprolol succinate (TOPROL-XL) 50 MG 24 hr tablet TAKE 1 TABLET (50 MG TOTAL) BY MOUTH ONCE DAILY. 90 tablet 3   Semaglutide,0.25 or 0.5MG /DOS, (OZEMPIC, 0.25 OR 0.5 MG/DOSE,) 2 MG/3ML SOPN Inject 0.25 mg into the skin once a week. 3 mL 6   No current facility-administered medications on file prior to visit.    ROS: See HPI  Observations/Objective: Awake, alert, oriented x3 Not in acute distress No sinus tenderness to palpation Normal respiratory rate on observation Normal mood      Latest Ref Rng & Units 04/26/2022    3:17 PM 09/25/2021    8:45 AM 05/06/2021    9:23 AM  CMP  Glucose 70 - 99 mg/dL 83  89  99   BUN 8 - 27 mg/dL 11  15  14    Creatinine 0.57 - 1.00 mg/dL  3.41  9.37   Sodium 134 - 144 mmol/L 141  141  143   Potassium 3.5 - 5.2 mmol/L 3.8  4.2  3.9   Chloride 96 - 106 mmol/L 105  107  109   CO2 20 - 29 mmol/L 21  23  22    Calcium 8.7 - 10.3 mg/dL 9.5  9.8  9.5   Total Protein 6.0 - 8.5 g/dL 6.5  6.7  6.7   Total Bilirubin 0.0 - 1.2 mg/dL 0.3  0.3  0.3   Alkaline Phos 44 - 121 IU/L 86  80  87   AST 0 - 40 IU/L 16  15  17    ALT 0 - 32 IU/L 21  24  17      Lipid Panel     Component Value Date/Time   CHOL 210 (H) 09/25/2021 0845   TRIG 75 09/25/2021 0845   HDL 65 09/25/2021 0845   CHOLHDL 3.2 09/25/2021 0845   CHOLHDL 3.4 05/17/2016 1004   VLDL 18 05/17/2016 1004   LDLCALC 132 (H) 09/25/2021 0845   LABVLDL 13 09/25/2021 0845    Lab Results   Component Value Date   HGBA1C 6.2 (H) 04/26/2022     Assessment and Plan: 1. Subacute cough History of community-acquired pneumonia 1 month ago treated with Augmentin and azithromycin Symptoms are uncontrolled on OTC antitussives Will add short course of prednisone - predniSONE (DELTASONE) 20 MG tablet; Take 1 tablet (20 mg total) by mouth daily with breakfast.  Dispense: 5 tablet; Refill: 0   Follow Up Instructions: Keep previously scheduled appointment   I discussed the assessment and treatment plan with the patient. The patient was provided an opportunity to ask questions and all were answered. The patient agreed with the plan and demonstrated an understanding of the instructions.   The patient was advised to call back or seek an in-person evaluation if the symptoms worsen or if the condition fails to improve as anticipated.     I provided 12 minutes total of Telehealth time during this encounter including median intraservice time, reviewing previous notes, investigations, ordering medications, medical decision making, coordinating care and patient verbalized understanding at the end of the visit.     [name], MD, FAAFP. [facility] and [place] [facility], [place]   05/17/2022, 2:18 PM

## 2022-05-17 NOTE — Telephone Encounter (Signed)
Summary: pt symptoms returned from previous [place] wants x-ray called in   Pt is again calling back on symptoms from the 7th, seen in ER and x-ray done, CC was done and pt had fu but stated better, cancelled appt.... now states that is having symptoms still and wants another x-Ray ordered. [phone] See previous CC         Reason for Disposition  [1] MILD difficulty breathing (e.g., minimal/no SOB at rest, SOB with walking, pulse <100) AND [2] NEW-onset or WORSE than normal  Answer Assessment - Initial Assessment Questions 1. RESPIRATORY STATUS: "Describe your breathing?" (e.g., wheezing, shortness of breath, unable to speak, severe coughing)      Out of breath with walking, cough- think mucus 2. ONSET: "When did this breathing problem begin?"      12/18- OV follow up 3. PATTERN "Does the difficult breathing come and go, or has it been constant since it started?"      Still having symptoms- cough 4. SEVERITY: "How bad is your breathing?" (e.g., mild, moderate, severe)    - MILD: No SOB at rest, mild SOB with walking, speaks normally in sentences, can lie down, no retractions, pulse < 100.    - MODERATE: SOB at rest, SOB with minimal exertion and prefers to sit, cannot lie down flat, speaks in phrases, mild retractions, audible wheezing, pulse 100-120.    - SEVERE: Very SOB at rest, speaks in single words, struggling to breathe, sitting hunched forward, retractions, pulse > 120      Mild- using inhaler 5. RECURRENT SYMPTOM: "Have you had difficulty breathing before?" If Yes, ask: "When was the last time?" and "What happened that time?"      Hx pneumonia recently 6. CARDIAC HISTORY: "Do you have any history of heart disease?" (e.g., heart attack, angina, bypass surgery, angioplasty)      na 7. LUNG HISTORY: "Do you have any history of lung disease?"  (e.g., pulmonary embolus, asthma, emphysema)     Recent pneumonia treatment  8. CAUSE: "What do you think is causing the breathing problem?"       Recent pneumonia  9. OTHER SYMPTOMS: "Do you have any other symptoms? (e.g., dizziness, runny nose, cough, chest pain, fever)     cough  Protocols used: Breathing Difficulty-A-AH

## 2022-05-21 ENCOUNTER — Other Ambulatory Visit: Payer: Self-pay

## 2022-05-21 MED ORDER — INFLUENZA VAC A&B SA ADJ QUAD 0.5 ML IM PRSY
0.5000 mL | PREFILLED_SYRINGE | Freq: Once | INTRAMUSCULAR | 0 refills | Status: AC
  Filled 2022-05-21: qty 0.5, 1d supply, fill #0

## 2022-05-21 MED ORDER — COVID-19 MRNA VAC-TRIS(PFIZER) 30 MCG/0.3ML IM SUSY
0.3000 mL | PREFILLED_SYRINGE | Freq: Once | INTRAMUSCULAR | 0 refills | Status: AC
  Filled 2022-05-21: qty 0.3, 1d supply, fill #0

## 2022-05-25 ENCOUNTER — Other Ambulatory Visit: Payer: Self-pay

## 2022-05-28 ENCOUNTER — Other Ambulatory Visit: Payer: Self-pay

## 2022-05-28 MED ORDER — LINZESS 145 MCG PO CAPS
145.0000 ug | ORAL_CAPSULE | Freq: Every day | ORAL | 0 refills | Status: DC
  Filled 2022-05-28 – 2022-06-18 (×5): qty 90, 90d supply, fill #0

## 2022-05-29 DIAGNOSIS — Z23 Encounter for immunization: Secondary | ICD-10-CM | POA: Diagnosis not present

## 2022-05-31 ENCOUNTER — Other Ambulatory Visit: Payer: Self-pay

## 2022-06-18 ENCOUNTER — Other Ambulatory Visit: Payer: Self-pay

## 2022-06-18 ENCOUNTER — Other Ambulatory Visit: Payer: Self-pay | Admitting: Family Medicine

## 2022-06-18 MED ORDER — EZETIMIBE 10 MG PO TABS
10.0000 mg | ORAL_TABLET | Freq: Every day | ORAL | 1 refills | Status: DC
  Filled 2022-06-18: qty 90, 90d supply, fill #0

## 2022-06-21 ENCOUNTER — Other Ambulatory Visit: Payer: Self-pay

## 2022-07-07 ENCOUNTER — Other Ambulatory Visit: Payer: Self-pay

## 2022-07-16 ENCOUNTER — Other Ambulatory Visit: Payer: Self-pay

## 2022-07-19 ENCOUNTER — Other Ambulatory Visit: Payer: Self-pay

## 2022-07-20 ENCOUNTER — Telehealth: Payer: Self-pay | Admitting: Family Medicine

## 2022-07-20 NOTE — Telephone Encounter (Signed)
Contacted Kayla Preston to schedule their annual wellness visit. Call back at later date: [place] [facility] direct phone # [phone]   Spoke to patient to schedule AWV prior to 3/18 appt with provider.  Patient could only due Friday/Monday  could not make appt prior to provider appt  I will call back later in year to try to schedule AWV

## 2022-07-26 ENCOUNTER — Other Ambulatory Visit: Payer: Self-pay

## 2022-07-26 ENCOUNTER — Ambulatory Visit: Payer: Medicare Other | Admitting: Family Medicine

## 2022-07-26 ENCOUNTER — Ambulatory Visit: Payer: Medicare Other | Attending: Family Medicine | Admitting: Family Medicine

## 2022-07-26 ENCOUNTER — Encounter: Payer: Self-pay | Admitting: Family Medicine

## 2022-07-26 VITALS — BP 131/75 | HR 89 | Temp 97.9°F | Ht 66.0 in | Wt 208.6 lb

## 2022-07-26 DIAGNOSIS — E785 Hyperlipidemia, unspecified: Secondary | ICD-10-CM | POA: Insufficient documentation

## 2022-07-26 DIAGNOSIS — Z8616 Personal history of COVID-19: Secondary | ICD-10-CM | POA: Diagnosis not present

## 2022-07-26 DIAGNOSIS — E0590 Thyrotoxicosis, unspecified without thyrotoxic crisis or storm: Secondary | ICD-10-CM | POA: Insufficient documentation

## 2022-07-26 DIAGNOSIS — Z6833 Body mass index (BMI) 33.0-33.9, adult: Secondary | ICD-10-CM | POA: Diagnosis not present

## 2022-07-26 DIAGNOSIS — E7800 Pure hypercholesterolemia, unspecified: Secondary | ICD-10-CM | POA: Diagnosis not present

## 2022-07-26 DIAGNOSIS — I10 Essential (primary) hypertension: Secondary | ICD-10-CM | POA: Diagnosis not present

## 2022-07-26 DIAGNOSIS — Z7951 Long term (current) use of inhaled steroids: Secondary | ICD-10-CM | POA: Insufficient documentation

## 2022-07-26 DIAGNOSIS — Z7952 Long term (current) use of systemic steroids: Secondary | ICD-10-CM | POA: Insufficient documentation

## 2022-07-26 DIAGNOSIS — R7303 Prediabetes: Secondary | ICD-10-CM

## 2022-07-26 DIAGNOSIS — M545 Low back pain, unspecified: Secondary | ICD-10-CM

## 2022-07-26 DIAGNOSIS — Z79899 Other long term (current) drug therapy: Secondary | ICD-10-CM | POA: Insufficient documentation

## 2022-07-26 DIAGNOSIS — E669 Obesity, unspecified: Secondary | ICD-10-CM | POA: Insufficient documentation

## 2022-07-26 DIAGNOSIS — K5909 Other constipation: Secondary | ICD-10-CM | POA: Diagnosis not present

## 2022-07-26 DIAGNOSIS — G8929 Other chronic pain: Secondary | ICD-10-CM | POA: Insufficient documentation

## 2022-07-26 DIAGNOSIS — Z7982 Long term (current) use of aspirin: Secondary | ICD-10-CM | POA: Diagnosis not present

## 2022-07-26 DIAGNOSIS — Z7985 Long-term (current) use of injectable non-insulin antidiabetic drugs: Secondary | ICD-10-CM | POA: Diagnosis not present

## 2022-07-26 LAB — POCT GLYCOSYLATED HEMOGLOBIN (HGB A1C): HbA1c, POC (controlled diabetic range): 6 % (ref 0.0–7.0)

## 2022-07-26 MED ORDER — HYDROCHLOROTHIAZIDE 25 MG PO TABS
12.5000 mg | ORAL_TABLET | ORAL | 1 refills | Status: DC
  Filled 2022-07-26: qty 45, 180d supply, fill #0
  Filled 2022-10-14: qty 20, 80d supply, fill #0
  Filled 2023-01-03: qty 20, 80d supply, fill #1

## 2022-07-26 MED ORDER — METOPROLOL SUCCINATE ER 50 MG PO TB24
50.0000 mg | ORAL_TABLET | Freq: Every day | ORAL | 1 refills | Status: DC
  Filled 2022-07-26: qty 90, fill #0
  Filled 2022-10-14: qty 90, 90d supply, fill #0
  Filled 2023-01-11: qty 90, 90d supply, fill #1

## 2022-07-26 MED ORDER — LINACLOTIDE 145 MCG PO CAPS
145.0000 ug | ORAL_CAPSULE | Freq: Every day | ORAL | 1 refills | Status: DC
  Filled 2022-07-26: qty 90, 90d supply, fill #0
  Filled 2022-09-17: qty 60, 60d supply, fill #0
  Filled 2022-11-15: qty 60, 60d supply, fill #1
  Filled 2023-01-14: qty 60, 60d supply, fill #2

## 2022-07-26 MED ORDER — EZETIMIBE 10 MG PO TABS
10.0000 mg | ORAL_TABLET | Freq: Every day | ORAL | 1 refills | Status: DC
  Filled 2022-07-26 – 2022-09-17 (×2): qty 90, 90d supply, fill #0
  Filled 2022-12-20: qty 90, 90d supply, fill #1

## 2022-07-26 NOTE — Patient Instructions (Signed)

## 2022-07-26 NOTE — Addendum Note (Signed)
Addended by: [name] on: 07/26/2022 01:36 PM   Modules accepted: Level of Service

## 2022-07-26 NOTE — Progress Notes (Signed)
Subjective:  Patient ID: Kayla Preston, female    DOB: 1951-02-27  Age: 72 y.o. MRN: [id number]  CC: Hypertension   HPI Kayla Preston is a 72 y.o. year old female with a history of prediabetes (A1c 6.0), hypertension, hyperthyroidism (not on medication) previous history of SVT, history of COVID-19 in 03/2019 (status post monoclonal antibody infusion) seen for a follow up visit.       Interval History:  When she rises from a seating position her back feels a bit stiff and she has to walk it off initially and it takes [place] for symptoms to improve. At work she stands 5-7 hours as well. She took a fall 2 years ago and hit her back.  Denies presence of radiculopathy, numbness in extremities.  She did have physical therapy shortly after she took that fall.  She has now cut her hours to 5 hours 3 days a week at work. Endorses adherence with her antihypertensive. She is on Zetia for hyperlipidemia.  Ozempic was never approved by her insurance Past Medical History:  Diagnosis Date   Atrial fibrillation ([place])    Atrial fibrillation ([place])    she had ablation which resolved intermittent afib   Hypertension    Obesity    Seasonal allergies     Past Surgical History:  Procedure Laterality Date   ABLATION      Family History  Problem Relation Age of Onset   Heart disease Mother    Heart disease Father    Hyperlipidemia Sister    Thyroid disease Neg Hx     Social History   Socioeconomic History   Marital status: Single    Spouse name: Not on file   Number of children: Not on file   Years of education: Not on file   Highest education level: Not on file  Occupational History   Not on file  Tobacco Use   Smoking status: Never   Smokeless tobacco: Never  Substance and Sexual Activity   Alcohol use: No   Drug use: No   Sexual activity: Not Currently  Other Topics Concern   Not on file  Social History Narrative   ** Merged History Encounter **       Social  Determinants of Health   Financial Resource Strain: Not on file  Food Insecurity: Not on file  Transportation Needs: Not on file  Physical Activity: Not on file  Stress: Not on file  Social Connections: Not on file    No Known Allergies  Outpatient Medications Prior to Visit  Medication Sig Dispense Refill   albuterol (PROAIR HFA) 108 (90 Base) MCG/ACT inhaler Inhale 2 puffs into the lungs every 6 (six) hours as needed. 6.7 g 1   aspirin EC 81 MG tablet Take 81 mg by mouth daily.     ezetimibe (ZETIA) 10 MG tablet Take 1 tablet (10 mg total) by mouth daily. 90 tablet 1   hydrochlorothiazide (HYDRODIURIL) 25 MG tablet Take 0.5 tablets (12.5 mg total) by mouth every other day.(Keep upcoming appointment for further refills) 45 tablet 3   linaclotide (LINZESS) 145 MCG CAPS capsule TAKE 1 CAPSULE BY MOUTH AT LEAST 30 MINUTES BEFORE THE FIRST MEAL OF THE DAY ON AN EMPTY STOMACH (must make office visit for further refills 90 capsule 0   metoprolol succinate (TOPROL-XL) 50 MG 24 hr tablet TAKE 1 TABLET (50 MG TOTAL) BY MOUTH ONCE DAILY. 90 tablet 3   linaclotide (LINZESS) 145 MCG CAPS capsule TAKE 1 CAPSULE BY MOUTH  AT LEAST 30 MINUTES BEFORE THE FIRST MEAL OF THE DAY ON AN EMPTY STOMACH 90 capsule 2   predniSONE (DELTASONE) 20 MG tablet Take 1 tablet (20 mg total) by mouth daily with breakfast. (Patient not taking: Reported on 07/26/2022) 5 tablet 0   Semaglutide,0.25 or 0.5MG /DOS, (OZEMPIC, 0.25 OR 0.5 MG/DOSE,) 2 MG/3ML SOPN Inject 0.25 mg into the skin once a week. (Patient not taking: Reported on 07/26/2022) 3 mL 6   No facility-administered medications prior to visit.     ROS Review of Systems  Constitutional:  Negative for activity change and appetite change.  HENT:  Negative for sinus pressure and sore throat.   Respiratory:  Negative for chest tightness, shortness of breath and wheezing.   Cardiovascular:  Negative for chest pain and palpitations.  Gastrointestinal:  Negative for  abdominal distention, abdominal pain and constipation.  Genitourinary: Negative.   Musculoskeletal:  Positive for back pain.  Psychiatric/Behavioral:  Negative for behavioral problems and dysphoric mood.     Objective:  BP 131/75   Pulse 89   Temp 97.9 F (36.6 C) (Oral)   Ht 5\' 6"  (1.676 m)   Wt 208 lb 9.6 oz (94.6 kg)   SpO2 96%   BMI 33.67 kg/m      07/26/2022   10:06 AM 07/26/2022    9:43 AM 04/26/2022    3:00 PM  BP/Weight  Systolic BP [id number]  Diastolic BP 75 69 89  Wt. (Lbs)  208.6   BMI  33.67 kg/m2       Physical Exam Constitutional:      Appearance: She is well-developed.  Cardiovascular:     Rate and Rhythm: Normal rate.     Heart sounds: Normal heart sounds. No murmur heard. Pulmonary:     Effort: Pulmonary effort is normal.     Breath sounds: Normal breath sounds. No wheezing or rales.  Chest:     Chest wall: No tenderness.  Abdominal:     General: Bowel sounds are normal. There is no distension.     Palpations: Abdomen is soft. There is no mass.     Tenderness: There is no abdominal tenderness.  Musculoskeletal:        General: Normal range of motion.     Right lower leg: No edema.     Left lower leg: No edema.  Neurological:     Mental Status: She is alert and oriented to person, place, and time.  Psychiatric:        Mood and Affect: Mood normal.        Latest Ref Rng & Units 04/26/2022    3:17 PM 09/25/2021    8:45 AM 05/06/2021    9:23 AM  CMP  Glucose 70 - 99 mg/dL 83  89  99   BUN 8 - 27 mg/dL 11  15  14    Creatinine 0.57 - 1.00 mg/dL 0.78  0.70  0.78   Sodium 134 - 144 mmol/L 141  141  143   Potassium 3.5 - 5.2 mmol/L 3.8  4.2  3.9   Chloride 96 - 106 mmol/L 105  107  109   CO2 20 - 29 mmol/L 21  23  22    Calcium 8.7 - 10.3 mg/dL 9.5  9.8  9.5   Total Protein 6.0 - 8.5 g/dL 6.5  6.7  6.7   Total Bilirubin 0.0 - 1.2 mg/dL 0.3  0.3  0.3   Alkaline Phos 44 - 121 IU/L 86  80  87   AST 0 - 40 IU/L 16  15  17    ALT 0 - 32 IU/L  21  24  17      Lipid Panel     Component Value Date/Time   CHOL 210 (H) 09/25/2021 0845   TRIG 75 09/25/2021 0845   HDL 65 09/25/2021 0845   CHOLHDL 3.2 09/25/2021 0845   CHOLHDL 3.4 05/17/2016 1004   VLDL 18 05/17/2016 1004   LDLCALC 132 (H) 09/25/2021 0845    CBC    Component Value Date/Time   WBC 4.5 09/25/2021 0845   WBC 6.7 08/13/2020 2128   RBC 5.64 (H) 09/25/2021 0845   RBC 5.76 (H) 08/13/2020 2128   HGB 13.2 09/25/2021 0845   HCT 42.3 09/25/2021 0845   PLT 250 09/25/2021 0845   MCV 75 (L) 09/25/2021 0845   MCH 23.4 (L) 09/25/2021 0845   MCH 23.4 (L) 08/13/2020 2128   MCHC 31.2 (L) 09/25/2021 0845   MCHC 31.7 08/13/2020 2128   RDW 16.0 (H) 09/25/2021 0845   LYMPHSABS 1.7 09/25/2021 0845   MONOABS 0.8 08/13/2020 2128   EOSABS 0.1 09/25/2021 0845   BASOSABS 0.0 09/25/2021 0845    Lab Results  Component Value Date   HGBA1C 6.0 07/26/2022   Lab Results  Component Value Date   TSH 0.956 09/25/2021     Assessment & Plan:  1. Prediabetes Labs reveal prediabetes with an A1c of 6.0.  Working on a low carbohydrate diet, exercise, weight loss is recommended in order to prevent progression to type 2 diabetes mellitus.  - POCT glycosylated hemoglobin (Hb [id number]) - Basic Metabolic Panel  2. Chronic bilateral low back pain without sciatica History of remote fall Likely osteoarthritis on superimposed history of remote fall Advised to apply heat or ice whichever is tolerated to painful areas. Counseled on evidence of improvement in pain control with regards to yoga, water aerobics, massage, home physical therapy, exercise as tolerated. - Ambulatory referral to Physical Therapy  3. Hyperthyroidism Not on medications Will check thyroid labs - T4, free - TSH - T3  4. Essential hypertension Controlled Counseled on blood pressure goal of less than 130/80, low-sodium, DASH diet, medication compliance, 150 minutes of moderate intensity exercise per week. Discussed  medication compliance, adverse effects. - hydrochlorothiazide (HYDRODIURIL) 25 MG tablet; Take 0.5 tablets (12.5 mg total) by mouth every other day.(Keep upcoming appointment for further refills)  Dispense: 45 tablet; Refill: 1 - metoprolol succinate (TOPROL-XL) 50 MG 24 hr tablet; TAKE 1 TABLET (50 MG TOTAL) BY MOUTH ONCE DAILY.  Dispense: 90 tablet; Refill: 1  5. Pure hypercholesterolemia Will check lipid panel at next visit - ezetimibe (ZETIA) 10 MG tablet; Take 1 tablet (10 mg total) by mouth daily.  Dispense: 90 tablet; Refill: 1  6. Other constipation - linaclotide (LINZESS) 145 MCG CAPS capsule; TAKE 1 CAPSULE BY MOUTH AT LEAST 30 MINUTES BEFORE THE FIRST MEAL OF THE DAY ON AN EMPTY STOMACH (must make office visit for further refills  Dispense: 90 capsule; Refill: 1    Meds ordered this encounter  Medications   ezetimibe (ZETIA) 10 MG tablet    Sig: Take 1 tablet (10 mg total) by mouth daily.    Dispense:  90 tablet    Refill:  1   hydrochlorothiazide (HYDRODIURIL) 25 MG tablet    Sig: Take 0.5 tablets (12.5 mg total) by mouth every other day.(Keep upcoming appointment for further refills)    Dispense:  45 tablet    Refill:  1   metoprolol succinate (TOPROL-XL) 50 MG 24 hr tablet    Sig: TAKE 1 TABLET (50 MG TOTAL) BY MOUTH ONCE DAILY.    Dispense:  90 tablet    Refill:  1   linaclotide (LINZESS) 145 MCG CAPS capsule    Sig: TAKE 1 CAPSULE BY MOUTH AT LEAST 30 MINUTES BEFORE THE FIRST MEAL OF THE DAY ON AN EMPTY STOMACH (must make office visit for further refills    Dispense:  90 capsule    Refill:  1    Follow-up: Return in about 6 months (around 01/26/2023) for Chronic medical conditions.       [name], MD, FAAFP. [facility] [place], [place]   07/26/2022, 10:45 AM

## 2022-07-26 NOTE — Progress Notes (Signed)
Pain in lower back. 

## 2022-07-27 LAB — BASIC METABOLIC PANEL
BUN/Creatinine Ratio: 13 (ref 12–28)
BUN: 11 mg/dL (ref 8–27)
CO2: 19 mmol/L — ABNORMAL LOW (ref 20–29)
Calcium: 9.8 mg/dL (ref 8.7–10.3)
Chloride: 108 mmol/L — ABNORMAL HIGH (ref 96–106)
Creatinine, Ser: 0.82 mg/dL (ref 0.57–1.00)
Glucose: 112 mg/dL — ABNORMAL HIGH (ref 70–99)
Potassium: 4.1 mmol/L (ref 3.5–5.2)
Sodium: 141 mmol/L (ref 134–144)
eGFR: 77 mL/min/{1.73_m2} (ref >59)

## 2022-07-27 LAB — TSH: TSH: 1.04 u[IU]/mL (ref 0.450–4.500)

## 2022-07-27 LAB — T3: T3, Total: 108 ng/dL (ref 71–180)

## 2022-07-27 LAB — T4, FREE: Free T4: 1.06 ng/dL (ref 0.82–1.77)

## 2022-09-10 ENCOUNTER — Ambulatory Visit: Payer: Medicare Other

## 2022-09-14 ENCOUNTER — Other Ambulatory Visit: Payer: Self-pay

## 2022-09-17 ENCOUNTER — Other Ambulatory Visit (HOSPITAL_COMMUNITY): Payer: Self-pay

## 2022-09-17 ENCOUNTER — Other Ambulatory Visit: Payer: Self-pay

## 2022-09-17 NOTE — Therapy (Signed)
Patient arrived to appointment. However, she decided to leave prior to being seen due to financial issues. - MJ

## 2022-09-20 ENCOUNTER — Other Ambulatory Visit: Payer: Self-pay | Admitting: Family Medicine

## 2022-09-20 ENCOUNTER — Telehealth: Payer: Self-pay | Admitting: Family Medicine

## 2022-09-20 ENCOUNTER — Other Ambulatory Visit: Payer: Self-pay

## 2022-09-20 MED ORDER — ALBUTEROL SULFATE HFA 108 (90 BASE) MCG/ACT IN AERS
2.0000 | INHALATION_SPRAY | Freq: Four times a day (QID) | RESPIRATORY_TRACT | 1 refills | Status: AC | PRN
  Filled 2022-09-20 – 2022-09-29 (×2): qty 6.7, 25d supply, fill #0

## 2022-09-20 NOTE — Telephone Encounter (Signed)
Contacted Kayla Preston to schedule their annual wellness visit. Appointment made for 09/22/2022.  Thank you,  [facility] Support [facility] Direct dial  [phone]

## 2022-09-23 ENCOUNTER — Other Ambulatory Visit: Payer: Self-pay

## 2022-09-24 ENCOUNTER — Other Ambulatory Visit: Payer: Self-pay

## 2022-09-28 ENCOUNTER — Ambulatory Visit: Payer: Medicare Other | Attending: Family Medicine

## 2022-09-28 VITALS — Ht 66.0 in | Wt 208.0 lb

## 2022-09-28 DIAGNOSIS — Z1231 Encounter for screening mammogram for malignant neoplasm of breast: Secondary | ICD-10-CM

## 2022-09-28 DIAGNOSIS — Z0000 Encounter for general adult medical examination without abnormal findings: Secondary | ICD-10-CM

## 2022-09-28 NOTE — Progress Notes (Addendum)
Subjective:   Kayla Preston is a 72 y.o. female who presents for Medicare Annual (Subsequent) preventive examination.  I connected with  Kayla Preston on 09/28/22 by a audio enabled telemedicine application and verified that I am speaking with the correct person using two identifiers.  Patient Location: Home  Provider Location: Home Office  I discussed the limitations of evaluation and management by telemedicine. The patient expressed understanding and agreed to proceed.  Review of Systems     Cardiac Risk Factors include: advanced age (>39men, >33 women);dyslipidemia;hypertension     Objective:    Today's Vitals   09/28/22 0831  Weight: 208 lb (94.3 kg)  Height: 5\' 6"  (1.676 m)   Body mass index is 33.57 kg/m.     09/28/2022    9:22 AM 04/15/2022    6:52 AM 03/11/2021    3:31 PM 08/13/2020    6:01 PM 03/28/2020    9:19 AM 10/31/2019   10:50 AM 03/06/2018    6:54 AM  Advanced Directives  Does Patient Have a Medical Advance Directive? No No No No No No No  Would patient like information on creating a medical advance directive? Yes (MAU/Ambulatory/Procedural Areas - Information given)    No - Patient declined      Current Medications (verified) Outpatient Encounter Medications as of 09/28/2022  Medication Sig   albuterol (PROAIR HFA) 108 (90 Base) MCG/ACT inhaler Inhale 2 puffs into the lungs every 6 (six) hours as needed.   aspirin EC 81 MG tablet Take 81 mg by mouth daily.   ezetimibe (ZETIA) 10 MG tablet Take 1 tablet (10 mg total) by mouth daily.   hydrochlorothiazide (HYDRODIURIL) 25 MG tablet Take 0.5 tablets (12.5 mg total) by mouth every other day.(Keep upcoming appointment for further refills)   linaclotide (LINZESS) 145 MCG CAPS capsule TAKE 1 CAPSULE BY MOUTH AT LEAST 30 MINUTES BEFORE THE FIRST MEAL OF THE DAY ON AN EMPTY STOMACH (must make office visit for further refills   metoprolol succinate (TOPROL-XL) 50 MG 24 hr tablet TAKE 1 TABLET (50 MG TOTAL) BY  MOUTH ONCE DAILY.   No facility-administered encounter medications on file as of 09/28/2022.    Allergies (verified) Patient has no known allergies.   History: Past Medical History:  Diagnosis Date   Atrial fibrillation ([facility])    Atrial fibrillation ([facility])    she had ablation which resolved intermittent afib   Hypertension    Obesity    Seasonal allergies    Past Surgical History:  Procedure Laterality Date   ABLATION     Family History  Problem Relation Age of Onset   Heart disease Mother    Heart disease Father    Hyperlipidemia Sister    Thyroid disease Neg Hx    Social History   Socioeconomic History   Marital status: Single    Spouse name: Not on file   Number of children: Not on file   Years of education: Not on file   Highest education level: Not on file  Occupational History   Not on file  Tobacco Use   Smoking status: Never   Smokeless tobacco: Never  Substance and Sexual Activity   Alcohol use: No   Drug use: No   Sexual activity: Not Currently  Other Topics Concern   Not on file  Social History Narrative   ** Merged History Encounter **       Social Determinants of Health   Financial Resource Strain: Low Risk  (09/28/2022)  Overall Financial Resource Strain (CARDIA)    Difficulty of Paying Living Expenses: Not hard at all  Food Insecurity: No Food Insecurity (09/28/2022)   Hunger Vital Sign    Worried About Running Out of Food in the Last Year: Never true    Ran Out of Food in the Last Year: Never true  Transportation Needs: No Transportation Needs (09/28/2022)   PRAPARE - Administrator, Civil Service (Medical): No    Lack of Transportation (Non-Medical): No  Physical Activity: Insufficiently Active (09/28/2022)   Exercise Vital Sign    Days of Exercise per Week: 3 days    Minutes of Exercise per Session: 30 min  Stress: No Stress Concern Present (09/28/2022)   [employer] of Occupational Health - Occupational Stress  Questionnaire    Feeling of Stress : Not at all  Social Connections: Unknown (09/28/2022)   Social Connection and Isolation Panel [NHANES]    Frequency of Communication with Friends and Family: More than three times a week    Frequency of Social Gatherings with Friends and Family: Three times a week    Attends Religious Services: More than 4 times per year    Active Member of Clubs or Organizations: No    Attends Banker Meetings: Never    Marital Status: Patient declined    Tobacco Counseling Counseling given: Not Answered   Clinical Intake:  Pre-visit preparation completed: Yes  Pain : No/denies pain  Diabetes: No  How often do you need to have someone help you when you read instructions, pamphlets, or other written materials from your doctor or pharmacy?: 1 - Never  Diabetic?No   Interpreter Needed?: No  Information entered by :: Kandis Fantasia LPN   Activities of Daily Living    09/28/2022    9:22 AM  In your present state of health, do you have any difficulty performing the following activities:  Hearing? 0  Vision? 0  Difficulty concentrating or making decisions? 0  Walking or climbing stairs? 0  Dressing or bathing? 0  Doing errands, shopping? 0  Preparing Food and eating ? N  Using the Toilet? N  In the past six months, have you accidently leaked urine? N  Do you have problems with loss of bowel control? N  Managing your Medications? N  Managing your Finances? N  Housekeeping or managing your Housekeeping? N    Patient Care Team: Hoy Register, MD as PCP - General (Family Medicine)  Indicate any recent Medical Services you may have received from other than [facility] providers in the past year (date may be approximate).     Assessment:   This is a routine wellness examination for Kayla Preston.  Hearing/Vision screen Hearing Screening - Comments:: Denies hearing difficulties   Vision Screening - Comments:: Wears rx glasses - up to date with  routine eye exams with Dr. Dione Booze     Dietary issues and exercise activities discussed: Current Exercise Habits: Home exercise routine, Type of exercise: stretching;walking, Time (Minutes): 30, Frequency (Times/Week): 3, Weekly Exercise (Minutes/Week): 90, Intensity: Mild   Goals Addressed             This Visit's Progress    Remain active and independent        Depression Screen    09/28/2022    9:20 AM 07/26/2022    9:57 AM 04/26/2022    2:40 PM 11/04/2021    8:42 AM 05/06/2021    8:45 AM 03/11/2021    3:32 PM 10/31/2019  10:50 AM  PHQ 2/9 Scores  PHQ - 2 Score 0 0 0 0 0 0 0  PHQ- 9 Score  0 0 0 0  0    Fall Risk    09/28/2022    9:22 AM 07/26/2022    9:56 AM 04/26/2022    2:30 PM 11/04/2021    8:39 AM 05/06/2021    8:39 AM  Fall Risk   Falls in the past year? 0 0 0 0 0  Number falls in past yr: 0 0 0 0 0  Injury with Fall? 0 0 0 0 0  Risk for fall due to : No Fall Risks  No Fall Risks    Follow up Falls prevention discussed;Education provided;Falls evaluation completed        FALL RISK PREVENTION PERTAINING TO THE HOME:  Any stairs in or around the home? No  If so, are there any without handrails? No  Home free of loose throw rugs in walkways, pet beds, electrical cords, etc? Yes  Adequate lighting in your home to reduce risk of falls? Yes   ASSISTIVE DEVICES UTILIZED TO PREVENT FALLS:  Life alert? No  Use of a cane, walker or w/c? No  Grab bars in the bathroom? Yes  Shower chair or bench in shower? No  Elevated toilet seat or a handicapped toilet? Yes   TIMED UP AND GO:  Was the test performed? No . Telephonic visit   Cognitive Function:        09/28/2022    9:22 AM  6CIT Screen  What Year? 0 points  What month? 0 points  What time? 0 points  Count back from 20 0 points  Months in reverse 0 points  Repeat phrase 0 points  Total Score 0 points    Immunizations Immunization History  Administered Date(s) Administered   Influenza,inj,Quad  PF,6+ Mos 06/03/2016, 06/15/2017, 01/11/2018, 02/06/2019, 05/15/2020, 03/11/2021   Moderna Covid-19 Vaccine Bivalent Booster 73yrs & up 01/06/2022   PFIZER(Purple Top)SARS-COV-2 Vaccination 07/03/2019, 07/24/2019, 02/12/2020, 09/09/2020   Pfizer Covid-19 Vaccine Bivalent Booster 56yrs & up 05/29/2022   Pneumococcal Conjugate-13 03/13/2019   Pneumococcal Polysaccharide-23 05/15/2020   Zoster Recombinat (Shingrix) 01/07/2021, 03/11/2021    TDAP status: Due, Education has been provided regarding the importance of this vaccine. Advised may receive this vaccine at local pharmacy or Health Dept. Aware to provide a copy of the vaccination record if obtained from local pharmacy or Health Dept. Verbalized acceptance and understanding.  Pneumococcal vaccine status: Up to date  Covid-19 vaccine status: Information provided on how to obtain vaccines.   Qualifies for Shingles Vaccine? Yes   Zostavax completed No   Shingrix Completed?: Yes  Screening Tests Health Maintenance  Topic Date Due   COVID-19 Vaccine (7 - 2023-24 season) 07/24/2022   INFLUENZA VACCINE  12/09/2022   Medicare Annual Wellness (AWV)  09/28/2023   MAMMOGRAM  01/01/2024   COLONOSCOPY (Pts 45-48yrs Insurance coverage will need to be confirmed)  11/29/2024   Pneumonia Vaccine 96+ Years old  Completed   DEXA SCAN  Completed   Hepatitis C Screening  Completed   Zoster Vaccines- Shingrix  Completed   HPV VACCINES  Aged Out   DTaP/Tdap/Td  Discontinued    Health Maintenance  Health Maintenance Due  Topic Date Due   COVID-19 Vaccine (7 - 2023-24 season) 07/24/2022    Colorectal cancer screening: Type of screening: Colonoscopy. Completed 11/30/19. Repeat every 5 years  Mammogram status: Completed 12/31/21. Repeat every year (ordered today)  Bone Density  status: Completed 07/06/19. Results reflect: Bone density results: NORMAL. Repeat every 5 years.  Lung Cancer Screening: (Low Dose CT Chest recommended if Age 36-80 years,  30 pack-year currently smoking OR have quit w/in 15years.) does not qualify.   Lung Cancer Screening Referral: n/a  Additional Screening:  Hepatitis C Screening: does qualify; Completed 05/17/16  Vision Screening: Recommended annual ophthalmology exams for early detection of glaucoma and other disorders of the eye. Is the patient up to date with their annual eye exam?  Yes  Who is the provider or what is the name of the office in which the patient attends annual eye exams? Dr.Groat  If pt is not established with a provider, would they like to be referred to a provider to establish care? No .   Dental Screening: Recommended annual dental exams for proper oral hygiene  Community Resource Referral / Chronic Care Management: CRR required this visit?  No   CCM required this visit?  No      Plan:     I have personally reviewed and noted the following in the patient's chart:   Medical and social history Use of alcohol, tobacco or illicit drugs  Current medications and supplements including opioid prescriptions. Patient is not currently taking opioid prescriptions. Functional ability and status Nutritional status Physical activity Advanced directives List of other physicians Hospitalizations, surgeries, and ER visits in previous 12 months Vitals Screenings to include cognitive, depression, and falls Referrals and appointments  In addition, I have reviewed and discussed with patient certain preventive protocols, quality metrics, and best practice recommendations. A written personalized care plan for preventive services as well as general preventive health recommendations were provided to patient.     Durwin Nora, [place]   1/61/0960   Due to this being a virtual visit, the after visit summary with patients personalized plan was offered to patient via mail or my-chart.  per request, patient was mailed a copy of AVS.  Nurse Notes: No concerns

## 2022-09-28 NOTE — Patient Instructions (Addendum)
[name] , Thank you for taking time to come for your Medicare Wellness Visit. I appreciate your ongoing commitment to your health goals. Please review the following plan we discussed and let me know if I can assist you in the future.   These are the goals we discussed:  Goals      Remain active and independent        This is a list of the screening recommended for you and due dates:  Health Maintenance  Topic Date Due   COVID-19 Vaccine (7 - 2023-24 season) 07/24/2022   Flu Shot  12/09/2022   Medicare Annual Wellness Visit  09/28/2023   Mammogram  01/01/2024   Colon Cancer Screening  11/29/2024   Pneumonia Vaccine  Completed   DEXA scan (bone density measurement)  Completed   Hepatitis C Screening: USPSTF Recommendation to screen - Ages 96-79 yo.  Completed   Zoster (Shingles) Vaccine  Completed   HPV Vaccine  Aged Out   DTaP/Tdap/Td vaccine  Discontinued    Advanced directives: Information on Advanced Care Planning can be found at [facility] Advance Health Care Directives Advance Health Care Directives ([email or web address]    Conditions/risks identified: Aim for 30 minutes of exercise or brisk walking, 6-8 glasses of water, and 5 servings of fruits and vegetables each day.  Next appointment: Follow up in one year for your annual wellness visit   The number to schedule your mammogram at [facility] is [phone]   Preventive Care 65 Years and Older, Female Preventive care refers to lifestyle choices and visits with your health care provider that can promote health and wellness. What does preventive care include? A yearly physical exam. This is also called an annual well check. Dental exams once or twice a year. Routine eye exams. Ask your health care provider how often you should have your eyes checked. Personal lifestyle choices, including: Daily care of your teeth and gums. Regular physical activity. Eating a healthy diet. Avoiding tobacco and  drug use. Limiting alcohol use. Practicing safe sex. Taking low-dose aspirin every day. Taking vitamin and mineral supplements as recommended by your health care provider. What happens during an annual well check? The services and screenings done by your health care provider during your annual well check will depend on your age, overall health, lifestyle risk factors, and family history of disease. Counseling  Your health care provider may ask you questions about your: Alcohol use. Tobacco use. Drug use. Emotional well-being. Home and relationship well-being. Sexual activity. Eating habits. History of falls. Memory and ability to understand (cognition). Work and work Astronomer. Reproductive health. Screening  You may have the following tests or measurements: Height, weight, and BMI. Blood pressure. Lipid and cholesterol levels. These may be checked every 5 years, or more frequently if you are over 83 years old. Skin check. Lung cancer screening. You may have this screening every year starting at age 44 if you have a 30-pack-year history of smoking and currently smoke or have quit within the past 15 years. Fecal occult blood test (FOBT) of the stool. You may have this test every year starting at age 56. Flexible sigmoidoscopy or colonoscopy. You may have a sigmoidoscopy every 5 years or a colonoscopy every 10 years starting at age 86. Hepatitis C blood test. Hepatitis B blood test. Sexually transmitted disease (STD) testing. Diabetes screening. This is done by checking your blood sugar (glucose) after you have not eaten for a while (fasting). You may have  this done every 1-3 years. Bone density scan. This is done to screen for osteoporosis. You may have this done starting at age 19. Mammogram. This may be done every 1-2 years. Talk to your health care provider about how often you should have regular mammograms. Talk with your health care provider about your test results, treatment  options, and if necessary, the need for more tests. Vaccines  Your health care provider may recommend certain vaccines, such as: Influenza vaccine. This is recommended every year. Tetanus, diphtheria, and acellular pertussis (Tdap, Td) vaccine. You may need a Td booster every 10 years. Zoster vaccine. You may need this after age 71. Pneumococcal 13-valent conjugate (PCV13) vaccine. One dose is recommended after age 42. Pneumococcal polysaccharide (PPSV23) vaccine. One dose is recommended after age 59. Talk to your health care provider about which screenings and vaccines you need and how often you need them. This information is not intended to replace advice given to you by your health care provider. Make sure you discuss any questions you have with your health care provider. Document Released: 05/23/2015 Document Revised: 01/14/2016 Document Reviewed: 02/25/2015 Elsevier Interactive Patient Education  2017 [employer].  Fall Prevention in the Home Falls can cause injuries. They can happen to people of all ages. There are many things you can do to make your home safe and to help prevent falls. What can I do on the outside of my home? Regularly fix the edges of walkways and driveways and fix any cracks. Remove anything that might make you trip as you walk through a door, such as a raised step or threshold. Trim any bushes or trees on the path to your home. Use bright outdoor lighting. Clear any walking paths of anything that might make someone trip, such as rocks or tools. Regularly check to see if handrails are loose or broken. Make sure that both sides of any steps have handrails. Any raised decks and porches should have guardrails on the edges. Have any leaves, snow, or ice cleared regularly. Use sand or salt on walking paths during winter. Clean up any spills in your garage right away. This includes oil or grease spills. What can I do in the bathroom? Use night lights. Install grab  bars by the toilet and in the tub and shower. Do not use towel bars as grab bars. Use non-skid mats or decals in the tub or shower. If you need to sit down in the shower, use a plastic, non-slip stool. Keep the floor dry. Clean up any water that spills on the floor as soon as it happens. Remove soap buildup in the tub or shower regularly. Attach bath mats securely with double-sided non-slip rug tape. Do not have throw rugs and other things on the floor that can make you trip. What can I do in the bedroom? Use night lights. Make sure that you have a light by your bed that is easy to reach. Do not use any sheets or blankets that are too big for your bed. They should not hang down onto the floor. Have a firm chair that has side arms. You can use this for support while you get dressed. Do not have throw rugs and other things on the floor that can make you trip. What can I do in the kitchen? Clean up any spills right away. Avoid walking on wet floors. Keep items that you use a lot in easy-to-reach places. If you need to reach something above you, use a strong step stool  that has a grab bar. Keep electrical cords out of the way. Do not use floor polish or wax that makes floors slippery. If you must use wax, use non-skid floor wax. Do not have throw rugs and other things on the floor that can make you trip. What can I do with my stairs? Do not leave any items on the stairs. Make sure that there are handrails on both sides of the stairs and use them. Fix handrails that are broken or loose. Make sure that handrails are as long as the stairways. Check any carpeting to make sure that it is firmly attached to the stairs. Fix any carpet that is loose or worn. Avoid having throw rugs at the top or bottom of the stairs. If you do have throw rugs, attach them to the floor with carpet tape. Make sure that you have a light switch at the top of the stairs and the bottom of the stairs. If you do not have them,  ask someone to add them for you. What else can I do to help prevent falls? Wear shoes that: Do not have high heels. Have rubber bottoms. Are comfortable and fit you well. Are closed at the toe. Do not wear sandals. If you use a stepladder: Make sure that it is fully opened. Do not climb a closed stepladder. Make sure that both sides of the stepladder are locked into place. Ask someone to hold it for you, if possible. Clearly mark and make sure that you can see: Any grab bars or handrails. First and last steps. Where the edge of each step is. Use tools that help you move around (mobility aids) if they are needed. These include: Canes. Walkers. Scooters. Crutches. Turn on the lights when you go into a dark area. Replace any light bulbs as soon as they burn out. Set up your furniture so you have a clear path. Avoid moving your furniture around. If any of your floors are uneven, fix them. If there are any pets around you, be aware of where they are. Review your medicines with your doctor. Some medicines can make you feel dizzy. This can increase your chance of falling. Ask your doctor what other things that you can do to help prevent falls. This information is not intended to replace advice given to you by your health care provider. Make sure you discuss any questions you have with your health care provider. Document Released: 02/20/2009 Document Revised: 10/02/2015 Document Reviewed: 05/31/2014 Elsevier Interactive Patient Education  2017 [employer].

## 2022-09-29 ENCOUNTER — Other Ambulatory Visit: Payer: Self-pay

## 2022-10-01 ENCOUNTER — Other Ambulatory Visit: Payer: Self-pay

## 2022-10-14 ENCOUNTER — Other Ambulatory Visit: Payer: Self-pay

## 2022-10-15 ENCOUNTER — Other Ambulatory Visit: Payer: Self-pay

## 2022-11-15 ENCOUNTER — Other Ambulatory Visit: Payer: Self-pay

## 2022-11-19 ENCOUNTER — Other Ambulatory Visit: Payer: Self-pay

## 2022-12-20 ENCOUNTER — Other Ambulatory Visit: Payer: Self-pay

## 2022-12-21 ENCOUNTER — Other Ambulatory Visit: Payer: Self-pay

## 2023-01-03 ENCOUNTER — Other Ambulatory Visit: Payer: Self-pay

## 2023-01-03 ENCOUNTER — Other Ambulatory Visit (HOSPITAL_COMMUNITY): Payer: Self-pay

## 2023-01-04 ENCOUNTER — Other Ambulatory Visit: Payer: Self-pay

## 2023-01-04 ENCOUNTER — Ambulatory Visit
Admission: RE | Admit: 2023-01-04 | Discharge: 2023-01-04 | Disposition: A | Discharge status: Home / Self Care | Payer: Medicare Other | Source: Ambulatory Visit | Attending: Family Medicine | Admitting: Family Medicine

## 2023-01-04 DIAGNOSIS — Z1231 Encounter for screening mammogram for malignant neoplasm of breast: Secondary | ICD-10-CM | POA: Diagnosis not present

## 2023-01-09 ENCOUNTER — Emergency Department (HOSPITAL_COMMUNITY)
Admission: EM | Admit: 2023-01-09 | Discharge: 2023-01-09 | Disposition: A | Discharge status: Home / Self Care | Payer: Medicare Other | Attending: Emergency Medicine | Admitting: Emergency Medicine

## 2023-01-09 ENCOUNTER — Other Ambulatory Visit: Payer: Self-pay

## 2023-01-09 ENCOUNTER — Encounter (HOSPITAL_COMMUNITY): Payer: Self-pay | Admitting: *Deleted

## 2023-01-09 DIAGNOSIS — R42 Dizziness and giddiness: Secondary | ICD-10-CM | POA: Diagnosis not present

## 2023-01-09 DIAGNOSIS — L659 Nonscarring hair loss, unspecified: Secondary | ICD-10-CM | POA: Diagnosis not present

## 2023-01-09 DIAGNOSIS — Z7982 Long term (current) use of aspirin: Secondary | ICD-10-CM | POA: Insufficient documentation

## 2023-01-09 DIAGNOSIS — I10 Essential (primary) hypertension: Secondary | ICD-10-CM | POA: Insufficient documentation

## 2023-01-09 DIAGNOSIS — Z79899 Other long term (current) drug therapy: Secondary | ICD-10-CM | POA: Insufficient documentation

## 2023-01-09 LAB — CBC
HCT: 43.7 % (ref 36.0–46.0)
Hemoglobin: 13.6 g/dL (ref 12.0–15.0)
MCH: 23.2 pg — ABNORMAL LOW (ref 26.0–34.0)
MCHC: 31.1 g/dL (ref 30.0–36.0)
MCV: 74.7 fL — ABNORMAL LOW (ref 80.0–100.0)
Platelets: 226 10*3/uL (ref 150–400)
RBC: 5.85 MIL/uL — ABNORMAL HIGH (ref 3.87–5.11)
RDW: 16.2 % — ABNORMAL HIGH (ref 11.5–15.5)
WBC: 4.7 10*3/uL (ref 4.0–10.5)
nRBC: 0 % (ref 0.0–0.2)

## 2023-01-09 LAB — URINALYSIS, ROUTINE W REFLEX MICROSCOPIC
Bilirubin Urine: NEGATIVE
Glucose, UA: NEGATIVE mg/dL
Hgb urine dipstick: NEGATIVE
Ketones, ur: NEGATIVE mg/dL
Leukocytes,Ua: NEGATIVE
Nitrite: NEGATIVE
Protein, ur: NEGATIVE mg/dL
Specific Gravity, Urine: 1.019 (ref 1.005–1.030)
pH: 5 (ref 5.0–8.0)

## 2023-01-09 LAB — COMPREHENSIVE METABOLIC PANEL
ALT: 20 U/L (ref 0–44)
AST: 21 U/L (ref 15–41)
Albumin: 3.7 g/dL (ref 3.5–5.0)
Alkaline Phosphatase: 76 U/L (ref 38–126)
Anion gap: 11 (ref 5–15)
BUN: 13 mg/dL (ref 8–23)
CO2: 18 mmol/L — ABNORMAL LOW (ref 22–32)
Calcium: 9.2 mg/dL (ref 8.9–10.3)
Chloride: 107 mmol/L (ref 98–111)
Creatinine, Ser: 0.92 mg/dL (ref 0.44–1.00)
GFR, Estimated: >60 mL/min (ref >60)
Glucose, Bld: 112 mg/dL — ABNORMAL HIGH (ref 70–99)
Potassium: 4 mmol/L (ref 3.5–5.1)
Sodium: 136 mmol/L (ref 135–145)
Total Bilirubin: 0.5 mg/dL (ref 0.3–1.2)
Total Protein: 6.7 g/dL (ref 6.5–8.1)

## 2023-01-09 LAB — TSH: TSH: 1.943 u[IU]/mL (ref 0.350–4.500)

## 2023-01-09 NOTE — ED Triage Notes (Signed)
The pt is c/o dizziness and she has swelling in her rt face and jaw for 1-2 weeks and she has an area to the top of her head itching area on her head

## 2023-01-09 NOTE — ED Provider Notes (Signed)
[place] EMERGENC[facility] DEPARTMENT AT [facility] Provider Note   CSN: [id number] Arrival date & time: 01/09/23  0103     History  Chief Complaint  Patient presents with   Dizziness    Fritzi Grinde is a 72 y.o. female.  HPI     This is a 72 year old female who presents with dizziness and hair loss.  Patient reports over the last week she just has not felt like herself.  She states she has had to flush her left ear multiple times because she got a bug and it.  She subsequently has experienced some lightheadedness and dizziness.  She states it comes and goes.  Also she has noted hair loss over the top of her head.  She states that her head is itching.  She did buy a new wig about a month ago and is wondering whether she got an infection from the new wig.  No one else has had similar symptoms in her family.  She has not had any fevers.  No systemic symptoms.  She does have a history of thyroid disorder but is not currently on thyroid medication.  Home Medications Prior to Admission medications   Medication Sig Start Date End Date Taking? Authorizing Provider  albuterol (PROAIR HFA) 108 (90 Base) MCG/ACT inhaler Inhale 2 puffs into the lungs every 6 (six) hours as needed. 09/20/22   Hoy Register, MD  aspirin EC 81 MG tablet Take 81 mg by mouth daily.    [provider]  ezetimibe (ZETIA) 10 MG tablet Take 1 tablet (10 mg total) by mouth daily. 07/26/22   Hoy Register, MD  hydrochlorothiazide (HYDRODIURIL) 25 MG tablet Take 0.5 tablets (12.5 mg total) by mouth every other day.(Keep upcoming appointment for further refills) 07/26/22   Hoy Register, MD  linaclotide (LINZESS) 145 MCG CAPS capsule TAKE 1 CAPSULE BY MOUTH AT LEAST 30 MINUTES BEFORE THE FIRST MEAL OF THE DAY ON AN EMPTY STOMACH (must make office visit for further refills 07/26/22   Hoy Register, MD  metoprolol succinate (TOPROL-XL) 50 MG 24 hr tablet Take 1 tablet (50 mg total) by mouth daily. 07/26/22    Hoy Register, MD      Allergies    Patient has no known allergies.    Review of Systems   Review of Systems  Constitutional:  Negative for fever.  Respiratory:  Negative for shortness of breath.   Cardiovascular:  Negative for chest pain.  Neurological:  Positive for dizziness and light-headedness.  All other systems reviewed and are negative.   Physical Exam Updated Vital Signs BP (!) 151/94   Pulse 74   Temp 98.7 F (37.1 C) (Oral)   Resp 18   Ht 1.676 m (5\' 6" )   Wt 94.3 kg   SpO2 100%   BMI 33.55 kg/m  Physical Exam Vitals and nursing note reviewed.  Constitutional:      Appearance: She is well-developed. She is obese. She is not ill-appearing.  HENT:     Head: Normocephalic.     Comments: Patient with circular area of hair thinning over the crown, no nits noted, no obvious support dermatitis or dermatologic lesions Eyes:     Pupils: Pupils are equal, round, and reactive to light.  Cardiovascular:     Rate and Rhythm: Normal rate and regular rhythm.     Heart sounds: Normal heart sounds.  Pulmonary:     Effort: Pulmonary effort is normal. No respiratory distress.     Breath sounds: No  wheezing.  Abdominal:     General: Bowel sounds are normal.     Palpations: Abdomen is soft.     Tenderness: There is no guarding or rebound.  Musculoskeletal:     Cervical back: Neck supple.  Skin:    General: Skin is warm and dry.  Neurological:     Mental Status: She is alert and oriented to person, place, and time.     Comments: Cranial nerves II through XII intact, 5 out of 5 strength in all 4 extremities, no dysmetria to finger-nose-finger  Psychiatric:        Mood and Affect: Mood normal.     ED Results / Procedures / Treatments   Labs (all labs ordered are listed, but only abnormal results are displayed) Labs Reviewed  COMPREHENSIVE METABOLIC PANEL - Abnormal; Notable for the following components:      Result Value   CO2 18 (*)    Glucose, Bld 112 (*)     All other components within normal limits  CBC - Abnormal; Notable for the following components:   RBC 5.85 (*)    MCV 74.7 (*)    MCH 23.2 (*)    RDW 16.2 (*)    All other components within normal limits  URINALYSIS, ROUTINE W REFLEX MICROSCOPIC  TSH    EKG EKG Interpretation Date/Time:  Sunday January 09 2023 02:08:14 EDT Ventricular Rate:  76 PR Interval:  140 QRS Duration:  136 QT Interval:  396 QTC Calculation: 445 R Axis:   -31  Text Interpretation: Sinus rhythm with occasional Premature ventricular complexes and Premature atrial complexes Left axis deviation Right bundle branch block Abnormal ECG When compared with ECG of 13-Aug-2020 18:01, PREVIOUS ECG IS PRESENT No significant change since last tracing Confirmed by Ross Marcus ([id number]) on 01/09/2023 5:46:47 AM  Radiology No results found.  Procedures Procedures    Medications Ordered in ED Medications - No data to display  ED Course/ Medical Decision Making/ A&P                                 Medical Decision Making Amount and/or Complexity of Data Reviewed Labs: ordered.   This patient presents to the ED for concern of dizziness, hair loss, this involves an extensive number of treatment options, and is a complaint that carries with it a high risk of complications and morbidity.  I considered the following differential and admission for this acute, potentially life threatening condition.  The differential diagnosis includes orthostasis, vertigo, arrhythmia, systemic problems such as thyroid disorder  MDM:    This is a 72 year old female who presents with several complaints.  She is overall nontoxic and vital signs are reassuring.  Regarding her dizziness, she has no focal deficits on exam.  She is currently asymptomatic.  Question whether she may be inducing dizziness because she has been flushing her ear frequently.  No signs of central vertigo.  EKG shows no evidence of acute arrhythmia or ischemia.  She  is not orthostatic.  Regarding her hair loss, she has some hair thinning but no true alopecia.  No obvious scalp lesions.  Basic lab work shows no significant metabolic derangements.  TSH is normal.  Unclear etiology.  Will refer back to primary physician.  (Labs, imaging, consults)  Labs: I Ordered, and personally interpreted labs.  The pertinent results include: CBC, BMP, TSH  Imaging Studies ordered: I ordered imaging studies including none I independently  visualized and interpreted imaging. I agree with the radiologist interpretation  Additional history obtained from chart review.  External records from outside source obtained and reviewed including prior evaluations  Cardiac Monitoring: The patient was maintained on a cardiac monitor.  If on the cardiac monitor, I personally viewed and interpreted the cardiac monitored which showed an underlying rhythm of: Sinus rhythm  Reevaluation: After the interventions noted above, I reevaluated the patient and found that they have :improved  Social Determinants of Health:  lives independently  Disposition: Discharge  Co morbidities that complicate the patient evaluation  Past Medical History:  Diagnosis Date   Atrial fibrillation (HCC)    Atrial fibrillation (HCC)    she had ablation which resolved intermittent afib   Hypertension    Obesity    Seasonal allergies      Medicines No orders of the defined types were placed in this encounter.   I have reviewed the patients home medicines and have made adjustments as needed  Problem List / ED Course: Problem List Items Addressed This Visit   None Visit Diagnoses     Lightheadedness    -  Primary   Hair loss                       Final Clinical Impression(s) / ED Diagnoses Final diagnoses:  Lightheadedness  Hair loss    Rx / DC Orders ED Discharge Orders     None         Shon Baton, MD 01/09/23 [phone]

## 2023-01-09 NOTE — Discharge Instructions (Signed)
Are seen today for several symptoms including dizziness and hair loss.  Your workup today including thyroid studies is reassuring.  Follow-up with your primary doctor to get referral for dermatology if you continue to have hair loss.  Your dizziness is likely related to some vertigo induced by flushing your ear.  Sure that you are staying hydrated.

## 2023-01-11 ENCOUNTER — Other Ambulatory Visit: Payer: Self-pay

## 2023-01-14 ENCOUNTER — Other Ambulatory Visit: Payer: Self-pay

## 2023-01-17 ENCOUNTER — Other Ambulatory Visit: Payer: Self-pay

## 2023-01-31 ENCOUNTER — Ambulatory Visit: Payer: Medicare Other | Attending: Family Medicine | Admitting: Family Medicine

## 2023-01-31 ENCOUNTER — Other Ambulatory Visit: Payer: Self-pay

## 2023-01-31 ENCOUNTER — Encounter: Payer: Self-pay | Admitting: Family Medicine

## 2023-01-31 VITALS — BP 134/79 | HR 86 | Ht 66.0 in | Wt 215.4 lb

## 2023-01-31 DIAGNOSIS — E0590 Thyrotoxicosis, unspecified without thyrotoxic crisis or storm: Secondary | ICD-10-CM | POA: Insufficient documentation

## 2023-01-31 DIAGNOSIS — K5900 Constipation, unspecified: Secondary | ICD-10-CM | POA: Insufficient documentation

## 2023-01-31 DIAGNOSIS — R7303 Prediabetes: Secondary | ICD-10-CM | POA: Diagnosis not present

## 2023-01-31 DIAGNOSIS — K5909 Other constipation: Secondary | ICD-10-CM | POA: Diagnosis not present

## 2023-01-31 DIAGNOSIS — Z23 Encounter for immunization: Secondary | ICD-10-CM | POA: Diagnosis not present

## 2023-01-31 DIAGNOSIS — E7800 Pure hypercholesterolemia, unspecified: Secondary | ICD-10-CM

## 2023-01-31 DIAGNOSIS — L659 Nonscarring hair loss, unspecified: Secondary | ICD-10-CM | POA: Diagnosis not present

## 2023-01-31 DIAGNOSIS — Z79899 Other long term (current) drug therapy: Secondary | ICD-10-CM | POA: Insufficient documentation

## 2023-01-31 DIAGNOSIS — L299 Pruritus, unspecified: Secondary | ICD-10-CM | POA: Diagnosis not present

## 2023-01-31 DIAGNOSIS — E785 Hyperlipidemia, unspecified: Secondary | ICD-10-CM | POA: Insufficient documentation

## 2023-01-31 DIAGNOSIS — R590 Localized enlarged lymph nodes: Secondary | ICD-10-CM | POA: Diagnosis not present

## 2023-01-31 DIAGNOSIS — R591 Generalized enlarged lymph nodes: Secondary | ICD-10-CM | POA: Diagnosis not present

## 2023-01-31 DIAGNOSIS — Z8679 Personal history of other diseases of the circulatory system: Secondary | ICD-10-CM | POA: Insufficient documentation

## 2023-01-31 DIAGNOSIS — I10 Essential (primary) hypertension: Secondary | ICD-10-CM | POA: Insufficient documentation

## 2023-01-31 MED ORDER — METOPROLOL SUCCINATE ER 50 MG PO TB24
50.0000 mg | ORAL_TABLET | Freq: Every day | ORAL | 1 refills | Status: DC
  Filled 2023-01-31 – 2023-04-11 (×2): qty 90, 90d supply, fill #0
  Filled 2023-07-07: qty 90, 90d supply, fill #1

## 2023-01-31 MED ORDER — KETOCONAZOLE 2 % EX SHAM
1.0000 | MEDICATED_SHAMPOO | CUTANEOUS | 0 refills | Status: DC
  Filled 2023-01-31: qty 120, 30d supply, fill #0

## 2023-01-31 MED ORDER — EZETIMIBE 10 MG PO TABS
10.0000 mg | ORAL_TABLET | Freq: Every day | ORAL | 1 refills | Status: DC
  Filled 2023-01-31 – 2023-03-18 (×2): qty 90, 90d supply, fill #0
  Filled 2023-06-17: qty 90, 90d supply, fill #1

## 2023-01-31 MED ORDER — HYDROCHLOROTHIAZIDE 25 MG PO TABS
12.5000 mg | ORAL_TABLET | ORAL | 1 refills | Status: DC
  Filled 2023-01-31: qty 45, 180d supply, fill #0
  Filled 2023-03-18: qty 22, 88d supply, fill #0
  Filled 2023-06-17: qty 22, 88d supply, fill #1
  Filled 2023-09-13: qty 22, 88d supply, fill #2
  Filled 2023-12-12: qty 22, 88d supply, fill #3

## 2023-01-31 NOTE — Patient Instructions (Signed)

## 2023-01-31 NOTE — Progress Notes (Signed)
Subjective:  Patient ID: Kayla Preston, female    DOB: [birth year]  Age: 72 y.o. MRN: [id number]  CC: Medical Management of Chronic Issues (Hair loss/Swelling in neck area)   HPI Kayla Preston is a 72 y.o. year old female with a history of prediabetes (A1c 6.0), hypertension, hyperthyroidism (not on medication) previous history of SVT   Interval History: Discussed the use of AI scribe software for clinical note transcription with the patient, who gave verbal consent to proceed.  The chief complaint is hair loss and severe itching of the scalp.  She did have a patch of hair loss but this has grown.  She denies seeing hair on her pillow in her colon.  The patient reports that the itching has been persistent for over three weeks and has been using tree oil to alleviate the symptoms. She also tried using Selsun Blue shampoo but discontinued it due to ineffectiveness and discoloration of the hair. The patient also reports a history of wearing synthetic wigs, which she suspects might have contributed to the scalp issues.  She is using Tea tree oil which relieves the itching momentarily but then itching returns.  In addition to the scalp issues, the patient reports swelling of the lymph nodes on both sides of the neck. The swelling has been present for a while but is now decreasing in size.  She endorses having sinus symptoms which have resolved. The patient also has a history of prediabetes and hyperlipidemia. She is currently on medication (Ezetimibe) for cholesterol management.  Her last lipid panel was elevated and she was on atorvastatin in the past but states it did cause some GI symptoms and she discontinued it.  The patient also has a history of constipation, which is currently managed with Linzess, but she has found relief with a certain type of tea.  She is currently doing well on her antihypertensive.       Past Medical History:  Diagnosis Date   Atrial fibrillation (HCC)    Atrial  fibrillation (HCC)    she had ablation which resolved intermittent afib   Hypertension    Obesity    Seasonal allergies     Past Surgical History:  Procedure Laterality Date   ABLATION      Family History  Problem Relation Age of Onset   Heart disease Mother    Heart disease Father    Hyperlipidemia Sister    Thyroid disease Neg Hx     Social History   Socioeconomic History   Marital status: Single    Spouse name: Not on file   Number of children: Not on file   Years of education: Not on file   Highest education level: Not on file  Occupational History   Not on file  Tobacco Use   Smoking status: Never   Smokeless tobacco: Never  Substance and Sexual Activity   Alcohol use: No   Drug use: No   Sexual activity: Not Currently  Other Topics Concern   Not on file  Social History Narrative   ** Merged History Encounter **       Social Determinants of Health   Financial Resource Strain: Low Risk  (09/28/2022)   Overall Financial Resource Strain (CARDIA)    Difficulty of Paying Living Expenses: Not hard at all  Food Insecurity: No Food Insecurity (09/28/2022)   Hunger Vital Sign    Worried About Running Out of Food in the Last Year: Never true    Ran Out of Food  in the Last Year: Never true  Transportation Needs: No Transportation Needs (09/28/2022)   PRAPARE - Administrator, Civil Service (Medical): No    Lack of Transportation (Non-Medical): No  Physical Activity: Insufficiently Active (09/28/2022)   Exercise Vital Sign    Days of Exercise per Week: 3 days    Minutes of Exercise per Session: 30 min  Stress: No Stress Concern Present (09/28/2022)   [employer] of Occupational Health - Occupational Stress Questionnaire    Feeling of Stress : Not at all  Social Connections: Unknown (09/28/2022)   Social Connection and Isolation Panel [NHANES]    Frequency of Communication with Friends and Family: More than three times a week    Frequency of Social  Gatherings with Friends and Family: Three times a week    Attends Religious Services: More than 4 times per year    Active Member of Clubs or Organizations: No    Attends Banker Meetings: Never    Marital Status: Patient declined    No Known Allergies  Outpatient Medications Prior to Visit  Medication Sig Dispense Refill   albuterol (PROAIR HFA) 108 (90 Base) MCG/ACT inhaler Inhale 2 puffs into the lungs every 6 (six) hours as needed. 6.7 g 1   aspirin EC 81 MG tablet Take 81 mg by mouth daily.     linaclotide (LINZESS) 145 MCG CAPS capsule TAKE 1 CAPSULE BY MOUTH AT LEAST 30 MINUTES BEFORE THE FIRST MEAL OF THE DAY ON AN EMPTY STOMACH (must make office visit for further refills 90 capsule 1   ezetimibe (ZETIA) 10 MG tablet Take 1 tablet (10 mg total) by mouth daily. 90 tablet 1   hydrochlorothiazide (HYDRODIURIL) 25 MG tablet Take 0.5 tablets (12.5 mg total) by mouth every other day.(Keep upcoming appointment for further refills) 45 tablet 1   metoprolol succinate (TOPROL-XL) 50 MG 24 hr tablet Take 1 tablet (50 mg total) by mouth daily. 90 tablet 1   No facility-administered medications prior to visit.     ROS Review of Systems  Constitutional:  Negative for activity change and appetite change.  HENT:  Negative for sinus pressure and sore throat.   Respiratory:  Negative for chest tightness, shortness of breath and wheezing.   Cardiovascular:  Negative for chest pain and palpitations.  Gastrointestinal:  Negative for abdominal distention, abdominal pain and constipation.  Genitourinary: Negative.   Musculoskeletal: Negative.   Psychiatric/Behavioral:  Negative for behavioral problems and dysphoric mood.     Objective:  BP 134/79   Pulse 86   Ht 5\' 6"  (1.676 m)   Wt 215 lb 6.4 oz (97.7 kg)   SpO2 98%   BMI 34.77 kg/m      01/31/2023    8:43 AM 01/09/2023    6:45 AM 01/09/2023    6:32 AM  BP/Weight  Systolic BP 134 153 151  Diastolic BP 79 89 94  Wt. (Lbs)  215.4    BMI 34.77 kg/m2        Physical Exam Constitutional:      Appearance: She is well-developed.  Neck:     Comments: Small bilateral lymphadenopathy, nontender Cardiovascular:     Rate and Rhythm: Normal rate.     Heart sounds: Normal heart sounds. No murmur heard. Pulmonary:     Effort: Pulmonary effort is normal.     Breath sounds: Normal breath sounds. No wheezing or rales.  Chest:     Chest wall: No tenderness.  Abdominal:  General: Bowel sounds are normal. There is no distension.     Palpations: Abdomen is soft. There is no mass.     Tenderness: There is no abdominal tenderness.  Musculoskeletal:        General: Normal range of motion.     Right lower leg: No edema.     Left lower leg: No edema.  Skin:    Comments: Area of short hair in central aspect of head.  No patch of hair loss  Neurological:     Mental Status: She is alert and oriented to person, place, and time.  Psychiatric:        Mood and Affect: Mood normal.        Latest Ref Rng & Units 01/09/2023    2:03 AM 07/26/2022   10:16 AM 04/26/2022    3:17 PM  CMP  Glucose 70 - 99 mg/dL 244  010  83   BUN 8 - 23 mg/dL 13  11  11    Creatinine 0.44 - 1.00 mg/dL 2.72  5.36  6.44   Sodium 135 - 145 mmol/L 136  141  141   Potassium 3.5 - 5.1 mmol/L 4.0  4.1  3.8   Chloride 98 - 111 mmol/L 107  108  105   CO2 22 - 32 mmol/L 18  19  21    Calcium 8.9 - 10.3 mg/dL 9.2  9.8  9.5   Total Protein 6.5 - 8.1 g/dL 6.7   6.5   Total Bilirubin 0.3 - 1.2 mg/dL 0.5   0.3   Alkaline Phos 38 - 126 U/L 76   86   AST 15 - 41 U/L 21   16   ALT 0 - 44 U/L 20   21     Lipid Panel     Component Value Date/Time   CHOL 210 (H) 09/25/2021 0845   TRIG 75 09/25/2021 0845   HDL 65 09/25/2021 0845   CHOLHDL 3.2 09/25/2021 0845   CHOLHDL 3.4 05/17/2016 1004   VLDL 18 05/17/2016 1004   LDLCALC 132 (H) 09/25/2021 0845    CBC    Component Value Date/Time   WBC 4.7 01/09/2023 0203   RBC 5.85 (H) 01/09/2023 0203   HGB  13.6 01/09/2023 0203   HGB 13.2 09/25/2021 0845   HCT 43.7 01/09/2023 0203   HCT 42.3 09/25/2021 0845   PLT 226 01/09/2023 0203   PLT 250 09/25/2021 0845   MCV 74.7 (L) 01/09/2023 0203   MCV 75 (L) 09/25/2021 0845   MCH 23.2 (L) 01/09/2023 0203   MCHC 31.1 01/09/2023 0203   RDW 16.2 (H) 01/09/2023 0203   RDW 16.0 (H) 09/25/2021 0845   LYMPHSABS 1.7 09/25/2021 0845   MONOABS 0.8 08/13/2020 2128   EOSABS 0.1 09/25/2021 0845   BASOSABS 0.0 09/25/2021 0845    Lab Results  Component Value Date   HGBA1C 6.0 07/26/2022    Lab Results  Component Value Date   TSH 1.943 01/09/2023   The ASCVD Risk score (Arnett DK, et al., 2019) failed to calculate for the following reasons:   Unable to determine if patient is Non-Hispanic African American   Assessment & Plan:      Scalp Itching and Hair Loss Possible allergic reaction to synthetic wig or alopecia versus fungal etiology.   Tried Selsun Blue and tree oil with temporary relief. -Prescribe Nizoral shampoo to be used twice a week. -If no improvement, refer to dermatologist.  Submandibular Lymphadenopathy Likely secondary to recent infection. Nodes  are decreasing in size. -Continue to monitor for resolution.  Hyperlipidemia On Ezetimibe due to intolerance to statins. Last cholesterol level was high. -Check cholesterol level and calculate risk. -Consider alternative statin if level remains high. -It appears she tried atorvastatin in the past and could not tolerate it.  Prediabetes Last A1C was 6.0. -Check A1C level. -Continue to work on lifestyle modification to prevent progression to type 2 diabetes mellitus  Constipation Managed with tea, no longer requires Linzess. -Continue current management.  General Health Maintenance -Administer flu shot today. -Continue Metoprolol and Hydrochlorothiazide for hypertension. -Continue Ezetimibe for hyperlipidemia. -Communicate lab results via MyChart.          Meds ordered  this encounter  Medications   ezetimibe (ZETIA) 10 MG tablet    Sig: Take 1 tablet (10 mg total) by mouth daily.    Dispense:  90 tablet    Refill:  1   hydrochlorothiazide (HYDRODIURIL) 25 MG tablet    Sig: Take 0.5 tablets (12.5 mg total) by mouth every other day.(Keep upcoming appointment for further refills)    Dispense:  45 tablet    Refill:  1   metoprolol succinate (TOPROL-XL) 50 MG 24 hr tablet    Sig: Take 1 tablet (50 mg total) by mouth daily.    Dispense:  90 tablet    Refill:  1   ketoconazole (NIZORAL) 2 % shampoo    Sig: Apply 1 Application topically 2 (two) times a week.    Dispense:  120 mL    Refill:  0    Follow-up: Return in about 6 months (around 07/31/2023) for Chronic medical conditions.       Hoy Register, MD, FAAFP. [facility] [place], [place] [phone]   01/31/2023, 9:19 AM

## 2023-02-01 ENCOUNTER — Other Ambulatory Visit: Payer: Self-pay

## 2023-02-01 ENCOUNTER — Other Ambulatory Visit: Payer: Self-pay | Admitting: Family Medicine

## 2023-02-01 LAB — CMP14+EGFR
ALT: 15 IU/L (ref 0–32)
AST: 15 IU/L (ref 0–40)
Albumin: 4.2 g/dL (ref 3.9–4.9)
Alkaline Phosphatase: 97 IU/L (ref 44–121)
BUN/Creatinine Ratio: 13 (ref 12–28)
BUN: 11 mg/dL (ref 8–27)
Bilirubin Total: 0.3 mg/dL (ref 0.0–1.2)
CO2: 20 mmol/L (ref 20–29)
Calcium: 9.3 mg/dL (ref 8.7–10.3)
Chloride: 110 mmol/L — ABNORMAL HIGH (ref 96–106)
Creatinine, Ser: 0.84 mg/dL (ref 0.57–1.00)
Globulin, Total: 2.7 g/dL (ref 1.5–4.5)
Glucose: 89 mg/dL (ref 70–99)
Potassium: 4 mmol/L (ref 3.5–5.2)
Sodium: 142 mmol/L (ref 134–144)
Total Protein: 6.9 g/dL (ref 6.0–8.5)
eGFR: 75 mL/min/{1.73_m2} (ref >59)

## 2023-02-01 LAB — LP+NON-HDL CHOLESTEROL
Cholesterol, Total: 249 mg/dL — ABNORMAL HIGH (ref 100–199)
HDL: 65 mg/dL (ref >39)
LDL Chol Calc (NIH): 168 mg/dL — ABNORMAL HIGH (ref 0–99)
Total Non-HDL-Chol (LDL+VLDL): 184 mg/dL — ABNORMAL HIGH (ref 0–129)
Triglycerides: 94 mg/dL (ref 0–149)
VLDL Cholesterol Cal: 16 mg/dL (ref 5–40)

## 2023-02-01 LAB — HEMOGLOBIN A1C
Est. average glucose Bld gHb Est-mCnc: 134 mg/dL
Hgb A1c MFr Bld: 6.3 % — ABNORMAL HIGH (ref 4.8–5.6)

## 2023-02-01 MED ORDER — ROSUVASTATIN CALCIUM 5 MG PO TABS
5.0000 mg | ORAL_TABLET | Freq: Every day | ORAL | 3 refills | Status: DC
  Filled 2023-02-01: qty 30, 30d supply, fill #0

## 2023-02-02 ENCOUNTER — Other Ambulatory Visit: Payer: Self-pay

## 2023-02-02 ENCOUNTER — Other Ambulatory Visit (HOSPITAL_BASED_OUTPATIENT_CLINIC_OR_DEPARTMENT_OTHER): Payer: Self-pay

## 2023-02-07 ENCOUNTER — Other Ambulatory Visit: Payer: Self-pay

## 2023-03-01 ENCOUNTER — Other Ambulatory Visit: Payer: Self-pay

## 2023-03-01 ENCOUNTER — Ambulatory Visit: Payer: Medicare Other | Attending: Cardiovascular Disease | Admitting: Cardiovascular Disease

## 2023-03-01 ENCOUNTER — Other Ambulatory Visit (HOSPITAL_BASED_OUTPATIENT_CLINIC_OR_DEPARTMENT_OTHER): Payer: Self-pay

## 2023-03-01 ENCOUNTER — Encounter: Payer: Self-pay | Admitting: Cardiovascular Disease

## 2023-03-01 VITALS — BP 130/64 | HR 80 | Ht 66.0 in | Wt 215.0 lb

## 2023-03-01 DIAGNOSIS — E669 Obesity, unspecified: Secondary | ICD-10-CM

## 2023-03-01 DIAGNOSIS — I4510 Unspecified right bundle-branch block: Secondary | ICD-10-CM | POA: Insufficient documentation

## 2023-03-01 DIAGNOSIS — E7800 Pure hypercholesterolemia, unspecified: Secondary | ICD-10-CM | POA: Insufficient documentation

## 2023-03-01 DIAGNOSIS — E0590 Thyrotoxicosis, unspecified without thyrotoxic crisis or storm: Secondary | ICD-10-CM | POA: Diagnosis not present

## 2023-03-01 DIAGNOSIS — R002 Palpitations: Secondary | ICD-10-CM | POA: Diagnosis not present

## 2023-03-01 DIAGNOSIS — I10 Essential (primary) hypertension: Secondary | ICD-10-CM | POA: Diagnosis not present

## 2023-03-01 DIAGNOSIS — R7303 Prediabetes: Secondary | ICD-10-CM | POA: Insufficient documentation

## 2023-03-01 MED ORDER — ROSUVASTATIN CALCIUM 20 MG PO TABS
20.0000 mg | ORAL_TABLET | Freq: Every day | ORAL | 3 refills | Status: DC
  Filled 2023-03-01: qty 90, 90d supply, fill #0
  Filled 2023-05-27: qty 90, 90d supply, fill #1
  Filled 2023-08-26: qty 90, 90d supply, fill #2
  Filled 2023-11-28: qty 90, 90d supply, fill #3

## 2023-03-01 NOTE — Patient Instructions (Signed)
Medication Instructions:  Restart zeita 5mg  daily Start crestor 20mg  daily *If you need a refill on your cardiac medications before your next appointment, please call your pharmacy*   Lab Work: Lipid CMP LPA  3 months  If you have labs (blood work) drawn today and your tests are completely normal, you will receive your results only by: MyChart Message (if you have MyChart) OR A paper copy in the mail If you have any lab test that is abnormal or we need to change your treatment, we will call you to review the results.   Testing/Procedures: none   Follow-Up: At [facility], you and your health needs are our priority.  As part of our continuing mission to provide you with exceptional heart care, we have created designated Provider Care Teams.  These Care Teams include your primary Cardiologist (physician) and Advanced Practice Providers (APPs -  Physician Assistants and Nurse Practitioners) who all work together to provide you with the care you need, when you need it.    Your next appointment:   6 month(s)  Provider:   Margaree Mackintosh

## 2023-03-01 NOTE — Progress Notes (Signed)
Cardiology Office Note    Date:  03/06/2023   ID:  Kayla Preston, DOB 29-May-1950, MRN [id number]  PCP:  Hoy Register, MD  Cardiologist:  Nicki Guadalajara, MD   17 month follow-up evaluation  History of Present Illness:  Kayla Preston is a 72 y.o. female  who was referred by [facility] for evaluation of palpitations.  She was evaluated initially on September 05, 2018 in a telemedicine visit. She presents for a 17 month follow-uo evaluation   Kayla Preston  was hospitalized at [facility] in 2010 and underwent an atrial fibrillation ablation. She had done very well with reference to this.  She has a history of hypertension and has been on amlodipine in addition to hydrochlorothiazide.  She also has GI issues which have improved with Linzess.  Recently, she has begun to notice episodic palpitations associated with night sweats.  She denied any associated chest pain.  She had undergone recent laboratory and was found to have an over suppressed thyroid TSH of 0.020.  She was scheduled to see an endocrinologist and  was started on methimazole 5 mg.  She was subsequently evaluated by Dr. Romero Belling.  Subsequent TSH and free T4 were normal and she was advised to continue her same medication.  She also has a history of vitamin D deficiency and was on 50,000 units weekly.    When I initially evaluated her, she was on amlodipine and HCTZ for hypertension.  She was found to have over suppressed thyroid which may be contributing to her arrhythmia and palpitations elected to add Toprol-XL initially at 25 mg.  I recommended that she undergo a 2D echo Doppler study which was done on October 11, 2018 and showed normal LV function with mild LVH and mild diastolic dysfunction.  There is very mild dilation of her aortic root at 39 mm.   When she was evaluated in her follow-up telemedicine evaluation in June 2020 she noted significant benefit since the initiation of Toprol-XL.  She states her blood pressure  typically runs in the 130 systolic range.  She denied any recurrent palpitations,presyncope or syncope or chest pain.  She was sleeping well but often wakes up when neighbors in a nearby apartment seem to smoke in the early morning hours with weed and other compounds  which wakes her up and contributes to congestion.  She was diagnosed with COVID-19 infection and questionable pneumonia.  She was treated with albuterol, Decadron, as well as Zithromax in the emergency room on April 13, 2019.  On December 8 she had an infusion of bamlanivimab.  Following her infusion therapy she felt significant benefit in her symptomatology and shortness of breath.    I saw her in the office in January 2021 at which time she felt well and was without chest pain or palpitations.   She was without recurrent tachypalpitations and tolerating metoprolol XL 25 mg daily.  She continued to be on HCTZ 12.5 mg and denied leg swelling.  She also was on atorvastatin 20 mg for hyperlipidemia.  Her blood pressure was controlled.  I saw her on August 07, 2020.  She had undergone laboratory in January 2022 and had an normal TSH of 1.5.  Renal function was stable with a creatinine of 0.72.  Fasting glucose was 89 and hemoglobin A1c was mildly increased at 6.1.  Presently she denies any chest pain.  She denies any swelling.  She has noticed that her pulse tends to be on the high side.  She has not been successful with weight loss.  During that evaluation, with her increased pulse I recommended slight titration of metoprolol succinate to 50 mg daily.  She continued to be on HCTZ 12.5 mg and was without swelling and recommended she decrease this to every other day.  I last saw her on Sep 25, 2021 at which time she continued to fail we well and was unaware of recurrent palpitations.  She continued to be on metoprolol succinate 50 mg daily and HCTZ 12.5 mg every other day.  She was taken off metformin and also is no longer on thyroid medication.   She is on Trulicity injection weekly.  She denied palpitations, presyncope or syncope.  There is no chest pain.  She has not had recent laboratory and comprehensive laboratory was recommended.  Since I last saw her, she denies any chest pain or shortness of breath.  She does have occasional skipped beats and palpitations.  When I last saw her, weight was 204, she has had some weight gain of 11 pounds.  She has been doing a lot of baking and using a lot of butter and cream.  This has resulted in eating a lot more of foods which she had not eaten as much in the past which has contributed to her weight gain.  She sees Dr. Hoy Register for primary care and recent laboratory showed significant lipid elevation with total cholesterol 249, HDL 65, LDL 168, triglycerides 94.  She has had stopped taking Zetia and is only on low-dose rosuvastatin at 5 mg.  She is on metoprolol succinate 50 mg daily, HCTZ 12.5 mg every other day for hypertension.  She is taking a baby aspirin.  She presents for evaluation.   Past Medical History:  Diagnosis Date   Atrial fibrillation (HCC)    Atrial fibrillation (HCC)    she had ablation which resolved intermittent afib   Hypertension    Obesity    Seasonal allergies     Past Surgical History:  Procedure Laterality Date   ABLATION      Current Medications: Outpatient Medications Prior to Visit  Medication Sig Dispense Refill   albuterol (PROAIR HFA) 108 (90 Base) MCG/ACT inhaler Inhale 2 puffs into the lungs every 6 (six) hours as needed. 6.7 g 1   aspirin EC 81 MG tablet Take 81 mg by mouth daily.     ezetimibe (ZETIA) 10 MG tablet Take 1 tablet (10 mg total) by mouth daily. 90 tablet 1   hydrochlorothiazide (HYDRODIURIL) 25 MG tablet Take 0.5 tablets (12.5 mg total) by mouth every other day.(Keep upcoming appointment for further refills) 45 tablet 1   ketoconazole (NIZORAL) 2 % shampoo Apply 1 Application topically 2 (two) times a week. 120 mL 0   linaclotide  (LINZESS) 145 MCG CAPS capsule TAKE 1 CAPSULE BY MOUTH AT LEAST 30 MINUTES BEFORE THE FIRST MEAL OF THE DAY ON AN EMPTY STOMACH (must make office visit for further refills 90 capsule 1   metoprolol succinate (TOPROL-XL) 50 MG 24 hr tablet Take 1 tablet (50 mg total) by mouth daily. 90 tablet 1   rosuvastatin (CRESTOR) 5 MG tablet Take 1 tablet (5 mg total) by mouth daily. 30 tablet 3   No facility-administered medications prior to visit.     Allergies:   Patient has no known allergies.   Social History   Socioeconomic History   Marital status: Single    Spouse name: Not on file   Number of children: Not on  file   Years of education: Not on file   Highest education level: Not on file  Occupational History   Not on file  Tobacco Use   Smoking status: Never   Smokeless tobacco: Never  Substance and Sexual Activity   Alcohol use: No   Drug use: No   Sexual activity: Not Currently  Other Topics Concern   Not on file  Social History Narrative   ** Merged History Encounter **       Social Determinants of Health   Financial Resource Strain: Low Risk  (09/28/2022)   Overall Financial Resource Strain (CARDIA)    Difficulty of Paying Living Expenses: Not hard at all  Food Insecurity: No Food Insecurity (09/28/2022)   Hunger Vital Sign    Worried About Running Out of Food in the Last Year: Never true    Ran Out of Food in the Last Year: Never true  Transportation Needs: No Transportation Needs (09/28/2022)   PRAPARE - Administrator, Civil Service (Medical): No    Lack of Transportation (Non-Medical): No  Physical Activity: Insufficiently Active (09/28/2022)   Exercise Vital Sign    Days of Exercise per Week: 3 days    Minutes of Exercise per Session: 30 min  Stress: No Stress Concern Present (09/28/2022)   [employer] of Occupational Health - Occupational Stress Questionnaire    Feeling of Stress : Not at all  Social Connections: Unknown (09/28/2022)   Social  Connection and Isolation Panel [NHANES]    Frequency of Communication with Friends and Family: More than three times a week    Frequency of Social Gatherings with Friends and Family: Three times a week    Attends Religious Services: More than 4 times per year    Active Member of Clubs or Organizations: No    Attends Banker Meetings: Never    Marital Status: Patient declined     Family History:  The patient's family history includes Heart disease in her father and mother; Hyperlipidemia in her sister.   ROS General: Negative; No fevers, chills, or night sweats; positive for weight gain HEENT: Negative; No changes in vision or hearing, sinus congestion, difficulty swallowing Pulmonary: Negative; No cough, wheezing, shortness of breath, hemoptysis Cardiovascular: Positive for Covid infection with possible pneumonia GI: Negative; No nausea, vomiting, diarrhea, or abdominal pain GU: Negative; No dysuria, hematuria, or difficulty voiding Musculoskeletal: Negative; no myalgias, joint pain, or weakness Hematologic/Oncology: Negative; no easy bruising, bleeding Endocrine: Negative; no heat/cold intolerance; no diabetes Neuro: Negative; no changes in balance, headaches Skin: Negative; No rashes or skin lesions Psychiatric: Negative; No behavioral problems, depression Sleep: Negative; No snoring, daytime sleepiness, hypersomnolence, bruxism, restless legs, hypnogognic hallucinations, no cataplexy Other comprehensive 14 point system review is negative.   PHYSICAL EXAM:   VS:  BP 130/64   Pulse 80   Ht 5\' 6"  (1.676 m)   Wt 215 lb (97.5 kg)   SpO2 98%   BMI 34.70 kg/m    Repeat blood pressure by me was 130/70  Wt Readings from Last 3 Encounters:  03/01/23 215 lb (97.5 kg)  01/31/23 215 lb 6.4 oz (97.7 kg)  01/09/23 207 lb 14.3 oz (94.3 kg)    General: Alert, oriented, no distress.  Skin: normal turgor, no rashes, warm and dry HEENT: Normocephalic, atraumatic. Pupils  equal round and reactive to light; sclera anicteric; extraocular muscles intact;  Nose without nasal septal hypertrophy Mouth/Parynx benign; Mallinpatti scale 3 Neck: No JVD, no carotid bruits;  normal carotid upstroke Lungs: clear to ausculatation and percussion; no wheezing or rales Chest wall: without tenderness to palpitation Heart: PMI not displaced, RRR, s1 s2 normal, 1/6 systolic murmur, no diastolic murmur, no rubs, gallops, thrills, or heaves Abdomen: soft, nontender; no hepatosplenomehaly, BS+; abdominal aorta nontender and not dilated by palpation. Back: no CVA tenderness Pulses 2+ Musculoskeletal: full range of motion, normal strength, no joint deformities Extremities: no clubbing cyanosis or edema, Homan's sign negative  Neurologic: grossly nonfocal; Cranial nerves grossly wnl Psychologic: Normal mood and affect    Studies/Labs Reviewed:   EKG Interpretation Date/Time:  Tuesday March 01 2023 13:50:00 EDT Ventricular Rate:  80 PR Interval:  140 QRS Duration:  142 QT Interval:  396 QTC Calculation: 456 R Axis:   -2  Text Interpretation: Sinus rhythm with occasional Premature ventricular complexes Right bundle branch block When compared with ECG of 09-Jan-2023 02:08, Premature atrial complexes are no longer Present Confirmed by Nicki Guadalajara ([id number]) on 03/01/2023 2:01:33 PM    Sep 25, 2021 ECG (independently read by me): NSR at 77, RBBB   August 07, 2020 ECG (independently read by me): SInus rhytyhm at 94, PVC; RBBB  January 2021 ECG (independently read by me): Normal sinus rhythm at 75 bpm.  Incomplete right bundle branch block.  No ectopy.  Recent Labs:    Latest Ref Rng & Units 01/31/2023    9:20 AM 01/09/2023    2:03 AM 07/26/2022   10:16 AM  BMP  Glucose 70 - 99 mg/dL 89  604  540   BUN 8 - 27 mg/dL 11  13  11    Creatinine 0.57 - 1.00 mg/dL 9.81  1.91  4.78   BUN/Creat Ratio 12 - 28 13   13    Sodium 134 - 144 mmol/L 142  136  141   Potassium 3.5 - 5.2 mmol/L  4.0  4.0  4.1   Chloride 96 - 106 mmol/L 110  107  108   CO2 20 - 29 mmol/L 20  18  19    Calcium 8.7 - 10.3 mg/dL 9.3  9.2  9.8         Latest Ref Rng & Units 01/31/2023    9:20 AM 01/09/2023    2:03 AM 04/26/2022    3:17 PM  Hepatic Function  Total Protein 6.0 - 8.5 g/dL 6.9  6.7  6.5   Albumin 3.9 - 4.9 g/dL 4.2  3.7  4.1   AST 0 - 40 IU/L 15  21  16    ALT 0 - 32 IU/L 15  20  21    Alk Phosphatase 44 - 121 IU/L 97  76  86   Total Bilirubin 0.0 - 1.2 mg/dL 0.3  0.5  0.3        Latest Ref Rng & Units 01/09/2023    2:03 AM 09/25/2021    8:45 AM 05/06/2021    9:23 AM  CBC  WBC 4.0 - 10.5 K/uL 4.7  4.5  4.6   Hemoglobin 12.0 - 15.0 g/dL 29.5  62.1  30.8   Hematocrit 36.0 - 46.0 % 43.7  42.3  42.1   Platelets 150 - 400 K/uL 226  250  221    Lab Results  Component Value Date   MCV 74.7 (L) 01/09/2023   MCV 75 (L) 09/25/2021   MCV 74 (L) 05/06/2021   Lab Results  Component Value Date   TSH 1.943 01/09/2023   Lab Results  Component Value Date   HGBA1C 6.3 (H)  01/31/2023     BNP No results found for: "BNP"  ProBNP No results found for: "PROBNP"   Lipid Panel     Component Value Date/Time   CHOL 249 (H) 01/31/2023 0920   TRIG 94 01/31/2023 0920   HDL 65 01/31/2023 0920   CHOLHDL 3.2 09/25/2021 0845   CHOLHDL 3.4 05/17/2016 1004   VLDL 18 05/17/2016 1004   LDLCALC 168 (H) 01/31/2023 0920   LABVLDL 16 01/31/2023 0920     RADIOLOGY: No results found.   Additional studies/ records that were reviewed today include:   ECHO 10/11/2018 IMPRESSIONS  1. The left ventricle has normal systolic function, with an ejection fraction of 55-60%. The cavity size was normal. There is mildly increased left ventricular wall thickness. Left ventricular diastolic Doppler parameters are consistent with impaired  relaxation. No evidence of left ventricular regional wall motion abnormalities.  2. The right ventricle has normal systolic function. The cavity was normal. There is no  increase in right ventricular wall thickness.  3. There is mild mitral annular calcification present. Trivial mitral regurgitation.  4. The aortic valve is tricuspid. No stenosis of the aortic valve.  5. There is mild dilatation of the aortic root measuring 39 mm.  6. Normal IVC size. PA systolic pressure 21 mmHg.  ASSESSMENT:    1. Essential hypertension   2. Palpitations   3. Bundle branch block, right   4. Pure hypercholesterolemia   5. Pre-diabetes   6. h/o hyperthyroidism   7. Mild obesity     PLAN:  1.  Essential hypertension: Her blood pressure today is stable at 130/70 on metoprolol 50 mg and HCTZ 12.5 mg daily.  However, she has noticed frequent increased palpitations and ECG shows PVCs.  2.  Palpitations: With her increased palpitations and PVCs, I am recommending slight titration of metoprolol and will change this to 50 mg in the morning and 25 mg at night.  3.  Grade 1 diastolic dysfunction: Noted on prior echo in 2020.  4.  Right bundle branch block: Stable  5.  Previous hyperthyroidism with over suppression of TSH for which she was started on methimazole.  TSH is 1.943 on January 09, 2023.  6.  Obesity: She has gained 11 pounds since her last evaluation with me.  BMI is 34.7.  I again discussed the importance of frequent exercise with American Heart Association recommendation of at least 5 days/week for 30 minutes of moderate intensity.  .  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  Discussed AHA recommendations of 5 days/week of at least 30 minutes of moderate intensity exercise.  7.  Hyperlipidemia: Remotely, she had significant lipid elevation in December 2022 with total cholesterol 242, triglycerides 58 and LDL 167.  At that time she had stopped taking her atorvastatin.  Most recent laboratory today continues to be significantly elevated and she is only been taking recently rosuvastatin 5 mg and stopped taking Zetia.   I have recommended the resumption of Zetia 10 mg daily and will change her rosuvastatin to 20 mg.  I am recommending repeat laboratory in 2 to 3 months with a comprehensive metabolic panel, fasting lipid panel, and will also check an LP(a).    8. COVID-19 infection: December 2020 treated with steroids, Zithromax, and received bamlanivimab infusion therapy with marked resolution of symptoms.  I will contact her regarding her laboratory she will let us know regarding her palpitations.  I will see her in  6 months for follow-up evaluation or sooner as needed.  Medication Adjustments/Labs and Tests Ordered: Current medicines are reviewed at length with the patient today.  Concerns regarding medicines are outlined above.  Medication changes, Labs and Tests ordered today are listed in the Patient Instructions below. Patient Instructions  Medication Instructions:  Restart zeita 5mg  daily Start crestor 20mg  daily *If you need a refill on your cardiac medications before your next appointment, please call your pharmacy*   Lab Work: Lipid CMP LPA  3 months  If you have labs (blood work) drawn today and your tests are completely normal, you will receive your results only by: MyChart Message (if you have MyChart) OR A paper copy in the mail If you have any lab test that is abnormal or we need to change your treatment, we will call you to review the results.   Testing/Procedures: none   Follow-Up: At [facility], you and your health needs are our priority.  As part of our continuing mission to provide you with exceptional heart care, we have created designated Provider Care Teams.  These Care Teams include your primary Cardiologist (physician) and Advanced Practice Providers (APPs -  Physician Assistants and Nurse Practitioners) who all work together to provide you with the care you need, when you need it.    Your next appointment:   6 month(s)  Provider:   Margaree Mackintosh     Signed, Nicki Guadalajara, MD  03/06/2023 12:03 PM    [facility] [address], [address], [place], [place]  [address] Phone: [phone]

## 2023-03-02 ENCOUNTER — Other Ambulatory Visit: Payer: Self-pay

## 2023-03-06 ENCOUNTER — Encounter: Payer: Self-pay | Admitting: Cardiovascular Disease

## 2023-03-18 ENCOUNTER — Other Ambulatory Visit: Payer: Self-pay

## 2023-03-22 ENCOUNTER — Other Ambulatory Visit: Payer: Self-pay

## 2023-03-25 DIAGNOSIS — Z23 Encounter for immunization: Secondary | ICD-10-CM | POA: Diagnosis not present

## 2023-04-11 ENCOUNTER — Other Ambulatory Visit: Payer: Self-pay

## 2023-05-17 ENCOUNTER — Other Ambulatory Visit: Payer: Self-pay

## 2023-05-27 ENCOUNTER — Other Ambulatory Visit: Payer: Self-pay

## 2023-05-31 ENCOUNTER — Other Ambulatory Visit: Payer: Self-pay

## 2023-06-02 DIAGNOSIS — E7800 Pure hypercholesterolemia, unspecified: Secondary | ICD-10-CM | POA: Diagnosis not present

## 2023-06-03 ENCOUNTER — Telehealth: Payer: Self-pay | Admitting: Cardiovascular Disease

## 2023-06-03 LAB — LIPID PANEL
Chol/HDL Ratio: 3.7 {ratio} (ref 0.0–4.4)
Cholesterol, Total: 222 mg/dL — ABNORMAL HIGH (ref 100–199)
HDL: 60 mg/dL (ref >39)
LDL Chol Calc (NIH): 149 mg/dL — ABNORMAL HIGH (ref 0–99)
Triglycerides: 75 mg/dL (ref 0–149)
VLDL Cholesterol Cal: 13 mg/dL (ref 5–40)

## 2023-06-03 LAB — COMPREHENSIVE METABOLIC PANEL
ALT: 20 [IU]/L (ref 0–32)
AST: 21 [IU]/L (ref 0–40)
Albumin: 4.4 g/dL (ref 3.9–4.9)
Alkaline Phosphatase: 113 [IU]/L (ref 44–121)
BUN/Creatinine Ratio: 14 (ref 12–28)
BUN: 12 mg/dL (ref 8–27)
Bilirubin Total: 0.3 mg/dL (ref 0.0–1.2)
CO2: 19 mmol/L — ABNORMAL LOW (ref 20–29)
Calcium: 9.3 mg/dL (ref 8.7–10.3)
Chloride: 107 mmol/L — ABNORMAL HIGH (ref 96–106)
Creatinine, Ser: 0.84 mg/dL (ref 0.57–1.00)
Globulin, Total: 2.5 g/dL (ref 1.5–4.5)
Glucose: 91 mg/dL (ref 70–99)
Potassium: 4.4 mmol/L (ref 3.5–5.2)
Sodium: 140 mmol/L (ref 134–144)
Total Protein: 6.9 g/dL (ref 6.0–8.5)
eGFR: 75 mL/min/{1.73_m2} (ref >59)

## 2023-06-03 LAB — LIPOPROTEIN A (LPA): Lipoprotein (a): 235.3 nmol/L — ABNORMAL HIGH (ref <75.0)

## 2023-06-03 NOTE — Telephone Encounter (Signed)
New Message:    Patient would like for the nurse to call and explain her lab results to her please.

## 2023-06-03 NOTE — Telephone Encounter (Signed)
Called and spoke to patient. Verified name and DOB. Patient is calling to lab results from yesterday. Informed patient I will send provider a message and call back after reviewed.

## 2023-06-08 ENCOUNTER — Encounter: Payer: Self-pay | Admitting: Cardiology

## 2023-06-08 NOTE — Telephone Encounter (Signed)
Called, left message for patient to call back.

## 2023-06-08 NOTE — Telephone Encounter (Signed)
Result Notes   Marykay Lex, MD 06/08/2023  2:57 PM EST Back to Top    Lipid panel shows definite improvement after restarting Zetia.  However still above our target.  The LP(a) is quite elevated.  Unfortunate this time there is no clear medication that is noted to treat this.  It just simply implies a higher risk and therefore we want to be more diligent in treating what we can which is the LDL.  Had previously been 168 and is now 149, but target should be closer to 100 if not lower. Would recommend creasing rosuvastatin to 10 mg daily and continuing Zetia. Reassess in 2 to 3 months with lipid panel.   Chemistry panel is pretty stable with normal liver and kidney function.   Bryan Lemma, MD    Patient was outreached once today, awaiting patient to return call  If patient does not return call, nursing will outreach tomorrow

## 2023-06-09 NOTE — Telephone Encounter (Signed)
Patient identification verified by 2 forms. Marilynn Rail, RN    Called and spoke to patient  Relayed result message below  Patient states:   -she takes Rosuvastatin 20mg  daily and Zetia 10mg    -she had d/c rosuvastatin for 4 weeks due to muscle pain   -she recently restarted rosuvastatin 20mg  three weeks ago, no longer having muscle  Informed patient message sent to Dr. Herbie Baltimore to clarify prescription

## 2023-06-09 NOTE — Progress Notes (Signed)
Seen via [employer]

## 2023-06-10 ENCOUNTER — Other Ambulatory Visit: Payer: Self-pay

## 2023-06-10 NOTE — Telephone Encounter (Signed)
She is due to see me back in April.  Lets keep on current meds and reassess lipids at that time.  We can probably talk about doing a coronary calcium score either before or after I see her.  If the calcium score is elevated, then we may be able to get her referred for possible PCSK9 inhibitor.  If she is willing to do a Coronary Calcium Score before the visit, that would be helpful.   Bryan Lemma, MD (Can we make this string into an encounter to keep it in the chart?)

## 2023-06-13 NOTE — Telephone Encounter (Signed)
Agree - would defer to Dr. Kirtland Bouchard -- maybe just recheck lipids in ~2 months then set up f/u to determine next step  [facility]

## 2023-06-14 NOTE — Telephone Encounter (Signed)
 Patient identification verified by 2 forms. Bertina Cooks, RN    Called and spoke to patient  Relayed provider message below  Patient states:   -she is taking her medications more consistently   -she is exercising more and changing diet   -planning to focus more on weight loss   -would like to know if there is anything more she can do  Advised patient continue with dietary and life style change, continue taking medications and follow up at OV  Patient scheduled for OV with Dr. Burnard 4/24 at 11:00am  Patient verbalized understanding, no questions at this time

## 2023-06-17 ENCOUNTER — Other Ambulatory Visit: Payer: Self-pay

## 2023-06-20 ENCOUNTER — Other Ambulatory Visit: Payer: Self-pay

## 2023-06-20 NOTE — Telephone Encounter (Signed)
 Acknowledged.

## 2023-07-07 ENCOUNTER — Other Ambulatory Visit: Payer: Self-pay

## 2023-07-12 ENCOUNTER — Other Ambulatory Visit: Payer: Self-pay

## 2023-07-15 DIAGNOSIS — H26492 Other secondary cataract, left eye: Secondary | ICD-10-CM | POA: Diagnosis not present

## 2023-07-15 DIAGNOSIS — R7309 Other abnormal glucose: Secondary | ICD-10-CM | POA: Diagnosis not present

## 2023-07-15 DIAGNOSIS — Z961 Presence of intraocular lens: Secondary | ICD-10-CM | POA: Diagnosis not present

## 2023-07-15 DIAGNOSIS — H2511 Age-related nuclear cataract, right eye: Secondary | ICD-10-CM | POA: Diagnosis not present

## 2023-07-15 LAB — HM DIABETES EYE EXAM

## 2023-08-02 ENCOUNTER — Ambulatory Visit: Payer: Medicare Other | Attending: Family Medicine | Admitting: Family Medicine

## 2023-08-02 ENCOUNTER — Encounter: Payer: Self-pay | Admitting: Family Medicine

## 2023-08-02 ENCOUNTER — Other Ambulatory Visit: Payer: Self-pay

## 2023-08-02 VITALS — BP 146/75 | HR 81 | Ht 66.0 in | Wt 216.8 lb

## 2023-08-02 DIAGNOSIS — K5909 Other constipation: Secondary | ICD-10-CM

## 2023-08-02 DIAGNOSIS — Z79899 Other long term (current) drug therapy: Secondary | ICD-10-CM | POA: Insufficient documentation

## 2023-08-02 DIAGNOSIS — E669 Obesity, unspecified: Secondary | ICD-10-CM | POA: Diagnosis not present

## 2023-08-02 DIAGNOSIS — R7303 Prediabetes: Secondary | ICD-10-CM | POA: Diagnosis not present

## 2023-08-02 DIAGNOSIS — Z6834 Body mass index (BMI) 34.0-34.9, adult: Secondary | ICD-10-CM | POA: Insufficient documentation

## 2023-08-02 DIAGNOSIS — K5900 Constipation, unspecified: Secondary | ICD-10-CM | POA: Diagnosis not present

## 2023-08-02 DIAGNOSIS — Z713 Dietary counseling and surveillance: Secondary | ICD-10-CM | POA: Insufficient documentation

## 2023-08-02 DIAGNOSIS — E039 Hypothyroidism, unspecified: Secondary | ICD-10-CM | POA: Diagnosis not present

## 2023-08-02 DIAGNOSIS — E785 Hyperlipidemia, unspecified: Secondary | ICD-10-CM | POA: Diagnosis not present

## 2023-08-02 DIAGNOSIS — E7800 Pure hypercholesterolemia, unspecified: Secondary | ICD-10-CM | POA: Insufficient documentation

## 2023-08-02 DIAGNOSIS — Z7182 Exercise counseling: Secondary | ICD-10-CM | POA: Diagnosis not present

## 2023-08-02 DIAGNOSIS — I10 Essential (primary) hypertension: Secondary | ICD-10-CM | POA: Insufficient documentation

## 2023-08-02 LAB — POCT GLYCOSYLATED HEMOGLOBIN (HGB A1C): HbA1c, POC (prediabetic range): 6 % (ref 5.7–6.4)

## 2023-08-02 MED ORDER — LINACLOTIDE 145 MCG PO CAPS
145.0000 ug | ORAL_CAPSULE | Freq: Every day | ORAL | 1 refills | Status: DC
  Filled 2023-08-02 – 2023-08-12 (×2): qty 90, 90d supply, fill #0
  Filled 2023-11-14: qty 90, 90d supply, fill #1

## 2023-08-02 MED ORDER — EZETIMIBE 10 MG PO TABS
10.0000 mg | ORAL_TABLET | Freq: Every day | ORAL | 1 refills | Status: DC
  Filled 2023-08-02 – 2023-09-13 (×2): qty 90, 90d supply, fill #0
  Filled 2023-12-15: qty 90, 90d supply, fill #1

## 2023-08-02 MED ORDER — METOPROLOL SUCCINATE ER 50 MG PO TB24
50.0000 mg | ORAL_TABLET | Freq: Every day | ORAL | 1 refills | Status: DC
  Filled 2023-08-02 – 2023-10-10 (×2): qty 90, 90d supply, fill #0
  Filled 2024-01-10: qty 90, 90d supply, fill #1

## 2023-08-02 NOTE — Progress Notes (Signed)
 Subjective:  Patient ID: Kayla Preston, female    DOB: [birth year]  Age: 73 y.o. MRN: [id number]  CC: Medical Management of Chronic Issues     Discussed the use of AI scribe software for clinical note transcription with the patient, who gave verbal consent to proceed.  History of Present Illness The patient, with a history of prediabetes, hypertension, hypothyroidism (not on medication, SVT, hyperlipidemia presents with concerns about recent weight gain and high cholesterol. She reports feeling "heavy" and expresses embarrassment about her cholesterol levels. She has been prescribed Crestor and ezetimibe by her cardiologist, and she has been trying to manage her weight through diet, specifically by eating cabbage, cucumber, and onions. She also mentions that she has been gardening as a form of exercise and stress relief.  The patient also has a history of polyps and has been prescribed Linzess, which she has stopped taking as she has been able to go to the bathroom on her own. She expresses interest in resuming the medication if needed.  The patient also expresses interest in joining an exercise class, specifically mentioning the [employer] program. She is unsure if her insurance would cover such a program.  The patient also mentions feeling stressed due to political issues, which she believes may be contributing to her elevated blood pressure.    Past Medical History:  Diagnosis Date   Atrial fibrillation (HCC)    Atrial fibrillation (HCC)    she had ablation which resolved intermittent afib   Hypertension    Obesity    Seasonal allergies     Past Surgical History:  Procedure Laterality Date   ABLATION      Family History  Problem Relation Age of Onset   Heart disease Mother    Heart disease Father    Hyperlipidemia Sister    Thyroid disease Neg Hx     Social History   Socioeconomic History   Marital status: Single    Spouse name: Not on file   Number of  children: Not on file   Years of education: Not on file   Highest education level: Not on file  Occupational History   Not on file  Tobacco Use   Smoking status: Never   Smokeless tobacco: Never  Substance and Sexual Activity   Alcohol use: No   Drug use: No   Sexual activity: Not Currently  Other Topics Concern   Not on file  Social History Narrative   ** Merged History Encounter **       Social Drivers of Health   Financial Resource Strain: Low Risk  (09/28/2022)   Overall Financial Resource Strain (CARDIA)    Difficulty of Paying Living Expenses: Not hard at all  Food Insecurity: No Food Insecurity (09/28/2022)   Hunger Vital Sign    Worried About Running Out of Food in the Last Year: Never true    Ran Out of Food in the Last Year: Never true  Transportation Needs: No Transportation Needs (09/28/2022)   PRAPARE - Administrator, Civil Service (Medical): No    Lack of Transportation (Non-Medical): No  Physical Activity: Insufficiently Active (09/28/2022)   Exercise Vital Sign    Days of Exercise per Week: 3 days    Minutes of Exercise per Session: 30 min  Stress: No Stress Concern Present (09/28/2022)   [employer] of Occupational Health - Occupational Stress Questionnaire    Feeling of Stress : Not at all  Social Connections: Unknown (09/28/2022)   Social  Connection and Isolation Panel [NHANES]    Frequency of Communication with Friends and Family: More than three times a week    Frequency of Social Gatherings with Friends and Family: Three times a week    Attends Religious Services: More than 4 times per year    Active Member of Clubs or Organizations: No    Attends Banker Meetings: Never    Marital Status: Patient declined    No Known Allergies  Outpatient Medications Prior to Visit  Medication Sig Dispense Refill   albuterol (PROAIR HFA) 108 (90 Base) MCG/ACT inhaler Inhale 2 puffs into the lungs every 6 (six) hours as needed. 6.7 g 1    aspirin EC 81 MG tablet Take 81 mg by mouth daily.     hydrochlorothiazide (HYDRODIURIL) 25 MG tablet Take 0.5 tablets (12.5 mg total) by mouth every other day.(Keep upcoming appointment for further refills) 45 tablet 1   rosuvastatin (CRESTOR) 20 MG tablet Take 1 tablet (20 mg total) by mouth daily. 90 tablet 3   ezetimibe (ZETIA) 10 MG tablet Take 1 tablet (10 mg total) by mouth daily. 90 tablet 1   linaclotide (LINZESS) 145 MCG CAPS capsule TAKE 1 CAPSULE BY MOUTH AT LEAST 30 MINUTES BEFORE THE FIRST MEAL OF THE DAY ON AN EMPTY STOMACH (must make office visit for further refills 90 capsule 1   metoprolol succinate (TOPROL-XL) 50 MG 24 hr tablet Take 1 tablet (50 mg total) by mouth daily. 90 tablet 1   ketoconazole (NIZORAL) 2 % shampoo Apply 1 Application topically 2 (two) times a week. (Patient not taking: Reported on 08/02/2023) 120 mL 0   No facility-administered medications prior to visit.     ROS Review of Systems  Constitutional:  Negative for activity change and appetite change.  HENT:  Negative for sinus pressure and sore throat.   Respiratory:  Negative for chest tightness, shortness of breath and wheezing.   Cardiovascular:  Negative for chest pain and palpitations.  Gastrointestinal:  Negative for abdominal distention, abdominal pain and constipation.  Genitourinary: Negative.   Musculoskeletal: Negative.   Psychiatric/Behavioral:  Negative for behavioral problems and dysphoric mood.     Objective:  BP (!) 146/75   Pulse 81   Ht 5\' 6"  (1.676 m)   Wt 216 lb 12.8 oz (98.3 kg)   SpO2 97%   BMI 34.99 kg/m      08/02/2023    8:41 AM 03/01/2023    1:52 PM 01/31/2023    8:43 AM  BP/Weight  Systolic BP 146 130 134  Diastolic BP 75 64 79  Wt. (Lbs) 216.8 215 215.4  BMI 34.99 kg/m2 34.7 kg/m2 34.77 kg/m2      Physical Exam Constitutional:      Appearance: She is well-developed.  Cardiovascular:     Rate and Rhythm: Normal rate.     Heart sounds: Normal heart  sounds. No murmur heard. Pulmonary:     Effort: Pulmonary effort is normal.     Breath sounds: Normal breath sounds. No wheezing or rales.  Chest:     Chest wall: No tenderness.  Abdominal:     General: Bowel sounds are normal. There is no distension.     Palpations: Abdomen is soft. There is no mass.     Tenderness: There is no abdominal tenderness.  Musculoskeletal:        General: Normal range of motion.     Right lower leg: No edema.     Left lower leg: No  edema.  Neurological:     Mental Status: She is alert and oriented to person, place, and time.  Psychiatric:        Mood and Affect: Mood normal.        Latest Ref Rng & Units 06/02/2023    9:33 AM 01/31/2023    9:20 AM 01/09/2023    2:03 AM  CMP  Glucose 70 - 99 mg/dL 91  89  413   BUN 8 - 27 mg/dL 12  11  13    Creatinine 0.57 - 1.00 mg/dL 2.44  0.10  2.72   Sodium 134 - 144 mmol/L 140  142  136   Potassium 3.5 - 5.2 mmol/L 4.4  4.0  4.0   Chloride 96 - 106 mmol/L 107  110  107   CO2 20 - 29 mmol/L 19  20  18    Calcium 8.7 - 10.3 mg/dL 9.3  9.3  9.2   Total Protein 6.0 - 8.5 g/dL 6.9  6.9  6.7   Total Bilirubin 0.0 - 1.2 mg/dL 0.3  0.3  0.5   Alkaline Phos 44 - 121 IU/L 113  97  76   AST 0 - 40 IU/L 21  15  21    ALT 0 - 32 IU/L 20  15  20      Lipid Panel     Component Value Date/Time   CHOL 222 (H) 06/02/2023 0933   TRIG 75 06/02/2023 0933   HDL 60 06/02/2023 0933   CHOLHDL 3.7 06/02/2023 0933   CHOLHDL 3.4 05/17/2016 1004   VLDL 18 05/17/2016 1004   LDLCALC 149 (H) 06/02/2023 0933    CBC    Component Value Date/Time   WBC 4.7 01/09/2023 0203   RBC 5.85 (H) 01/09/2023 0203   HGB 13.6 01/09/2023 0203   HGB 13.2 09/25/2021 0845   HCT 43.7 01/09/2023 0203   HCT 42.3 09/25/2021 0845   PLT 226 01/09/2023 0203   PLT 250 09/25/2021 0845   MCV 74.7 (L) 01/09/2023 0203   MCV 75 (L) 09/25/2021 0845   MCH 23.2 (L) 01/09/2023 0203   MCHC 31.1 01/09/2023 0203   RDW 16.2 (H) 01/09/2023 0203   RDW 16.0 (H)  09/25/2021 0845   LYMPHSABS 1.7 09/25/2021 0845   MONOABS 0.8 08/13/2020 2128   EOSABS 0.1 09/25/2021 0845   BASOSABS 0.0 09/25/2021 0845    Lab Results  Component Value Date   HGBA1C 6.0 08/02/2023        Assessment & Plan Hypertension Blood pressure slightly elevated, likely due to stress. - Recheck blood pressure during visit still slightly elevated - Encouraged stress management techniques. -No regimen changes today as blood pressure was normal at her last visit -Counseled on blood pressure goal of less than 130/80, low-sodium, DASH diet, medication compliance, 150 minutes of moderate intensity exercise per week. Discussed medication compliance, adverse effects.   Hyperlipidemia Prescribed Crestor and ezetimibe by cardiologist.  - Monitor cholesterol levels regularly. -Low Cholesterol diet  Prediabetes A1c is 6.0. Making dietary changes and interested in exercise program. - Refer to case manager for insurance-covered exercise programs - placed referral to [facility] - Encouraged continuation of dietary modifications.  Obesity Motivated for weight loss through dietary changes and exercise. - Refer to case manager for insurance-covered exercise programs. - Encouraged dietary modifications and weight loss efforts.  Constipation - Refill Linzess prescription. - Advised to use Linzess as needed.      Meds ordered this encounter  Medications   linaclotide (LINZESS) 145  MCG CAPS capsule    Sig: TAKE 1 CAPSULE BY MOUTH AT LEAST 30 MINUTES BEFORE THE FIRST MEAL OF THE DAY ON AN EMPTY STOMACH.    Dispense:  90 capsule    Refill:  1   ezetimibe (ZETIA) 10 MG tablet    Sig: Take 1 tablet (10 mg total) by mouth daily.    Dispense:  90 tablet    Refill:  1   metoprolol succinate (TOPROL-XL) 50 MG 24 hr tablet    Sig: Take 1 tablet (50 mg total) by mouth daily.    Dispense:  90 tablet    Refill:  1    Follow-up: Return in about 6 months (around 02/02/2024) for Chronic  medical conditions.       Hoy Register, MD, FAAFP. [facility] [place], [place] [phone]   08/02/2023, 10:14 AM

## 2023-08-02 NOTE — Patient Instructions (Signed)
 VISIT SUMMARY:  During today's visit, we discussed your recent weight gain, high cholesterol, and other health concerns. We reviewed your current medications and lifestyle changes, and we talked about ways to manage your stress and improve your overall health.  YOUR PLAN:  -HYPERTENSION: Hypertension means high blood pressure, which can be influenced by stress. We rechecked your blood pressure during the visit and discussed stress management techniques to help control it.  -HYPERLIPIDEMIA: Hyperlipidemia means high cholesterol levels in the blood. You are currently taking atorvastatin and ezetimibe as prescribed by your cardiologist. Please continue these medications and monitor your cholesterol levels regularly.  -PREDIABETES: Prediabetes means your blood sugar levels are higher than normal but not high enough to be classified as diabetes. Your A1c is 6.0. We discussed dietary changes and referred you to a case manager to find insurance-covered exercise programs.  -OBESITY: Obesity means having excess body fat. You are motivated to lose weight through dietary changes and exercise. We referred you to a case manager to find insurance-covered exercise programs and encouraged you to continue your weight loss efforts.  -CONSTIPATION: Constipation means having difficulty with bowel movements. You have stopped taking Linzess but may resume it if needed. We refilled your Linzess prescription and advised you to use it as needed.  INSTRUCTIONS:  Please follow up with the case manager to explore insurance-covered exercise programs. Continue monitoring your blood pressure and cholesterol levels, and use Linzess as needed for constipation. If you have any concerns or experience any new symptoms, please schedule a follow-up appointment.

## 2023-08-03 ENCOUNTER — Telehealth: Payer: Self-pay | Admitting: Family Medicine

## 2023-08-03 NOTE — Telephone Encounter (Unsigned)
 Copied from CRM [id number]. Topic: General - Other >> Aug 03, 2023  2:45 PM Archie Patten S wrote: Reason for CRM: Patient would like to remove herself from opting into the AI in MyChart. Callback number is [phone]

## 2023-08-03 NOTE — Telephone Encounter (Signed)
 Noted.

## 2023-08-04 ENCOUNTER — Telehealth: Payer: Self-pay | Admitting: *Deleted

## 2023-08-04 NOTE — Telephone Encounter (Signed)
 Contacted regarding [employer] Class referral. Left voice message to return call for more information.

## 2023-08-08 ENCOUNTER — Other Ambulatory Visit: Payer: Self-pay

## 2023-08-11 ENCOUNTER — Other Ambulatory Visit: Payer: Self-pay

## 2023-08-12 ENCOUNTER — Other Ambulatory Visit: Payer: Self-pay

## 2023-08-12 ENCOUNTER — Other Ambulatory Visit (HOSPITAL_COMMUNITY): Payer: Self-pay

## 2023-08-26 ENCOUNTER — Other Ambulatory Visit: Payer: Self-pay

## 2023-08-29 ENCOUNTER — Other Ambulatory Visit: Payer: Self-pay

## 2023-09-01 ENCOUNTER — Encounter: Payer: Self-pay | Admitting: Cardiovascular Disease

## 2023-09-01 ENCOUNTER — Ambulatory Visit: Payer: Medicare Other | Attending: Cardiovascular Disease | Admitting: Cardiovascular Disease

## 2023-09-01 VITALS — BP 122/74 | HR 54 | Ht 66.0 in | Wt 212.4 lb

## 2023-09-01 DIAGNOSIS — R002 Palpitations: Secondary | ICD-10-CM | POA: Diagnosis not present

## 2023-09-01 DIAGNOSIS — E7841 Elevated Lipoprotein(a): Secondary | ICD-10-CM | POA: Diagnosis not present

## 2023-09-01 DIAGNOSIS — I4510 Unspecified right bundle-branch block: Secondary | ICD-10-CM | POA: Diagnosis not present

## 2023-09-01 DIAGNOSIS — I10 Essential (primary) hypertension: Secondary | ICD-10-CM | POA: Diagnosis not present

## 2023-09-01 DIAGNOSIS — E669 Obesity, unspecified: Secondary | ICD-10-CM

## 2023-09-01 DIAGNOSIS — E785 Hyperlipidemia, unspecified: Secondary | ICD-10-CM | POA: Diagnosis not present

## 2023-09-01 NOTE — Patient Instructions (Signed)
 Medication Instructions:  Your physician recommends that you continue on your current medications as directed. Please refer to the Current Medication list given to you today.    *If you need a refill on your cardiac medications before your next appointment, please call your pharmacy*   Lab Work: Your physician recommends that you return for lab work in 1 WEEK FOR  CMET  LIPID PANEL   If you have labs (blood work) drawn today and your tests are completely normal, you will receive your results only by: MyChart Message (if you have MyChart) OR A paper copy in the mail If you have any lab test that is abnormal or we need to change your treatment, we will call you to review the results.   Testing/Procedures: NONE    Follow-Up: At [facility], you and your health needs are our priority.  As part of our continuing mission to provide you with exceptional heart care, we have created designated Provider Care Teams.  These Care Teams include your primary Cardiologist (physician) and Advanced Practice Providers (APPs -  Physician Assistants and Nurse Practitioners) who all work together to provide you with the care you need, when you need it.  We recommend signing up for the patient portal called "MyChart".  Sign up information is provided on this After Visit Summary.  MyChart is used to connect with patients for Virtual Visits (Telemedicine).  Patients are able to view lab/test results, encounter notes, upcoming appointments, etc.  Non-urgent messages can be sent to your provider as well.   To learn more about what you can do with MyChart, go to [email or web address].    Your next appointment:   6 month(s)  The format for your next appointment:   In Person  Provider:   Jerryl Morin, MD    Other Instructions

## 2023-09-01 NOTE — Progress Notes (Unsigned)
 Cardiology Office Note    Date:  09/02/2023   ID:  Kayla Preston, DOB [birth year], MRN [id number]  PCP:  Joaquin Mulberry, MD  Cardiologist:  Magnus Schuller, MD   6 month follow-up evaluation  History of Present Illness:  Kayla Preston is a 73 y.o. female  who was referred by [facility] for evaluation of palpitations.  She was evaluated initially on September 05, 2018 in a telemedicine visit.  I last saw her on March 01, 2023.  She presents for a 65-month follow-up evaluation.    Kayla Preston  was hospitalized at [facility] in 2010 and underwent an atrial fibrillation ablation. She had done very well with reference to this.  She has a history of hypertension and has been on amlodipine  in addition to hydrochlorothiazide .  She also has GI issues which have improved with Linzess .  Recently, she has begun to notice episodic palpitations associated with night sweats.  She denied any associated chest pain.  She had undergone recent laboratory and was found to have an over suppressed thyroid  TSH of 0.020.  She was scheduled to see an endocrinologist and was started on methimazole  5 mg.  She was subsequently evaluated by Dr. Gwyndolyn Lerner.  Subsequent TSH and free T4 were normal and she was advised to continue her same medication.  She also has a history of vitamin D  deficiency and was on 50,000 units weekly.    When I initially evaluated her, she was on amlodipine  and HCTZ for hypertension.  She was found to have over suppressed thyroid  which may be contributing to her arrhythmia and palpitations elected to add Toprol -XL initially at 25 mg.  I recommended that she undergo a 2D echo Doppler study which was done on October 11, 2018 and showed normal LV function with mild LVH and mild diastolic dysfunction.  There is very mild dilation of her aortic root at 39 mm.   When she was evaluated in her follow-up telemedicine evaluation in June 2020 she noted significant benefit since the initiation of Toprol -XL.   She states her blood pressure typically runs in the 130 systolic range.  She denied any recurrent palpitations,presyncope or syncope or chest pain.  She was sleeping well but often wakes up when neighbors in a nearby apartment seem to smoke in the early morning hours with weed and other compounds  which wakes her up and contributes to congestion.  She was diagnosed with COVID-19 infection and questionable pneumonia.  She was treated with albuterol , Decadron , as well as Zithromax  in the emergency room on April 13, 2019.  On December 8 she had an infusion of bamlanivimab .  Following her infusion therapy she felt significant benefit in her symptomatology and shortness of breath.    I saw her in the office in January 2021 at which time she felt well and was without chest pain or palpitations.   She was without recurrent tachypalpitations and tolerating metoprolol  XL 25 mg daily.  She continued to be on HCTZ 12.5 mg and denied leg swelling.  She also was on atorvastatin  20 mg for hyperlipidemia.  Her blood pressure was controlled.  I saw her on August 07, 2020.  She had undergone laboratory in January 2022 and had an normal TSH of 1.5.  Renal function was stable with a creatinine of 0.72.  Fasting glucose was 89 and hemoglobin A1c was mildly increased at 6.1.  Presently she denies any chest pain.  She denies any swelling.  She has noticed that  her pulse tends to be on the high side.  She has not been successful with weight loss.  During that evaluation, with her increased pulse I recommended slight titration of metoprolol  succinate to 50 mg daily.  She continued to be on HCTZ 12.5 mg and was without swelling and recommended she decrease this to every other day.  I saw her on Sep 25, 2021 at which time she continued to fail we well and was unaware of recurrent palpitations.  She continued to be on metoprolol  succinate 50 mg daily and HCTZ 12.5 mg every other day.  She was taken off metformin  and also is no longer  on thyroid  medication.  She is on Trulicity  injection weekly.  She denied palpitations, presyncope or syncope.  There is no chest pain.  She has not had recent laboratory and comprehensive laboratory was recommended.  I last saw her on March 01, 2023.  She denied any chest pain or shortness of breath.  She does have occasional skipped beats and palpitations.  When I last saw her, weight was 204, she has had some weight gain of 11 pounds.  She has been doing a lot of baking and using a lot of butter and cream.  This has resulted in eating a lot more of foods which she had not eaten as much in the past which has contributed to her weight gain.  She sees Dr. Enobong Newlin for primary care and recent laboratory showed significant lipid elevation with total cholesterol 249, HDL 65, LDL 168, triglycerides 94.  She has had stopped taking Zetia  and is only on low-dose rosuvastatin  at 5 mg.  She is on metoprolol  succinate 50 mg daily, HCTZ 12.5 mg every other day for hypertension.  She is taking a baby aspirin.  During that evaluation with increased palpitations and PVCs I recommended she increase metoprolol  to 50 mg in the morning and 25 mg at night.  She had stopped taking Zetia  and was only on low-dose rosuvastatin  5 mg.  LDL cholesterol in December 2022 was elevated at 167.  I recommended resumption of Zetia  and increasing rosuvastatin  to 20 mg with plans to recheck laboratory including LP(a).  Since I last saw her, she has felt well.  Laboratory revealed significant elevation of LP(a) at 235.  Palpitations have improved with her increased metoprolol  dose.  She has lost several pounds but not significant weight.  She has been taking Zetia  10 mg and rosuvastatin  20 mg.  She takes HCTZ 12.5 mg every other day and continues to be on metoprolol  succinate 50 mg daily.  She takes baby aspirin.  She presents for evaluation.   Past Medical History:  Diagnosis Date   Atrial fibrillation (HCC)    Atrial fibrillation  (HCC)    she had ablation which resolved intermittent afib   Hypertension    Obesity    Seasonal allergies     Past Surgical History:  Procedure Laterality Date   ABLATION      Current Medications: Outpatient Medications Prior to Visit  Medication Sig Dispense Refill   albuterol  (PROAIR  HFA) 108 (90 Base) MCG/ACT inhaler Inhale 2 puffs into the lungs every 6 (six) hours as needed. 6.7 g 1   aspirin EC 81 MG tablet Take 81 mg by mouth daily.     ezetimibe  (ZETIA ) 10 MG tablet Take 1 tablet (10 mg total) by mouth daily. 90 tablet 1   hydrochlorothiazide  (HYDRODIURIL ) 25 MG tablet Take 0.5 tablets (12.5 mg total) by mouth every other  day.(Keep upcoming appointment for further refills) 45 tablet 1   linaclotide  (LINZESS ) 145 MCG CAPS capsule TAKE 1 CAPSULE BY MOUTH AT LEAST 30 MINUTES BEFORE THE FIRST MEAL OF THE DAY ON AN EMPTY STOMACH. 90 capsule 1   metoprolol  succinate (TOPROL -XL) 50 MG 24 hr tablet Take 1 tablet (50 mg total) by mouth daily. 90 tablet 1   rosuvastatin  (CRESTOR ) 20 MG tablet Take 1 tablet (20 mg total) by mouth daily. 90 tablet 3   ketoconazole  (NIZORAL ) 2 % shampoo Apply 1 Application topically 2 (two) times a week. (Patient not taking: Reported on 08/02/2023) 120 mL 0   No facility-administered medications prior to visit.     Allergies:   Patient has no known allergies.   Social History   Socioeconomic History   Marital status: Single    Spouse name: Not on file   Number of children: Not on file   Years of education: Not on file   Highest education level: Not on file  Occupational History   Not on file  Tobacco Use   Smoking status: Never   Smokeless tobacco: Never  Substance and Sexual Activity   Alcohol use: No   Drug use: No   Sexual activity: Not Currently  Other Topics Concern   Not on file  Social History Narrative   ** Merged History Encounter **       Social Drivers of Health   Financial Resource Strain: Low Risk  (09/28/2022)   Overall  Financial Resource Strain (CARDIA)    Difficulty of Paying Living Expenses: Not hard at all  Food Insecurity: No Food Insecurity (09/28/2022)   Hunger Vital Sign    Worried About Running Out of Food in the Last Year: Never true    Ran Out of Food in the Last Year: Never true  Transportation Needs: No Transportation Needs (09/28/2022)   PRAPARE - Administrator, Civil Service (Medical): No    Lack of Transportation (Non-Medical): No  Physical Activity: Insufficiently Active (09/28/2022)   Exercise Vital Sign    Days of Exercise per Week: 3 days    Minutes of Exercise per Session: 30 min  Stress: No Stress Concern Present (09/28/2022)   [employer] of Occupational Health - Occupational Stress Questionnaire    Feeling of Stress : Not at all  Social Connections: Unknown (09/28/2022)   Social Connection and Isolation Panel [NHANES]    Frequency of Communication with Friends and Family: More than three times a week    Frequency of Social Gatherings with Friends and Family: Three times a week    Attends Religious Services: More than 4 times per year    Active Member of Clubs or Organizations: No    Attends Banker Meetings: Never    Marital Status: Patient declined     Family History:  The patient's family history includes Heart disease in her father and mother; Hyperlipidemia in her sister.   ROS General: Negative; No fevers, chills, or night sweats; positive for weight gain HEENT: Negative; No changes in vision or hearing, sinus congestion, difficulty swallowing Pulmonary: Negative; No cough, wheezing, shortness of breath, hemoptysis Cardiovascular: Positive for Covid infection with possible pneumonia GI: Negative; No nausea, vomiting, diarrhea, or abdominal pain GU: Negative; No dysuria, hematuria, or difficulty voiding Musculoskeletal: Negative; no myalgias, joint pain, or weakness Hematologic/Oncology: Negative; no easy bruising, bleeding Endocrine:  Negative; no heat/cold intolerance; no diabetes Neuro: Negative; no changes in balance, headaches Skin: Negative; No rashes or skin  lesions Psychiatric: Negative; No behavioral problems, depression Sleep: Negative; No snoring, daytime sleepiness, hypersomnolence, bruxism, restless legs, hypnogognic hallucinations, no cataplexy Other comprehensive 14 point system review is negative.   PHYSICAL EXAM:   VS:  BP 122/74   Pulse (!) 54   Ht 5\' 6"  (1.676 m)   Wt 212 lb 6.4 oz (96.3 kg)   SpO2 98%   BMI 34.28 kg/m    Repeat blood pressure by me was 120/70  Wt Readings from Last 3 Encounters:  09/01/23 212 lb 6.4 oz (96.3 kg)  08/02/23 216 lb 12.8 oz (98.3 kg)  03/01/23 215 lb (97.5 kg)    General: Alert, oriented, no distress.  Skin: normal turgor, no rashes, warm and dry HEENT: Normocephalic, atraumatic. Pupils equal round and reactive to light; sclera anicteric; extraocular muscles intact;  Nose without nasal septal hypertrophy Mouth/Parynx benign; Mallinpatti scale 3 Neck: No JVD, no carotid bruits; normal carotid upstroke Lungs: clear to ausculatation and percussion; no wheezing or rales Chest wall: without tenderness to palpitation Heart: PMI not displaced, RRR, s1 s2 normal, 1/6 systolic murmur, no diastolic murmur, no rubs, gallops, thrills, or heaves Abdomen: soft, nontender; no hepatosplenomehaly, BS+; abdominal aorta nontender and not dilated by palpation. Back: no CVA tenderness Pulses 2+ Musculoskeletal: full range of motion, normal strength, no joint deformities Extremities: no clubbing cyanosis or edema, Homan's sign negative  Neurologic: grossly nonfocal; Cranial nerves grossly wnl Psychologic: Normal mood and affect    Studies/Labs Reviewed:   EKG Interpretation Date/Time:  Thursday September 01 2023 11:26:53 EDT Ventricular Rate:  64 PR Interval:  150 QRS Duration:  138 QT Interval:  416 QTC Calculation: 429 R Axis:   -15  Text Interpretation: Normal sinus  rhythm Right bundle branch block When compared with ECG of 01-Mar-2023 13:50, Premature ventricular complexes are no longer Present Confirmed by Magnus Schuller ([id number]) on 09/02/2023 8:02:50 PM    March 01, 2023 ECG (independently read by me): Sinus rhythm with occasional PVCs, ventricular rate 80, right bundle branch block  Sep 25, 2021 ECG (independently read by me): NSR at 77, RBBB   August 07, 2020 ECG (independently read by me): SInus rhytyhm at 94, PVC; RBBB  January 2021 ECG (independently read by me): Normal sinus rhythm at 75 bpm.  Incomplete right bundle branch block.  No ectopy.  Recent Labs:    Latest Ref Rng & Units 06/02/2023    9:33 AM 01/31/2023    9:20 AM 01/09/2023    2:03 AM  BMP  Glucose 70 - 99 mg/dL 91  89  604   BUN 8 - 27 mg/dL 12  11  13    Creatinine 0.57 - 1.00 mg/dL 5.40  9.81  1.91   BUN/Creat Ratio 12 - 28 14  13     Sodium 134 - 144 mmol/L 140  142  136   Potassium 3.5 - 5.2 mmol/L 4.4  4.0  4.0   Chloride 96 - 106 mmol/L 107  110  107   CO2 20 - 29 mmol/L 19  20  18    Calcium  8.7 - 10.3 mg/dL 9.3  9.3  9.2         Latest Ref Rng & Units 06/02/2023    9:33 AM 01/31/2023    9:20 AM 01/09/2023    2:03 AM  Hepatic Function  Total Protein 6.0 - 8.5 g/dL 6.9  6.9  6.7   Albumin 3.9 - 4.9 g/dL 4.4  4.2  3.7   AST 0 - 40 IU/L  21  15  21    ALT 0 - 32 IU/L 20  15  20    Alk Phosphatase 44 - 121 IU/L 113  97  76   Total Bilirubin 0.0 - 1.2 mg/dL 0.3  0.3  0.5        Latest Ref Rng & Units 01/09/2023    2:03 AM 09/25/2021    8:45 AM 05/06/2021    9:23 AM  CBC  WBC 4.0 - 10.5 K/uL 4.7  4.5  4.6   Hemoglobin 12.0 - 15.0 g/dL 40.9  81.1  91.4   Hematocrit 36.0 - 46.0 % 43.7  42.3  42.1   Platelets 150 - 400 K/uL 226  250  221    Lab Results  Component Value Date   MCV 74.7 (L) 01/09/2023   MCV 75 (L) 09/25/2021   MCV 74 (L) 05/06/2021   Lab Results  Component Value Date   TSH 1.943 01/09/2023   Lab Results  Component Value Date   HGBA1C 6.0  08/02/2023     BNP No results found for: "BNP"  ProBNP No results found for: "PROBNP"   Lipid Panel     Component Value Date/Time   CHOL 222 (H) 06/02/2023 0933   TRIG 75 06/02/2023 0933   HDL 60 06/02/2023 0933   CHOLHDL 3.7 06/02/2023 0933   CHOLHDL 3.4 05/17/2016 1004   VLDL 18 05/17/2016 1004   LDLCALC 149 (H) 06/02/2023 0933   LABVLDL 13 06/02/2023 0933     RADIOLOGY: No results found.   Additional studies/ records that were reviewed today include:   ECHO 10/11/2018 IMPRESSIONS  1. The left ventricle has normal systolic function, with an ejection fraction of 55-60%. The cavity size was normal. There is mildly increased left ventricular wall thickness. Left ventricular diastolic Doppler parameters are consistent with impaired  relaxation. No evidence of left ventricular regional wall motion abnormalities.  2. The right ventricle has normal systolic function. The cavity was normal. There is no increase in right ventricular wall thickness.  3. There is mild mitral annular calcification present. Trivial mitral regurgitation.  4. The aortic valve is tricuspid. No stenosis of the aortic valve.  5. There is mild dilatation of the aortic root measuring 39 mm.  6. Normal IVC size. PA systolic pressure 21 mmHg.  ASSESSMENT:    1. Essential hypertension   2. History of palpitations   3. Bundle branch block, right   4. 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 Hyperlipidemia with target low density lipoprotein (LDL) cholesterol less than 50 mg/dL   6. Mild obesity     PLAN:  1.  Essential hypertension: Blood pressure today is stable on her current regimen with increase metoprolol  50 mg daily and HCTZ 12.5 mg.  On increased beta-blocker, PVCs are no longer present.  2.  Palpitations: PVCs have significantly resolved with the increase metoprolol  to 50 mg daily.  3.  Grade 1 diastolic dysfunction: Noted on prior echo in 2020.  4.  Right bundle branch block: Stable  5.  Previous  hyperthyroidism with over suppression of TSH for which she was started on methimazole .  TSH is 1.943 on January 09, 2023.  6.  Obesity: BMI 34.28.  She has lost 3 pounds since her last evaluation.  I again encouraged improved diet and exercise and again discussed American Heart Association recommendations of exercise at least 5 days of the week for 30 minutes if at all possible.  7.  Hyperlipidemia: Remotely, she had significant lipid elevation  in December 2022 with total cholesterol 242, triglycerides 58 and LDL 167.  At that time she had stopped taking her atorvastatin .  Since her last office visit she has been on rosuvastatin  20 mg and Zetia  10 mg.  LP(a) is significantly elevated at 235.  Target LDL is less than 50.  She will need repeat laboratory with comprehensive metabolic panel, and fasting lipid panel.  Medication adjustment will be necessary.  She may benefit from the addition of PCSK9 in addition which has been associated with 26 to 30% LP(a) reduction.  8. COVID-19 infection: December 2020 treated with steroids, Zithromax , and received bamlanivimab  infusion therapy with marked resolution of symptoms.  I discussed with her my retirement in several months.  I will transition her to the cardiology care of Dr. Kardie Tobb to be seen in 6 months or sooner as needed.  Medication Adjustments/Labs and Tests Ordered: Current medicines are reviewed at length with the patient today.  Concerns regarding medicines are outlined above.  Medication changes, Labs and Tests ordered today are listed in the Patient Instructions below. Patient Instructions  Medication Instructions:  Your physician recommends that you continue on your current medications as directed. Please refer to the Current Medication list given to you today.    *If you need a refill on your cardiac medications before your next appointment, please call your pharmacy*   Lab Work: Your physician recommends that you return for lab work  in 1 WEEK FOR  CMET  LIPID PANEL   If you have labs (blood work) drawn today and your tests are completely normal, you will receive your results only by: MyChart Message (if you have MyChart) OR A paper copy in the mail If you have any lab test that is abnormal or we need to change your treatment, we will call you to review the results.   Testing/Procedures: NONE    Follow-Up: At [facility], you and your health needs are our priority.  As part of our continuing mission to provide you with exceptional heart care, we have created designated Provider Care Teams.  These Care Teams include your primary Cardiologist (physician) and Advanced Practice Providers (APPs -  Physician Assistants and Nurse Practitioners) who all work together to provide you with the care you need, when you need it.  We recommend signing up for the patient portal called "MyChart".  Sign up information is provided on this After Visit Summary.  MyChart is used to connect with patients for Virtual Visits (Telemedicine).  Patients are able to view lab/test results, encounter notes, upcoming appointments, etc.  Non-urgent messages can be sent to your provider as well.   To learn more about what you can do with MyChart, go to [email or web address].    Your next appointment:   6 month(s)  The format for your next appointment:   In Person  Provider:   Jerryl Morin, MD    Other Instructions      Signed, Magnus Schuller, MD  09/02/2023 8:09 PM    [facility] [address], [place], [place]  [address] Phone: [phone]

## 2023-09-02 ENCOUNTER — Encounter: Payer: Self-pay | Admitting: Cardiovascular Disease

## 2023-09-05 DIAGNOSIS — I4510 Unspecified right bundle-branch block: Secondary | ICD-10-CM | POA: Diagnosis not present

## 2023-09-05 DIAGNOSIS — E785 Hyperlipidemia, unspecified: Secondary | ICD-10-CM | POA: Diagnosis not present

## 2023-09-05 LAB — LIPID PANEL
Chol/HDL Ratio: 3 ratio (ref 0.0–4.4)
Cholesterol, Total: 169 mg/dL (ref 100–199)
HDL: 57 mg/dL (ref >39)
LDL Chol Calc (NIH): 92 mg/dL (ref 0–99)
Triglycerides: 112 mg/dL (ref 0–149)
VLDL Cholesterol Cal: 20 mg/dL (ref 5–40)

## 2023-09-05 LAB — COMPREHENSIVE METABOLIC PANEL WITH GFR
ALT: 19 IU/L (ref 0–32)
AST: 21 IU/L (ref 0–40)
Albumin: 4.4 g/dL (ref 3.9–4.9)
Alkaline Phosphatase: 93 IU/L (ref 44–121)
BUN/Creatinine Ratio: 10 — ABNORMAL LOW (ref 12–28)
BUN: 9 mg/dL (ref 8–27)
Bilirubin Total: 0.3 mg/dL (ref 0.0–1.2)
CO2: 23 mmol/L (ref 20–29)
Calcium: 9.7 mg/dL (ref 8.7–10.3)
Chloride: 105 mmol/L (ref 96–106)
Creatinine, Ser: 0.89 mg/dL (ref 0.57–1.00)
Globulin, Total: 2.5 g/dL (ref 1.5–4.5)
Glucose: 94 mg/dL (ref 70–99)
Potassium: 4.4 mmol/L (ref 3.5–5.2)
Sodium: 142 mmol/L (ref 134–144)
Total Protein: 6.9 g/dL (ref 6.0–8.5)
eGFR: 70 mL/min/{1.73_m2} (ref >59)

## 2023-09-06 ENCOUNTER — Telehealth: Payer: Self-pay | Admitting: Cardiovascular Disease

## 2023-09-06 NOTE — Telephone Encounter (Signed)
 Follow Up:      Patient says she need someone to please  call and explain her  lab results

## 2023-09-06 NOTE — Telephone Encounter (Signed)
 Gave patient her results over the phone. Informed her that Dr Loetta Ringer has not reviewed the labs yet. We will get in touch with her if he recommends any changes.  She verbalized understanding.

## 2023-09-13 ENCOUNTER — Other Ambulatory Visit: Payer: Self-pay

## 2023-09-14 ENCOUNTER — Other Ambulatory Visit: Payer: Self-pay

## 2023-09-28 DIAGNOSIS — H26492 Other secondary cataract, left eye: Secondary | ICD-10-CM | POA: Diagnosis not present

## 2023-10-10 ENCOUNTER — Telehealth: Payer: Self-pay

## 2023-10-10 ENCOUNTER — Other Ambulatory Visit: Payer: Self-pay

## 2023-10-10 NOTE — Telephone Encounter (Signed)
 Called  re: PREP program referral, left voicemail requesting return call.

## 2023-10-10 NOTE — Telephone Encounter (Signed)
 Returned her call; she uses bus for transportation so will need [employer], will contact in July once classes resume there.

## 2023-10-18 ENCOUNTER — Ambulatory Visit: Attending: Family Medicine

## 2023-10-18 VITALS — Ht 66.0 in | Wt 207.0 lb

## 2023-10-18 DIAGNOSIS — Z0000 Encounter for general adult medical examination without abnormal findings: Secondary | ICD-10-CM | POA: Diagnosis not present

## 2023-10-18 NOTE — Patient Instructions (Signed)
 Kayla Preston , Thank you for taking time out of your busy schedule to complete your Annual Wellness Visit with me. I enjoyed our conversation and look forward to speaking with you again next year. I, as well as your care team,  appreciate your ongoing commitment to your health goals. Please review the following plan we discussed and let me know if I can assist you in the future. Your Game plan/ To Do List    Referrals: If you haven't heard from the office you've been referred to, please reach out to them at the phone provided.   Follow up Visits: Next Medicare AWV with our clinical staff: 10/23/2024 at 8:30 a.m. Phone Visit with Nurse [place]   Have you seen your provider in the last 6 months (3 months if uncontrolled diabetes)? Yes Next Office Visit with your provider: 02/02/2024 at 8:30 a.m. Office Visit with Dr. Adan Holms  Clinician Recommendations:  Aim for 30 minutes of exercise or brisk walking, 6-8 glasses of water, and 5 servings of fruits and vegetables each day.       This is a list of the screening recommended for you and due dates:  Health Maintenance  Topic Date Due   COVID-19 Vaccine (7 - 2024-25 season) 01/09/2023   Flu Shot  12/09/2023   Medicare Annual Wellness Visit  10/17/2024   Colon Cancer Screening  11/29/2024   Mammogram  01/03/2025   Pneumonia Vaccine  Completed   DEXA scan (bone density measurement)  Completed   Hepatitis C Screening  Completed   Zoster (Shingles) Vaccine  Completed   HPV Vaccine  Aged Out   Meningitis B Vaccine  Aged Out   DTaP/Tdap/Td vaccine  Discontinued    Advanced directives: (Declined) Advance directive discussed with you today. Even though you declined this today, please call our office should you change your mind, and we can give you the proper paperwork for you to fill out. Advance Care Planning is important because it:  [x]  Makes sure you receive the medical care that is consistent with your values, goals, and preferences  [x]  It  provides guidance to your family and loved ones and reduces their decisional burden about whether or not they are making the right decisions based on your wishes.  Follow the link provided in your after visit summary or read over the paperwork we have mailed to you to help you started getting your Advance Directives in place. If you need assistance in completing these, please reach out to us  so that we can help you!  See attachments for Preventive Care and Fall Prevention Tips.

## 2023-10-18 NOTE — Progress Notes (Signed)
 Because this visit was a virtual/telehealth visit,  certain criteria was not obtained, such a blood pressure, CBG if applicable, and timed get up and go. Any medications not marked as "taking" were not mentioned during the medication reconciliation part of the visit. Any vitals not documented were not able to be obtained due to this being a telehealth visit or patient was unable to self-report a recent blood pressure reading due to a lack of equipment at home via telehealth. Vitals that have been documented are verbally provided by the patient.   Subjective:   Kayla Preston is a 73 y.o. who presents for a Medicare Wellness preventive visit.  As a reminder, Annual Wellness Visits don't include a physical exam, and some assessments may be limited, especially if this visit is performed virtually. We may recommend an in-person follow-up visit with your provider if needed.  Visit Complete: Virtual I connected with  Marton Sleeper on 10/18/23 by a audio enabled telemedicine application and verified that I am speaking with the correct person using two identifiers.  Patient Location: Home  Provider Location: Office/Clinic  I discussed the limitations of evaluation and management by telemedicine. The patient expressed understanding and agreed to proceed.  Vital Signs: Because this visit was a virtual/telehealth visit, some criteria may be missing or patient reported. Any vitals not documented were not able to be obtained and vitals that have been documented are patient reported.  VideoDeclined- This patient declined Librarian, academic. Therefore the visit was completed with audio only.  Persons Participating in Visit: Patient.  AWV Questionnaire: No: Patient Medicare AWV questionnaire was not completed prior to this visit.  Cardiac Risk Factors include: advanced age (>41men, >69 women);dyslipidemia;family history of premature cardiovascular  disease;hypertension;obesity (BMI >30kg/m2)     Objective:     Today's Vitals   10/18/23 0835  Weight: 207 lb (93.9 kg)  Height: 5\' 6"  (1.676 m)  PainSc: 0-No pain   Body mass index is 33.41 kg/m.     10/18/2023    8:37 AM 01/09/2023    1:44 AM 09/28/2022    9:22 AM 04/15/2022    6:52 AM 03/11/2021    3:31 PM 08/13/2020    6:01 PM 03/28/2020    9:19 AM  Advanced Directives  Does Patient Have a Medical Advance Directive? No No No No No No No  Would patient like information on creating a medical advance directive? No - Patient declined  Yes (MAU/Ambulatory/Procedural Areas - Information given)    No - Patient declined    Current Medications (verified) Outpatient Encounter Medications as of 10/18/2023  Medication Sig   albuterol  (PROAIR  HFA) 108 (90 Base) MCG/ACT inhaler Inhale 2 puffs into the lungs every 6 (six) hours as needed.   aspirin EC 81 MG tablet Take 81 mg by mouth daily.   ezetimibe  (ZETIA ) 10 MG tablet Take 1 tablet (10 mg total) by mouth daily.   hydrochlorothiazide  (HYDRODIURIL ) 25 MG tablet Take 0.5 tablets (12.5 mg total) by mouth every other day.(Keep upcoming appointment for further refills)   linaclotide  (LINZESS ) 145 MCG CAPS capsule TAKE 1 CAPSULE BY MOUTH AT LEAST 30 MINUTES BEFORE THE FIRST MEAL OF THE DAY ON AN EMPTY STOMACH.   metoprolol  succinate (TOPROL -XL) 50 MG 24 hr tablet Take 1 tablet (50 mg total) by mouth daily.   rosuvastatin  (CRESTOR ) 20 MG tablet Take 1 tablet (20 mg total) by mouth daily.   No facility-administered encounter medications on file as of 10/18/2023.    Allergies (  verified) Patient has no known allergies.   History: Past Medical History:  Diagnosis Date   Atrial fibrillation (HCC)    Atrial fibrillation (HCC)    she had ablation which resolved intermittent afib   Hypertension    Obesity    Seasonal allergies    Past Surgical History:  Procedure Laterality Date   ABLATION     Family History  Problem Relation Age of  Onset   Heart disease Mother    Heart disease Father    Hyperlipidemia Sister    Thyroid  disease Neg Hx    Social History   Socioeconomic History   Marital status: Single    Spouse name: Not on file   Number of children: Not on file   Years of education: Not on file   Highest education level: Not on file  Occupational History   Not on file  Tobacco Use   Smoking status: Never   Smokeless tobacco: Never  Substance and Sexual Activity   Alcohol use: No   Drug use: No   Sexual activity: Not Currently  Other Topics Concern   Not on file  Social History Narrative   ** Merged History Encounter **       Social Drivers of Health   Financial Resource Strain: Low Risk  (10/18/2023)   Overall Financial Resource Strain (CARDIA)    Difficulty of Paying Living Expenses: Not hard at all  Food Insecurity: No Food Insecurity (10/18/2023)   Hunger Vital Sign    Worried About Running Out of Food in the Last Year: Never true    Ran Out of Food in the Last Year: Never true  Transportation Needs: No Transportation Needs (10/18/2023)   PRAPARE - Administrator, Civil Service (Medical): No    Lack of Transportation (Non-Medical): No  Physical Activity: Sufficiently Active (10/18/2023)   Exercise Vital Sign    Days of Exercise per Week: 5 days    Minutes of Exercise per Session: 30 min  Stress: No Stress Concern Present (10/18/2023)   [employer] of Occupational Health - Occupational Stress Questionnaire    Feeling of Stress : Not at all  Social Connections: Unknown (10/18/2023)   Social Connection and Isolation Panel [NHANES]    Frequency of Communication with Friends and Family: More than three times a week    Frequency of Social Gatherings with Friends and Family: Three times a week    Attends Religious Services: More than 4 times per year    Active Member of Clubs or Organizations: No    Attends Banker Meetings: Never    Marital Status: Patient declined     Tobacco Counseling Counseling given: Not Answered    Clinical Intake:  Pre-visit preparation completed: Yes  Pain : No/denies pain Pain Score: 0-No pain     BMI - recorded: 33.41 Nutritional Status: BMI > 30  Obese Nutritional Risks: None Diabetes: No  Lab Results  Component Value Date   HGBA1C 6.0 08/02/2023   HGBA1C 6.3 (H) 01/31/2023   HGBA1C 6.0 07/26/2022     How often do you need to have someone help you when you read instructions, pamphlets, or other written materials from your doctor or pharmacy?: 1 - Never  Interpreter Needed?: No  Information entered by :: Amelita Risinger N. Cortne Amara, LPN.   Activities of Daily Living     10/18/2023    8:40 AM  In your present state of health, do you have any difficulty performing the following activities:  Hearing? 0  Vision? 0  Difficulty concentrating or making decisions? 0  Walking or climbing stairs? 0  Dressing or bathing? 0  Doing errands, shopping? 0  Preparing Food and eating ? N  Using the Toilet? N  In the past six months, have you accidently leaked urine? N  Do you have problems with loss of bowel control? N  Managing your Medications? N  Managing your Finances? N  Housekeeping or managing your Housekeeping? N    Patient Care Team: Joaquin Mulberry, MD as PCP - General (Family Medicine) Millicent Ally, MD as PCP - Cardiology (Cardiology) [facility]  I have updated your Care Teams any recent Medical Services you may have received from other providers in the past year.     Assessment:    This is a routine wellness examination for Kayla Preston.  Hearing/Vision screen Hearing Screening - Comments:: Denies hearing difficulties.  Vision Screening - Comments:: Wears rx glasses - up to date with routine eye exams with [facility]    Goals Addressed             This Visit's Progress    10/18/2023: My goal is to get my cholesterol down by eating healthier foods, increasing physical  activity, drinking water, losing weight and eliminate taking statin drugs.         Depression Screen     10/18/2023    8:42 AM 08/02/2023    8:42 AM 01/31/2023    8:44 AM 09/28/2022    9:20 AM 07/26/2022    9:57 AM 04/26/2022    2:40 PM 11/04/2021    8:42 AM  PHQ 2/9 Scores  PHQ - 2 Score 0 0 0 0 0 0 0  PHQ- 9 Score 0 0 0  0 0 0    Fall Risk     10/18/2023    8:38 AM 08/02/2023    8:42 AM 01/31/2023    8:44 AM 09/28/2022    9:22 AM 07/26/2022    9:56 AM  Fall Risk   Falls in the past year? 0 0 0 0 0  Number falls in past yr: 0 0 0 0 0  Injury with Fall? 0 0 0 0 0  Risk for fall due to : No Fall Risks No Fall Risks No Fall Risks No Fall Risks   Follow up Falls evaluation completed Falls evaluation completed  Falls prevention discussed;Education provided;Falls evaluation completed     MEDICARE RISK AT HOME:  Medicare Risk at Home Any stairs in or around the home?: No If so, are there any without handrails?: No Home free of loose throw rugs in walkways, pet beds, electrical cords, etc?: Yes Adequate lighting in your home to reduce risk of falls?: Yes Life alert?: No Use of a cane, walker or w/c?: No Grab bars in the bathroom?: Yes Shower chair or bench in shower?: No Elevated toilet seat or a handicapped toilet?: No  TIMED UP AND GO:  Was the test performed?  No  Cognitive Function: 6CIT completed    10/18/2023    8:44 AM  MMSE - Mini Mental State Exam  Not completed: Unable to complete        10/18/2023    8:39 AM 09/28/2022    9:22 AM  6CIT Screen  What Year? 0 points 0 points  What month? 0 points 0 points  What time? 0 points 0 points  Count back from 20 0 points 0 points  Months in reverse 0  points 0 points  Repeat phrase 0 points 0 points  Total Score 0 points 0 points    Immunizations Immunization History  Administered Date(s) Administered   Fluad Trivalent(High Dose 65+) 01/31/2023   Influenza,inj,Quad PF,6+ Mos 06/03/2016, 06/15/2017, 01/11/2018,  02/06/2019, 05/15/2020, 03/11/2021   Moderna Covid-19 Vaccine  Bivalent Booster 11yrs & up 01/06/2022   PFIZER(Purple Top)SARS-COV-2 Vaccination 07/03/2019, 07/24/2019, 02/12/2020, 09/09/2020   Pfizer Covid-19 Vaccine Bivalent Booster 95yrs & up 05/29/2022   Pneumococcal Conjugate-13 03/13/2019   Pneumococcal Polysaccharide-23 05/15/2020   RSV,unspecified 06/03/2022   Zoster Recombinant(Shingrix ) 01/07/2021, 03/11/2021    Screening Tests Health Maintenance  Topic Date Due   COVID-19 Vaccine (7 - 2024-25 season) 01/09/2023   INFLUENZA VACCINE  12/09/2023   Medicare Annual Wellness (AWV)  10/17/2024   Colonoscopy  11/29/2024   MAMMOGRAM  01/03/2025   Pneumonia Vaccine 62+ Years old  Completed   DEXA SCAN  Completed   Hepatitis C Screening  Completed   Zoster Vaccines- Shingrix   Completed   HPV VACCINES  Aged Out   Meningococcal B Vaccine  Aged Out   DTaP/Tdap/Td  Discontinued    Health Maintenance  Health Maintenance Due  Topic Date Due   COVID-19 Vaccine (7 - 2024-25 season) 01/09/2023   Health Maintenance Items Addressed: Yes Patient aware of current care gaps.  Immunization record was verified by [facility] and updated in patient's chart.  Additional Screening:  Vision Screening: Recommended annual ophthalmology exams for early detection of glaucoma and other disorders of the eye. Would you like a referral to an eye doctor? No    Dental Screening: Recommended annual dental exams for proper oral hygiene  Community Resource Referral / Chronic Care Management: CRR required this visit?  Yes   CCM required this visit?  No   Plan:    I have personally reviewed and noted the following in the patient's chart:   Medical and social history Use of alcohol, tobacco or illicit drugs  Current medications and supplements including opioid prescriptions. Patient is not currently taking opioid prescriptions. Functional ability and status Nutritional status Physical  activity Advanced directives List of other physicians Hospitalizations, surgeries, and ER visits in previous 12 months Vitals Screenings to include cognitive, depression, and falls Referrals and appointments  In addition, I have reviewed and discussed with patient certain preventive protocols, quality metrics, and best practice recommendations. A written personalized care plan for preventive services as well as general preventive health recommendations were provided to patient.   Margette Sheldon, LPN   8/65/7846   After Visit Summary: (MyChart) Due to this being a telephonic visit, the after visit summary with patients personalized plan was offered to patient via MyChart   Notes: Patient aware of current care gaps.  Immunization record was verified by [facility] and updated in patient's chart.

## 2023-11-07 ENCOUNTER — Telehealth: Payer: Self-pay

## 2023-11-07 NOTE — Telephone Encounter (Signed)
 Left voicemail about next class starting at [facility] in July, asked her to return call.

## 2023-11-09 ENCOUNTER — Telehealth: Payer: Self-pay

## 2023-11-09 NOTE — Telephone Encounter (Signed)
 Spoke with Amyra and she would like to join in the July 15th PREP class. Will meet with her next week for the initial assessment.

## 2023-11-14 ENCOUNTER — Other Ambulatory Visit: Payer: Self-pay

## 2023-11-15 ENCOUNTER — Other Ambulatory Visit: Payer: Self-pay

## 2023-11-22 ENCOUNTER — Other Ambulatory Visit: Payer: Self-pay

## 2023-11-28 ENCOUNTER — Other Ambulatory Visit: Payer: Self-pay

## 2023-11-28 ENCOUNTER — Telehealth: Payer: Self-pay | Admitting: Family Medicine

## 2023-11-28 NOTE — Telephone Encounter (Signed)
 FYI only.

## 2023-11-28 NOTE — Telephone Encounter (Signed)
 Pt came in the office requesting the nurse or PCP to contact her regarding swollen hand after 2 yellow jacket sting.

## 2023-12-02 ENCOUNTER — Encounter: Payer: Self-pay | Admitting: Family Medicine

## 2023-12-02 DIAGNOSIS — Z1231 Encounter for screening mammogram for malignant neoplasm of breast: Secondary | ICD-10-CM

## 2023-12-12 ENCOUNTER — Other Ambulatory Visit: Payer: Self-pay

## 2023-12-15 ENCOUNTER — Other Ambulatory Visit: Payer: Self-pay

## 2023-12-16 ENCOUNTER — Encounter (HOSPITAL_COMMUNITY): Payer: Self-pay | Admitting: *Deleted

## 2023-12-16 ENCOUNTER — Other Ambulatory Visit: Payer: Self-pay

## 2023-12-16 ENCOUNTER — Emergency Department (HOSPITAL_COMMUNITY)
Admission: EM | Admit: 2023-12-16 | Discharge: 2023-12-16 | Disposition: A | Discharge status: Home / Self Care | Attending: Emergency Medicine | Admitting: Emergency Medicine

## 2023-12-16 DIAGNOSIS — Z7982 Long term (current) use of aspirin: Secondary | ICD-10-CM | POA: Diagnosis not present

## 2023-12-16 DIAGNOSIS — L03011 Cellulitis of right finger: Secondary | ICD-10-CM | POA: Diagnosis not present

## 2023-12-16 DIAGNOSIS — M79644 Pain in right finger(s): Secondary | ICD-10-CM | POA: Diagnosis present

## 2023-12-16 DIAGNOSIS — Z79899 Other long term (current) drug therapy: Secondary | ICD-10-CM | POA: Diagnosis not present

## 2023-12-16 DIAGNOSIS — I10 Essential (primary) hypertension: Secondary | ICD-10-CM | POA: Insufficient documentation

## 2023-12-16 MED ORDER — DOXYCYCLINE HYCLATE 100 MG PO CAPS
100.0000 mg | ORAL_CAPSULE | Freq: Two times a day (BID) | ORAL | 0 refills | Status: DC
  Filled 2023-12-16: qty 20, 10d supply, fill #0

## 2023-12-16 MED ORDER — LIDOCAINE HCL (PF) 1 % IJ SOLN
10.0000 mL | Freq: Once | INTRAMUSCULAR | Status: AC
  Administered 2023-12-16: 10 mL
  Filled 2023-12-16: qty 10

## 2023-12-16 NOTE — Discharge Instructions (Addendum)
 Please keep dressing dry for the next 2 days and then after that remove dressing, clean the wound with gentle soap and water, pat it dry, and redress it until it is completely healed.  Take antibiotic as prescribed.  You may take over-the-counter Tylenol  or ibuprofen  as needed for pain.  Return if you have any concern.

## 2023-12-16 NOTE — ED Notes (Signed)
Pt verbalized understanding of discharge instructions. Pt ambulated from ed with steady gait.  

## 2023-12-16 NOTE — ED Provider Notes (Signed)
 [place] EMERGENCY DEPARTMENT AT [facility] Provider Note   CSN: [id number] Arrival date & time: 12/16/23  9392     Patient presents with: Finger Injury   Kayla Preston is a 73 y.o. female.   The history is provided by the patient and medical records. No language interpreter was used.     73 year old female with history of A-fib not on any anticoagulant, hypertension presenting with complaint of finger pain.  Patient states for the past several days she has noticed increasing pain and swelling to her right index finger.  She states she works out in the garden a lot and she has been pulling okra and she did nicked her finger at 1 point.  She has been trying to soak with water but the throbbing sensation has persisted.  She denies any numbness.  She is right-hand dominant.  She is up-to-date with tetanus.  Prior to Admission medications   Medication Sig Start Date End Date Taking? Authorizing Provider  albuterol  (PROAIR  HFA) 108 (90 Base) MCG/ACT inhaler Inhale 2 puffs into the lungs every 6 (six) hours as needed. 09/20/22   Newlin, Enobong, MD  aspirin EC 81 MG tablet Take 81 mg by mouth daily.    [provider]  ezetimibe  (ZETIA ) 10 MG tablet Take 1 tablet (10 mg total) by mouth daily. 08/02/23   Newlin, Enobong, MD  hydrochlorothiazide  (HYDRODIURIL ) 25 MG tablet Take 0.5 tablets (12.5 mg total) by mouth every other day.(Keep upcoming appointment for further refills) 01/31/23   Newlin, Enobong, MD  linaclotide  (LINZESS ) 145 MCG CAPS capsule TAKE 1 CAPSULE BY MOUTH AT LEAST 30 MINUTES BEFORE THE FIRST MEAL OF THE DAY ON AN EMPTY STOMACH. 08/02/23   Newlin, Enobong, MD  metoprolol  succinate (TOPROL -XL) 50 MG 24 hr tablet Take 1 tablet (50 mg total) by mouth daily. 08/02/23   Newlin, Enobong, MD  rosuvastatin  (CRESTOR ) 20 MG tablet Take 1 tablet (20 mg total) by mouth daily. 03/01/23   Burnard Debby LABOR, MD    Allergies: Patient has no known allergies.    Review of  Systems  Constitutional:  Negative for fever.  Skin:  Positive for wound.    Updated Vital Signs BP (!) 175/83 (BP Location: Right Arm)   Pulse 81   Temp 98.3 F (36.8 C)   Resp 16   Ht 5' 6 (1.676 m)   Wt 95.3 kg   SpO2 96%   BMI 33.89 kg/m   Physical Exam Vitals and nursing note reviewed.  Constitutional:      General: She is not in acute distress.    Appearance: She is well-developed.  HENT:     Head: Atraumatic.  Eyes:     Conjunctiva/sclera: Conjunctivae normal.  Pulmonary:     Effort: Pulmonary effort is normal.  Musculoskeletal:        General: Tenderness (R index finger: tenderness and swelling noted to medial nail fold consistent with a paronychia.  no nail involvement. brisk cap refill) present.     Cervical back: Neck supple.  Skin:    Findings: No rash.  Neurological:     Mental Status: She is alert.  Psychiatric:        Mood and Affect: Mood normal.     (all labs ordered are listed, but only abnormal results are displayed) Labs Reviewed - No data to display  EKG: None  Radiology: No results found.   .Incision and Drainage  Date/Time: 12/16/2023 7:41 AM  Performed by: Nivia Colon, PA-C Authorized  by: Nivia Colon, PA-C   Consent:    Consent obtained:  Verbal   Consent given by:  Patient   Risks discussed:  Bleeding, incomplete drainage, pain and damage to other organs   Alternatives discussed:  No treatment Universal protocol:    Procedure explained and questions answered to patient or proxy's satisfaction: yes     Relevant documents present and verified: yes     Test results available : yes     Imaging studies available: yes     Required blood products, implants, devices, and special equipment available: yes     Site/side marked: yes     Immediately prior to procedure, a time out was called: yes     Patient identity confirmed:  Verbally with patient Location:    Type:  Abscess   Size:  1   Location:  Upper extremity   Upper  extremity location:  Finger   Finger location:  R index finger Pre-procedure details:    Skin preparation:  Betadine Anesthesia:    Anesthesia method:  Nerve block   Block location:  Digital   Block needle gauge:  25 G   Block anesthetic:  Lidocaine  1% w/o epi   Block injection procedure:  Anatomic landmarks identified, introduced needle and negative aspiration for blood   Block outcome:  Anesthesia achieved Procedure type:    Complexity:  Complex Procedure details:    Incision types:  Single straight   Incision depth:  Subcutaneous   Wound management:  Probed and deloculated, irrigated with saline and extensive cleaning   Drainage:  Purulent   Drainage amount:  Moderate   Packing materials:  None Post-procedure details:    Procedure completion:  Tolerated well, no immediate complications    Medications Ordered in the ED  lidocaine  (PF) (XYLOCAINE ) 1 % injection 10 mL (has no administration in time range)                                    Medical Decision Making Risk Prescription drug management.   BP (!) 175/83 (BP Location: Right Arm)   Pulse 81   Temp 98.3 F (36.8 C)   Resp 16   Ht 5' 6 (1.676 m)   Wt 95.3 kg   SpO2 96%   BMI 33.89 kg/m   49:82 AM 73 year old female with history of A-fib not on any anticoagulant, hypertension presenting with complaint of finger pain.  Patient states for the past several days she has noticed increasing pain and swelling to her right index finger.  She states she works out in the garden a lot and she has been pulling okra and she did nicked her finger at 1 point.  She has been trying to soak with water but the throbbing sensation has persisted.  She denies any numbness.  She is right-hand dominant.  She is up-to-date with tetanus.  Exam notable for tenderness to the medial cuticle nail fold of the right index finger consistent with a paronychia.  No evidence of felon.  No nail involvement.  Patient is neurovascular tact.  No  obvious deformity of the bone or the joint.  X-ray not considered as I have low suspicion for bony injury.  Doubt septic joint.  Successful incision and drainage of the paronychia with moderate amount of purulent discharge.  Wound was dressed, will discharge home with antibiotic and wound care instruction.  Return precaution given. CAre discussed with  DR. Francesca.      Final diagnoses:  Paronychia of finger of right hand    ED Discharge Orders          Ordered    doxycycline  (VIBRAMYCIN ) 100 MG capsule  2 times daily        12/16/23 0743               Nivia Colon, PA-C 12/16/23 0744    Francesca Elsie CROME, MD 12/16/23 [phone]

## 2023-12-16 NOTE — ED Triage Notes (Signed)
 States she was working in her garden yest and last night started having pain in her right index finger, infected hang nail, c/o increased pain

## 2023-12-21 ENCOUNTER — Other Ambulatory Visit: Payer: Self-pay

## 2023-12-22 ENCOUNTER — Telehealth: Payer: Self-pay

## 2023-12-22 NOTE — Telephone Encounter (Signed)
 Copied from CRM [id number]. Topic: Appointments - Appointment Cancel/Reschedule >> Dec 22, 2023 10:29 AM Kayla Preston wrote: Patient wants to have her ER follow up at [facility] not at [facility]- please call [phone]

## 2023-12-23 NOTE — Telephone Encounter (Signed)
 Noted

## 2023-12-29 ENCOUNTER — Inpatient Hospital Stay: Admitting: Family

## 2024-01-10 ENCOUNTER — Other Ambulatory Visit: Payer: Self-pay

## 2024-02-02 ENCOUNTER — Other Ambulatory Visit: Payer: Self-pay

## 2024-02-02 ENCOUNTER — Ambulatory Visit: Attending: Family Medicine | Admitting: Family Medicine

## 2024-02-02 ENCOUNTER — Encounter: Payer: Self-pay | Admitting: Family Medicine

## 2024-02-02 VITALS — BP 129/79 | HR 76 | Ht 66.0 in | Wt 216.2 lb

## 2024-02-02 DIAGNOSIS — L03011 Cellulitis of right finger: Secondary | ICD-10-CM | POA: Diagnosis not present

## 2024-02-02 DIAGNOSIS — E785 Hyperlipidemia, unspecified: Secondary | ICD-10-CM | POA: Insufficient documentation

## 2024-02-02 DIAGNOSIS — K5900 Constipation, unspecified: Secondary | ICD-10-CM | POA: Diagnosis not present

## 2024-02-02 DIAGNOSIS — K5909 Other constipation: Secondary | ICD-10-CM

## 2024-02-02 DIAGNOSIS — Z79899 Other long term (current) drug therapy: Secondary | ICD-10-CM | POA: Diagnosis not present

## 2024-02-02 DIAGNOSIS — I10 Essential (primary) hypertension: Secondary | ICD-10-CM | POA: Insufficient documentation

## 2024-02-02 DIAGNOSIS — R7303 Prediabetes: Secondary | ICD-10-CM | POA: Diagnosis not present

## 2024-02-02 DIAGNOSIS — E669 Obesity, unspecified: Secondary | ICD-10-CM | POA: Diagnosis not present

## 2024-02-02 DIAGNOSIS — Z23 Encounter for immunization: Secondary | ICD-10-CM

## 2024-02-02 DIAGNOSIS — E7800 Pure hypercholesterolemia, unspecified: Secondary | ICD-10-CM

## 2024-02-02 LAB — POCT GLYCOSYLATED HEMOGLOBIN (HGB A1C): HbA1c, POC (prediabetic range): 6 % (ref 5.7–6.4)

## 2024-02-02 MED ORDER — EZETIMIBE 10 MG PO TABS
10.0000 mg | ORAL_TABLET | Freq: Every day | ORAL | 1 refills | Status: AC
  Filled 2024-02-02 – 2024-02-22 (×2): qty 90, 90d supply, fill #0
  Filled 2024-05-22: qty 90, 90d supply, fill #1

## 2024-02-02 MED ORDER — ROSUVASTATIN CALCIUM 20 MG PO TABS
20.0000 mg | ORAL_TABLET | Freq: Every day | ORAL | 1 refills | Status: AC
  Filled 2024-02-02 – 2024-03-23 (×4): qty 90, 90d supply, fill #0
  Filled ????-??-??: fill #1

## 2024-02-02 MED ORDER — METOPROLOL SUCCINATE ER 50 MG PO TB24
50.0000 mg | ORAL_TABLET | Freq: Every day | ORAL | 1 refills | Status: AC
  Filled 2024-02-02 – 2024-04-10 (×2): qty 90, 90d supply, fill #0

## 2024-02-02 MED ORDER — LINACLOTIDE 145 MCG PO CAPS
145.0000 ug | ORAL_CAPSULE | Freq: Every day | ORAL | 1 refills | Status: AC
Start: 2024-02-02 — End: ?
  Filled 2024-02-02 – 2024-02-08 (×3): qty 90, 90d supply, fill #0
  Filled 2024-04-18 – 2024-04-23 (×2): qty 90, 90d supply, fill #1

## 2024-02-02 MED ORDER — DOXYCYCLINE HYCLATE 100 MG PO CAPS
100.0000 mg | ORAL_CAPSULE | Freq: Two times a day (BID) | ORAL | 0 refills | Status: AC
  Filled 2024-02-02: qty 20, 10d supply, fill #0

## 2024-02-02 MED ORDER — HYDROCHLOROTHIAZIDE 25 MG PO TABS
12.5000 mg | ORAL_TABLET | ORAL | 1 refills | Status: AC
  Filled 2024-02-02: qty 45, 180d supply, fill #0
  Filled 2024-02-17 – 2024-03-06 (×2): qty 22, 88d supply, fill #0
  Filled 2024-05-22: qty 22, 88d supply, fill #1

## 2024-02-02 NOTE — Progress Notes (Signed)
 Subjective:  Patient ID: Kayla Preston, female    DOB: 05/15/1950  Age: 73 y.o. MRN: [id number]  CC: Medical Management of Chronic Issues     Discussed the use of AI scribe software for clinical note transcription with the patient, who gave verbal consent to proceed.  History of Present Illness Kayla Preston is a 73 year old female with a history of prediabetes, hypertension, hypothyroidism (not on medication, SVT, hyperlipidemia who presents with a right finger paronychia.  She has been experiencing sensitivity and slight swelling in the right index  finger since December 16, 2023 when she presented to the ED and was diagnosed with paronychia, despite treatment with doxycycline . The finger feels as if the nail wants to detach. She manages it by keeping the area clean and bandaged.  She has had recent weight fluctuations, initially losing weight from 217 lbs to 208 lbs, then regaining to 216 lbs, attributed to emotional eating due to personal stressors. She practices intermittent fasting and is working on resuming healthier eating habits.  Her prediabetes is stable with an A1c of 6.0. Her current medications include hydrochlorothiazide , metoprolol , Crestor , Zetia , and Linzess  for constipation. She missed her medication this morning but plans to take it upon returning home. She has no major concerns aside from the finger issue and no pain with Linzess  use.    Past Medical History:  Diagnosis Date   Atrial fibrillation (HCC)    Atrial fibrillation (HCC)    she had ablation which resolved intermittent afib   Hypertension    Obesity    Seasonal allergies     Past Surgical History:  Procedure Laterality Date   ABLATION      Family History  Problem Relation Age of Onset   Heart disease Mother    Heart disease Father    Hyperlipidemia Sister    Thyroid  disease Neg Hx     Social History   Socioeconomic History   Marital status: Single    Spouse name: Not on file   Number  of children: Not on file   Years of education: Not on file   Highest education level: Not on file  Occupational History   Not on file  Tobacco Use   Smoking status: Never   Smokeless tobacco: Never  Substance and Sexual Activity   Alcohol use: No   Drug use: No   Sexual activity: Not Currently  Other Topics Concern   Not on file  Social History Narrative   ** Merged History Encounter **       Social Drivers of Health   Financial Resource Strain: Low Risk  (10/18/2023)   Overall Financial Resource Strain (CARDIA)    Difficulty of Paying Living Expenses: Not hard at all  Food Insecurity: No Food Insecurity (10/18/2023)   Hunger Vital Sign    Worried About Running Out of Food in the Last Year: Never true    Ran Out of Food in the Last Year: Never true  Transportation Needs: No Transportation Needs (10/18/2023)   PRAPARE - Administrator, Civil Service (Medical): No    Lack of Transportation (Non-Medical): No  Physical Activity: Sufficiently Active (10/18/2023)   Exercise Vital Sign    Days of Exercise per Week: 5 days    Minutes of Exercise per Session: 30 min  Stress: No Stress Concern Present (10/18/2023)   [employer] of Occupational Health - Occupational Stress Questionnaire    Feeling of Stress : Not at all  Social Connections: Unknown (  10/18/2023)   Social Connection and Isolation Panel    Frequency of Communication with Friends and Family: More than three times a week    Frequency of Social Gatherings with Friends and Family: Three times a week    Attends Religious Services: More than 4 times per year    Active Member of Clubs or Organizations: No    Attends Banker Meetings: Never    Marital Status: Patient declined    No Known Allergies  Outpatient Medications Prior to Visit  Medication Sig Dispense Refill   albuterol  (PROAIR  HFA) 108 (90 Base) MCG/ACT inhaler Inhale 2 puffs into the lungs every 6 (six) hours as needed. 6.7 g 1    aspirin EC 81 MG tablet Take 81 mg by mouth daily.     ezetimibe  (ZETIA ) 10 MG tablet Take 1 tablet (10 mg total) by mouth daily. 90 tablet 1   hydrochlorothiazide  (HYDRODIURIL ) 25 MG tablet Take 0.5 tablets (12.5 mg total) by mouth every other day.(Keep upcoming appointment for further refills) 45 tablet 1   linaclotide  (LINZESS ) 145 MCG CAPS capsule TAKE 1 CAPSULE BY MOUTH AT LEAST 30 MINUTES BEFORE THE FIRST MEAL OF THE DAY ON AN EMPTY STOMACH. 90 capsule 1   metoprolol  succinate (TOPROL -XL) 50 MG 24 hr tablet Take 1 tablet (50 mg total) by mouth daily. 90 tablet 1   rosuvastatin  (CRESTOR ) 20 MG tablet Take 1 tablet (20 mg total) by mouth daily. 90 tablet 3   doxycycline  (VIBRAMYCIN ) 100 MG capsule Take 1 capsule (100 mg total) by mouth 2 (two) times daily. (Patient not taking: Reported on 02/02/2024) 20 capsule 0   No facility-administered medications prior to visit.     ROS Review of Systems  Constitutional:  Negative for activity change and appetite change.  HENT:  Negative for sinus pressure and sore throat.   Respiratory:  Negative for chest tightness, shortness of breath and wheezing.   Cardiovascular:  Negative for chest pain and palpitations.  Gastrointestinal:  Negative for abdominal distention, abdominal pain and constipation.  Genitourinary: Negative.   Musculoskeletal: Negative.        See HPI  Psychiatric/Behavioral:  Negative for behavioral problems and dysphoric mood.     Objective:  BP 129/79   Pulse 76   Ht 5' 6 (1.676 m)   Wt 216 lb 3.2 oz (98.1 kg)   SpO2 99%   BMI 34.90 kg/m      02/02/2024    8:41 AM 12/16/2023    6:18 AM 12/16/2023    6:12 AM  BP/Weight  Systolic BP 129  824  Diastolic BP 79  83  Wt. (Lbs) 216.2 210   BMI 34.9 kg/m2 33.89 kg/m2       Physical Exam Constitutional:      Appearance: She is well-developed.  Cardiovascular:     Rate and Rhythm: Normal rate.     Heart sounds: Normal heart sounds. No murmur heard. Pulmonary:      Effort: Pulmonary effort is normal.     Breath sounds: Normal breath sounds. No wheezing or rales.  Chest:     Chest wall: No tenderness.  Abdominal:     General: Bowel sounds are normal. There is no distension.     Palpations: Abdomen is soft. There is no mass.     Tenderness: There is no abdominal tenderness.  Musculoskeletal:        General: Normal range of motion.     Right lower leg: No edema.  Left lower leg: No edema.     Comments: Slight edema and TTP of medial aspect and pulp of left index finger  Neurological:     Mental Status: She is alert and oriented to person, place, and time.  Psychiatric:        Mood and Affect: Mood normal.        Latest Ref Rng & Units 09/05/2023    9:14 AM 06/02/2023    9:33 AM 01/31/2023    9:20 AM  CMP  Glucose 70 - 99 mg/dL 94  91  89   BUN 8 - 27 mg/dL 9  12  11    Creatinine 0.57 - 1.00 mg/dL 9.10  9.15  9.15   Sodium 134 - 144 mmol/L 142  140  142   Potassium 3.5 - 5.2 mmol/L 4.4  4.4  4.0   Chloride 96 - 106 mmol/L 105  107  110   CO2 20 - 29 mmol/L 23  19  20    Calcium  8.7 - 10.3 mg/dL 9.7  9.3  9.3   Total Protein 6.0 - 8.5 g/dL 6.9  6.9  6.9   Total Bilirubin 0.0 - 1.2 mg/dL 0.3  0.3  0.3   Alkaline Phos 44 - 121 IU/L 93  113  97   AST 0 - 40 IU/L 21  21  15    ALT 0 - 32 IU/L 19  20  15      Lipid Panel     Component Value Date/Time   CHOL 169 09/05/2023 0914   TRIG 112 09/05/2023 0914   HDL 57 09/05/2023 0914   CHOLHDL 3.0 09/05/2023 0914   CHOLHDL 3.4 05/17/2016 1004   VLDL 18 05/17/2016 1004   LDLCALC 92 09/05/2023 0914    CBC    Component Value Date/Time   WBC 4.7 01/09/2023 0203   RBC 5.85 (H) 01/09/2023 0203   HGB 13.6 01/09/2023 0203   HGB 13.2 09/25/2021 0845   HCT 43.7 01/09/2023 0203   HCT 42.3 09/25/2021 0845   PLT 226 01/09/2023 0203   PLT 250 09/25/2021 0845   MCV 74.7 (L) 01/09/2023 0203   MCV 75 (L) 09/25/2021 0845   MCH 23.2 (L) 01/09/2023 0203   MCHC 31.1 01/09/2023 0203   RDW 16.2 (H)  01/09/2023 0203   RDW 16.0 (H) 09/25/2021 0845   LYMPHSABS 1.7 09/25/2021 0845   MONOABS 0.8 08/13/2020 2128   EOSABS 0.1 09/25/2021 0845   BASOSABS 0.0 09/25/2021 0845    Lab Results  Component Value Date   HGBA1C 6.0 02/02/2024       Assessment & Plan Paronychia, right finger 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 - Prescribe another round of antibiotics. - Advise soaking finger in warm water with Epsom salt 2-3 times daily.  Hypertension Blood pressure well-controlled. - Continue current antihypertensive regimen with hydrochlorothiazide  and metoprolol . -Counseled on blood pressure goal of less than 130/80, low-sodium, DASH diet, medication compliance, 150 minutes of moderate intensity exercise per week. Discussed medication compliance, adverse effects.   Prediabetes Prediabetes well-managed with A1c of 6.0. - Continue current management and dietary practices.  Obesity Weight decreased from 217 to 208 pounds then increased to 216 today. Motivated to continue weight loss. - Encourage continuation of healthy eating habits and weight loss efforts.      Meds ordered this encounter  Medications   ezetimibe  (ZETIA ) 10 MG tablet    Sig: Take 1 tablet (10 mg total) by mouth daily.  Dispense:  90 tablet    Refill:  1   hydrochlorothiazide  (HYDRODIURIL ) 25 MG tablet    Sig: Take 0.5 tablets (12.5 mg total) by mouth every other day.(Keep upcoming appointment for further refills)    Dispense:  45 tablet    Refill:  1   linaclotide  (LINZESS ) 145 MCG CAPS capsule    Sig: TAKE 1 CAPSULE BY MOUTH AT LEAST 30 MINUTES BEFORE THE FIRST MEAL OF THE DAY ON AN EMPTY STOMACH.    Dispense:  90 capsule    Refill:  1   metoprolol  succinate (TOPROL -XL) 50 MG 24 hr tablet    Sig: Take 1 tablet (50 mg total) by mouth daily.    Dispense:  90 tablet    Refill:  1   rosuvastatin  (CRESTOR ) 20 MG tablet    Sig: Take 1 tablet (20 mg total)  by mouth daily.    Dispense:  90 tablet    Refill:  1   doxycycline  (VIBRAMYCIN ) 100 MG capsule    Sig: Take 1 capsule (100 mg total) by mouth 2 (two) times daily.    Dispense:  20 capsule    Refill:  0    Follow-up: Return in about 6 months (around 08/01/2024) for Chronic medical conditions.       Corrina Sabin, MD, FAAFP. [facility] [place], [place] [phone]   02/02/2024, 12:09 PM

## 2024-02-02 NOTE — Patient Instructions (Signed)
 VISIT SUMMARY:  Today, you were seen for a right finger paronychia, which is an infection around the nail. We also discussed your hypertension, prediabetes, and weight management.  YOUR PLAN:  -PARONYCHIA, RIGHT FINGER: Paronychia is an infection around the nail that can cause tenderness and swelling. Since your condition has not improved with doxycycline , we will prescribe another round of antibiotics. Additionally, you should soak your finger in warm water with Epsom salt 2-3 times daily to help reduce the swelling and tenderness.  -HYPERTENSION: Hypertension, or high blood pressure, is well-controlled with your current medications. Please continue taking hydrochlorothiazide  and metoprolol  as prescribed.  -PREDIABETES: Prediabetes is a condition where blood sugar levels are higher than normal but not high enough to be classified as diabetes. Your prediabetes is well-managed with an A1c of 6.0. Continue with your current management and dietary practices.  -OBESITY: Obesity is a condition characterized by excessive body fat. You have made progress by reducing your weight from 217 to 208 pounds. Continue with your healthy eating habits and weight loss efforts.  INSTRUCTIONS:  Please follow up as needed if your symptoms do not improve or if you have any concerns. Continue with your current medications and lifestyle practices. If you experience any new symptoms or have questions, do not hesitate to contact our office.

## 2024-02-03 ENCOUNTER — Other Ambulatory Visit: Payer: Self-pay

## 2024-02-07 ENCOUNTER — Other Ambulatory Visit: Payer: Self-pay

## 2024-02-07 ENCOUNTER — Other Ambulatory Visit: Payer: Self-pay | Admitting: Family Medicine

## 2024-02-07 DIAGNOSIS — Z0000 Encounter for general adult medical examination without abnormal findings: Secondary | ICD-10-CM

## 2024-02-08 ENCOUNTER — Other Ambulatory Visit: Payer: Self-pay

## 2024-02-17 ENCOUNTER — Ambulatory Visit
Admission: RE | Admit: 2024-02-17 | Discharge: 2024-02-17 | Disposition: A | Discharge status: Home / Self Care | Source: Ambulatory Visit | Attending: Family Medicine | Admitting: Family Medicine

## 2024-02-17 ENCOUNTER — Other Ambulatory Visit: Payer: Self-pay

## 2024-02-17 DIAGNOSIS — Z1231 Encounter for screening mammogram for malignant neoplasm of breast: Secondary | ICD-10-CM | POA: Diagnosis not present

## 2024-02-17 DIAGNOSIS — Z0000 Encounter for general adult medical examination without abnormal findings: Secondary | ICD-10-CM

## 2024-02-22 ENCOUNTER — Ambulatory Visit: Payer: Self-pay | Admitting: Internal Medicine

## 2024-02-22 ENCOUNTER — Other Ambulatory Visit: Payer: Self-pay

## 2024-02-29 ENCOUNTER — Ambulatory Visit: Attending: Cardiology | Admitting: Cardiology

## 2024-02-29 ENCOUNTER — Encounter: Payer: Self-pay | Admitting: Cardiology

## 2024-02-29 VITALS — BP 136/84 | HR 76 | Ht 66.0 in | Wt 214.8 lb

## 2024-02-29 DIAGNOSIS — Z7722 Contact with and (suspected) exposure to environmental tobacco smoke (acute) (chronic): Secondary | ICD-10-CM | POA: Diagnosis not present

## 2024-02-29 DIAGNOSIS — E782 Mixed hyperlipidemia: Secondary | ICD-10-CM | POA: Insufficient documentation

## 2024-02-29 DIAGNOSIS — I10 Essential (primary) hypertension: Secondary | ICD-10-CM | POA: Diagnosis not present

## 2024-02-29 DIAGNOSIS — E7841 Elevated Lipoprotein(a): Secondary | ICD-10-CM | POA: Diagnosis not present

## 2024-02-29 NOTE — Patient Instructions (Signed)

## 2024-03-01 NOTE — Progress Notes (Signed)
 Cardiology Office Note:    Date:  03/01/2024   ID:  Kayla Preston, DOB 26-Feb-1951, MRN [id number]  PCP:  Delbert Clam, MD  Cardiologist:  Marynell Bies, DO  Electrophysiologist:  None   Referring MD: Delbert Clam, MD    I am doing well  History of Present Illness:    Kayla Preston is a 73 y.o. female with a hx of Hypertension, Hyperlipidemia.  She previously followed with Dr. Burnard and is transitioning her care to me because he has retired. This is her first visit with me.    Recent lifestyle changes include emotional eating due to stress, leading to late-night eating and morning fullness. She has resumed her regular schedule and gardening, resulting in a two-pound weight loss since her last visit.  No recent chest pain or shortness of breath. Exposure to secondhand smoke from a neighbor occasionally causes shortness of breath and some chest discomfort.     Past Medical History:  Diagnosis Date   Atrial fibrillation (HCC)    Atrial fibrillation (HCC)    she had ablation which resolved intermittent afib   Hypertension    Obesity    Seasonal allergies     Past Surgical History:  Procedure Laterality Date   ABLATION      Current Medications: Current Meds  Medication Sig   albuterol  (PROAIR  HFA) 108 (90 Base) MCG/ACT inhaler Inhale 2 puffs into the lungs every 6 (six) hours as needed.   aspirin EC 81 MG tablet Take 81 mg by mouth daily.   ezetimibe  (ZETIA ) 10 MG tablet Take 1 tablet (10 mg total) by mouth daily.   hydrochlorothiazide  (HYDRODIURIL ) 25 MG tablet Take 0.5 tablets (12.5 mg total) by mouth every other day.(Keep upcoming appointment for further refills)   linaclotide  (LINZESS ) 145 MCG CAPS capsule TAKE 1 CAPSULE BY MOUTH AT LEAST 30 MINUTES BEFORE THE FIRST MEAL OF THE DAY ON AN EMPTY STOMACH.   metoprolol  succinate (TOPROL -XL) 50 MG 24 hr tablet Take 1 tablet (50 mg total) by mouth daily.   rosuvastatin  (CRESTOR ) 20 MG tablet Take 1 tablet (20 mg  total) by mouth daily.     Allergies:   Patient has no known allergies.   Social History   Socioeconomic History   Marital status: Single    Spouse name: Not on file   Number of children: Not on file   Years of education: Not on file   Highest education level: Not on file  Occupational History   Not on file  Tobacco Use   Smoking status: Never   Smokeless tobacco: Never  Substance and Sexual Activity   Alcohol use: No   Drug use: No   Sexual activity: Not Currently  Other Topics Concern   Not on file  Social History Narrative   ** Merged History Encounter **       Social Drivers of Health   Financial Resource Strain: Low Risk  (10/18/2023)   Overall Financial Resource Strain (CARDIA)    Difficulty of Paying Living Expenses: Not hard at all  Food Insecurity: No Food Insecurity (10/18/2023)   Hunger Vital Sign    Worried About Running Out of Food in the Last Year: Never true    Ran Out of Food in the Last Year: Never true  Transportation Needs: No Transportation Needs (10/18/2023)   PRAPARE - Administrator, Civil Service (Medical): No    Lack of Transportation (Non-Medical): No  Physical Activity: Sufficiently Active (10/18/2023)   Exercise Vital Sign  Days of Exercise per Week: 5 days    Minutes of Exercise per Session: 30 min  Stress: No Stress Concern Present (10/18/2023)   [employer] of Occupational Health - Occupational Stress Questionnaire    Feeling of Stress : Not at all  Social Connections: Unknown (10/18/2023)   Social Connection and Isolation Panel    Frequency of Communication with Friends and Family: More than three times a week    Frequency of Social Gatherings with Friends and Family: Three times a week    Attends Religious Services: More than 4 times per year    Active Member of Clubs or Organizations: No    Attends Banker Meetings: Never    Marital Status: Patient declined     Family History: The patient's family  history includes Heart disease in her father and mother; Hyperlipidemia in her sister. There is no history of Thyroid  disease.  ROS:   Review of Systems  Constitution: Negative for decreased appetite, fever and weight gain.  HENT: Negative for congestion, ear discharge, hoarse voice and sore throat.   Eyes: Negative for discharge, redness, vision loss in right eye and visual halos.  Cardiovascular: Negative for chest pain, dyspnea on exertion, leg swelling, orthopnea and palpitations.  Respiratory: Negative for cough, hemoptysis, shortness of breath and snoring.   Endocrine: Negative for heat intolerance and polyphagia.  Hematologic/Lymphatic: Negative for bleeding problem. Does not bruise/bleed easily.  Skin: Negative for flushing, nail changes, rash and suspicious lesions.  Musculoskeletal: Negative for arthritis, joint pain, muscle cramps, myalgias, neck pain and stiffness.  Gastrointestinal: Negative for abdominal pain, bowel incontinence, diarrhea and excessive appetite.  Genitourinary: Negative for decreased libido, genital sores and incomplete emptying.  Neurological: Negative for brief paralysis, focal weakness, headaches and loss of balance.  Psychiatric/Behavioral: Negative for altered mental status, depression and suicidal ideas.  Allergic/Immunologic: Negative for HIV exposure and persistent infections.    EKGs/Labs/Other Studies Reviewed:    The following studies were reviewed today:   EKG:  The ekg ordered today demonstrates   Recent Labs: 09/05/2023: ALT 19; BUN 9; Creatinine, Ser 0.89; Potassium 4.4; Sodium 142  Recent Lipid Panel    Component Value Date/Time   CHOL 169 09/05/2023 0914   TRIG 112 09/05/2023 0914   HDL 57 09/05/2023 0914   CHOLHDL 3.0 09/05/2023 0914   CHOLHDL 3.4 05/17/2016 1004   VLDL 18 05/17/2016 1004   LDLCALC 92 09/05/2023 0914    Physical Exam:    VS:  BP 136/84 (BP Location: Right Arm, Patient Position: Sitting, Cuff Size: Normal)    Pulse 76   Ht 5' 6 (1.676 m)   Wt 214 lb 12.8 oz (97.4 kg)   SpO2 96%   BMI 34.67 kg/m     Wt Readings from Last 3 Encounters:  02/29/24 214 lb 12.8 oz (97.4 kg)  02/02/24 216 lb 3.2 oz (98.1 kg)  12/16/23 210 lb (95.3 kg)     GEN: Well nourished, well developed in no acute distress HEENT: Normal NECK: No JVD; No carotid bruits LYMPHATICS: No lymphadenopathy CARDIAC: S1S2 noted,RRR, no murmurs, rubs, gallops RESPIRATORY:  Clear to auscultation without rales, wheezing or rhonchi  ABDOMEN: Soft, non-tender, non-distended, +bowel sounds, no guarding. EXTREMITIES: No edema, No cyanosis, no clubbing MUSCULOSKELETAL:  No deformity  SKIN: Warm and dry NEUROLOGIC:  Alert and oriented x 3, non-focal PSYCHIATRIC:  Normal affect, good insight  ASSESSMENT:    1. Essential hypertension   2. Mixed hyperlipidemia   3. Elevated Lp(a)  4. Secondhand smoke exposure    PLAN:    Hyperlipidemia -  Managed with Crestor  and Zetia . Genetic predisposition to cardiovascular issues due to elevated lipoprotein A. Medication and dietary changes effective. - Continue Crestor  and Zetia . - Encourage dietary modifications. - Advise family members to get tested for elevated lipoprotein A.  Hypertension - Continue hydrochlorothiazide  and metoprolol .  Secondhand smoke exposure - Planning to move to mitigate issue. - Advise use of a humidifier to reduce secondhand smoke exposure. - Support plan to move to a smoke-free environment.  The patient is in agreement with the above plan. The patient left the office in stable condition.  The patient will follow up in   Medication Adjustments/Labs and Tests Ordered: Current medicines are reviewed at length with the patient today.  Concerns regarding medicines are outlined above.  No orders of the defined types were placed in this encounter.  No orders of the defined types were placed in this encounter.   Patient Instructions  Medication Instructions:   Your physician recommends that you continue on your current medications as directed. Please refer to the Current Medication list given to you today.  *If you need a refill on your cardiac medications before your next appointment, please call your pharmacy*  Follow-Up: At [facility], you and your health needs are our priority.  As part of our continuing mission to provide you with exceptional heart care, our providers are all part of one team.  This team includes your primary Cardiologist (physician) and Advanced Practice Providers or APPs (Physician Assistants and Nurse Practitioners) who all work together to provide you with the care you need, when you need it.  Your next appointment:   1 year(s)  Provider:   Iva Posten, DO      Adopting a Healthy Lifestyle.  Know what a healthy weight is for you (roughly BMI <25) and aim to maintain this   Aim for 7+ servings of fruits and vegetables daily   65-80+ fluid ounces of water or unsweet tea for healthy kidneys   Limit to max 1 drink of alcohol per day; avoid smoking/tobacco   Limit animal fats in diet for cholesterol and heart health - choose grass fed whenever available   Avoid highly processed foods, and foods high in saturated/trans fats   Aim for low stress - take time to unwind and care for your mental health   Aim for 150 min of moderate intensity exercise weekly for heart health, and weights twice weekly for bone health   Aim for 7-9 hours of sleep daily   When it comes to diets, agreement about the perfect plan isnt easy to find, even among the experts. Experts at the [facility] [employer] developed an idea known as the Healthy Eating Plate. Just imagine a plate divided into logical, healthy portions.   The emphasis is on diet quality:   Load up on vegetables and fruits - one-half of your plate: Aim for color and variety, and remember that potatoes dont count.   Go for whole grains - one-quarter  of your plate: Whole wheat, barley, wheat berries, quinoa, oats, brown rice, and foods made with them. If you want pasta, go with whole wheat pasta.   Protein power - one-quarter of your plate: Fish, chicken, beans, and nuts are all healthy, versatile protein sources. Limit red meat.   The diet, however, does go beyond the plate, offering a few other suggestions.   Use healthy plant oils, such as  olive, canola, soy, corn, sunflower and peanut. Check the labels, and avoid partially hydrogenated oil, which have unhealthy trans fats.   If youre thirsty, drink water. Coffee and tea are good in moderation, but skip sugary drinks and limit milk and dairy products to one or two daily servings.   The type of carbohydrate in the diet is more important than the amount. Some sources of carbohydrates, such as vegetables, fruits, whole grains, and beans-are healthier than others.   Finally, stay active  Signed, Dub Huntsman, DO  03/01/2024 3:42 PM    [facility]

## 2024-03-06 ENCOUNTER — Other Ambulatory Visit: Payer: Self-pay

## 2024-03-08 ENCOUNTER — Other Ambulatory Visit: Payer: Self-pay

## 2024-03-23 ENCOUNTER — Other Ambulatory Visit: Payer: Self-pay

## 2024-03-27 ENCOUNTER — Other Ambulatory Visit: Payer: Self-pay

## 2024-04-10 ENCOUNTER — Other Ambulatory Visit: Payer: Self-pay

## 2024-04-18 ENCOUNTER — Other Ambulatory Visit: Payer: Self-pay

## 2024-04-23 ENCOUNTER — Other Ambulatory Visit: Payer: Self-pay

## 2024-04-24 ENCOUNTER — Other Ambulatory Visit: Payer: Self-pay

## 2024-05-22 ENCOUNTER — Other Ambulatory Visit: Payer: Self-pay

## 2024-06-15 ENCOUNTER — Other Ambulatory Visit: Payer: Self-pay

## 2024-08-01 ENCOUNTER — Ambulatory Visit: Admitting: Family Medicine

## 2024-10-23 ENCOUNTER — Ambulatory Visit
